# Patient Record
Sex: Male | Born: 1970 | Race: Black or African American | Hispanic: No | Marital: Single | Smoking: Former smoker
Health system: Southern US, Community
[De-identification: ages and names within clinical notes are randomized; demographics above are authoritative.]

## PROBLEM LIST (undated history)

## (undated) DIAGNOSIS — F32A Depression, unspecified: Secondary | ICD-10-CM

## (undated) DIAGNOSIS — F329 Major depressive disorder, single episode, unspecified: Secondary | ICD-10-CM

## (undated) DIAGNOSIS — R569 Unspecified convulsions: Secondary | ICD-10-CM

## (undated) DIAGNOSIS — F419 Anxiety disorder, unspecified: Secondary | ICD-10-CM

## (undated) HISTORY — PX: OTHER SURGICAL HISTORY: SHX169

---

## 2000-04-02 ENCOUNTER — Emergency Department (HOSPITAL_COMMUNITY): Admission: EM | Admit: 2000-04-02 | Discharge: 2000-04-02 | Payer: Self-pay | Admitting: Emergency Medicine

## 2000-10-02 ENCOUNTER — Encounter: Payer: Self-pay | Admitting: Emergency Medicine

## 2000-10-02 ENCOUNTER — Emergency Department (HOSPITAL_COMMUNITY): Admission: EM | Admit: 2000-10-02 | Discharge: 2000-10-02 | Payer: Self-pay | Admitting: Emergency Medicine

## 2001-01-09 ENCOUNTER — Emergency Department (HOSPITAL_COMMUNITY): Admission: EM | Admit: 2001-01-09 | Discharge: 2001-01-09 | Payer: Self-pay | Admitting: *Deleted

## 2001-02-20 ENCOUNTER — Emergency Department (HOSPITAL_COMMUNITY): Admission: EM | Admit: 2001-02-20 | Discharge: 2001-02-20 | Payer: Self-pay | Admitting: Emergency Medicine

## 2001-03-27 ENCOUNTER — Encounter: Payer: Self-pay | Admitting: Emergency Medicine

## 2001-03-27 ENCOUNTER — Emergency Department (HOSPITAL_COMMUNITY): Admission: EM | Admit: 2001-03-27 | Discharge: 2001-03-27 | Payer: Self-pay | Admitting: Emergency Medicine

## 2001-03-29 ENCOUNTER — Emergency Department (HOSPITAL_COMMUNITY): Admission: EM | Admit: 2001-03-29 | Discharge: 2001-03-29 | Payer: Self-pay | Admitting: Emergency Medicine

## 2001-04-01 ENCOUNTER — Emergency Department (HOSPITAL_COMMUNITY): Admission: EM | Admit: 2001-04-01 | Discharge: 2001-04-01 | Payer: Self-pay | Admitting: Emergency Medicine

## 2001-04-07 ENCOUNTER — Emergency Department (HOSPITAL_COMMUNITY): Admission: EM | Admit: 2001-04-07 | Discharge: 2001-04-07 | Payer: Self-pay | Admitting: Emergency Medicine

## 2001-04-18 ENCOUNTER — Emergency Department (HOSPITAL_COMMUNITY): Admission: EM | Admit: 2001-04-18 | Discharge: 2001-04-18 | Payer: Self-pay | Admitting: Emergency Medicine

## 2001-04-27 ENCOUNTER — Emergency Department (HOSPITAL_COMMUNITY): Admission: EM | Admit: 2001-04-27 | Discharge: 2001-04-27 | Payer: Self-pay | Admitting: Emergency Medicine

## 2001-04-28 ENCOUNTER — Emergency Department (HOSPITAL_COMMUNITY): Admission: EM | Admit: 2001-04-28 | Discharge: 2001-04-28 | Payer: Self-pay | Admitting: Emergency Medicine

## 2002-01-31 ENCOUNTER — Emergency Department (HOSPITAL_COMMUNITY): Admission: EM | Admit: 2002-01-31 | Discharge: 2002-01-31 | Payer: Self-pay | Admitting: Emergency Medicine

## 2002-02-07 ENCOUNTER — Emergency Department (HOSPITAL_COMMUNITY): Admission: EM | Admit: 2002-02-07 | Discharge: 2002-02-07 | Payer: Self-pay | Admitting: Emergency Medicine

## 2002-06-17 ENCOUNTER — Emergency Department (HOSPITAL_COMMUNITY): Admission: EM | Admit: 2002-06-17 | Discharge: 2002-06-17 | Payer: Self-pay | Admitting: *Deleted

## 2002-07-14 ENCOUNTER — Emergency Department (HOSPITAL_COMMUNITY): Admission: EM | Admit: 2002-07-14 | Discharge: 2002-07-14 | Payer: Self-pay | Admitting: Emergency Medicine

## 2002-07-31 ENCOUNTER — Emergency Department (HOSPITAL_COMMUNITY): Admission: EM | Admit: 2002-07-31 | Discharge: 2002-07-31 | Payer: Self-pay

## 2003-07-28 ENCOUNTER — Emergency Department (HOSPITAL_COMMUNITY): Admission: EM | Admit: 2003-07-28 | Discharge: 2003-07-28 | Payer: Self-pay | Admitting: Emergency Medicine

## 2003-12-04 ENCOUNTER — Emergency Department (HOSPITAL_COMMUNITY): Admission: EM | Admit: 2003-12-04 | Discharge: 2003-12-04 | Payer: Self-pay | Admitting: Emergency Medicine

## 2003-12-22 ENCOUNTER — Emergency Department (HOSPITAL_COMMUNITY): Admission: EM | Admit: 2003-12-22 | Discharge: 2003-12-22 | Payer: Self-pay | Admitting: Family Medicine

## 2004-09-04 ENCOUNTER — Emergency Department (HOSPITAL_COMMUNITY): Admission: EM | Admit: 2004-09-04 | Discharge: 2004-09-05 | Payer: Self-pay | Admitting: Emergency Medicine

## 2004-11-26 ENCOUNTER — Emergency Department (HOSPITAL_COMMUNITY): Admission: EM | Admit: 2004-11-26 | Discharge: 2004-11-26 | Payer: Self-pay | Admitting: Emergency Medicine

## 2005-02-04 ENCOUNTER — Emergency Department (HOSPITAL_COMMUNITY): Admission: EM | Admit: 2005-02-04 | Discharge: 2005-02-04 | Payer: Self-pay | Admitting: Emergency Medicine

## 2005-10-26 ENCOUNTER — Emergency Department (HOSPITAL_COMMUNITY): Admission: EM | Admit: 2005-10-26 | Discharge: 2005-10-26 | Payer: Self-pay | Admitting: Emergency Medicine

## 2007-03-29 ENCOUNTER — Emergency Department (HOSPITAL_COMMUNITY): Admission: EM | Admit: 2007-03-29 | Discharge: 2007-03-29 | Payer: Self-pay | Admitting: Emergency Medicine

## 2007-08-31 ENCOUNTER — Emergency Department (HOSPITAL_COMMUNITY): Admission: EM | Admit: 2007-08-31 | Discharge: 2007-08-31 | Payer: Self-pay | Admitting: Emergency Medicine

## 2007-12-26 ENCOUNTER — Emergency Department (HOSPITAL_COMMUNITY): Admission: EM | Admit: 2007-12-26 | Discharge: 2007-12-26 | Payer: Self-pay | Admitting: Emergency Medicine

## 2008-06-05 ENCOUNTER — Emergency Department (HOSPITAL_COMMUNITY): Admission: EM | Admit: 2008-06-05 | Discharge: 2008-06-05 | Payer: Self-pay | Admitting: Emergency Medicine

## 2008-06-08 ENCOUNTER — Emergency Department (HOSPITAL_COMMUNITY): Admission: EM | Admit: 2008-06-08 | Discharge: 2008-06-08 | Payer: Self-pay | Admitting: Emergency Medicine

## 2008-06-11 ENCOUNTER — Emergency Department (HOSPITAL_COMMUNITY): Admission: EM | Admit: 2008-06-11 | Discharge: 2008-06-11 | Payer: Self-pay | Admitting: Family Medicine

## 2010-03-12 ENCOUNTER — Emergency Department (HOSPITAL_COMMUNITY): Admission: EM | Admit: 2010-03-12 | Discharge: 2010-03-12 | Payer: Self-pay | Admitting: Emergency Medicine

## 2010-07-29 ENCOUNTER — Emergency Department (HOSPITAL_COMMUNITY): Admission: EM | Admit: 2010-07-29 | Discharge: 2010-07-29 | Payer: Self-pay | Admitting: Emergency Medicine

## 2010-08-08 ENCOUNTER — Emergency Department (HOSPITAL_COMMUNITY): Admission: EM | Admit: 2010-08-08 | Discharge: 2010-08-08 | Payer: Self-pay | Admitting: Emergency Medicine

## 2010-11-18 ENCOUNTER — Emergency Department (HOSPITAL_COMMUNITY)
Admission: EM | Admit: 2010-11-18 | Discharge: 2010-11-18 | Payer: Self-pay | Source: Home / Self Care | Admitting: Emergency Medicine

## 2011-01-05 ENCOUNTER — Emergency Department (HOSPITAL_COMMUNITY)
Admission: EM | Admit: 2011-01-05 | Discharge: 2011-01-05 | Disposition: A | Discharge status: Home / Self Care | Payer: Medicare Other | Attending: Emergency Medicine | Admitting: Emergency Medicine

## 2011-01-05 DIAGNOSIS — G40909 Epilepsy, unspecified, not intractable, without status epilepticus: Secondary | ICD-10-CM | POA: Insufficient documentation

## 2011-01-05 LAB — CBC
MCH: 29.6 pg (ref 26.0–34.0)
MCHC: 34.8 g/dL (ref 30.0–36.0)
Platelets: 223 10*3/uL (ref 150–400)
RDW: 13.9 % (ref 11.5–15.5)

## 2011-01-05 LAB — COMPREHENSIVE METABOLIC PANEL
ALT: 21 U/L (ref 0–53)
AST: 19 U/L (ref 0–37)
Albumin: 3.9 g/dL (ref 3.5–5.2)
CO2: 26 mEq/L (ref 19–32)
Calcium: 9.5 mg/dL (ref 8.4–10.5)
Creatinine, Ser: 0.97 mg/dL (ref 0.4–1.5)
GFR calc Af Amer: >60 mL/min (ref >60)
GFR calc non Af Amer: >60 mL/min (ref >60)
Sodium: 136 mEq/L (ref 135–145)
Total Protein: 7.4 g/dL (ref 6.0–8.3)

## 2011-01-05 LAB — URINALYSIS, ROUTINE W REFLEX MICROSCOPIC
Ketones, ur: NEGATIVE mg/dL
Leukocytes, UA: NEGATIVE
Protein, ur: NEGATIVE mg/dL
Urine Glucose, Fasting: NEGATIVE mg/dL
Urobilinogen, UA: 0.2 mg/dL (ref 0.0–1.0)

## 2011-01-05 LAB — DIFFERENTIAL
Basophils Absolute: 0 10*3/uL (ref 0.0–0.1)
Basophils Relative: 0 % (ref 0–1)
Eosinophils Absolute: 0.2 10*3/uL (ref 0.0–0.7)
Eosinophils Relative: 2 % (ref 0–5)
Monocytes Absolute: 0.6 10*3/uL (ref 0.1–1.0)
Monocytes Relative: 6 % (ref 3–12)
Neutro Abs: 6.8 10*3/uL (ref 1.7–7.7)

## 2011-01-05 LAB — RAPID URINE DRUG SCREEN, HOSP PERFORMED
Amphetamines: NOT DETECTED
Benzodiazepines: NOT DETECTED
Cocaine: NOT DETECTED

## 2011-01-07 ENCOUNTER — Emergency Department (HOSPITAL_COMMUNITY): Payer: Medicare Other

## 2011-01-07 ENCOUNTER — Emergency Department (HOSPITAL_COMMUNITY)
Admission: EM | Admit: 2011-01-07 | Discharge: 2011-01-07 | Disposition: A | Discharge status: Home / Self Care | Payer: Medicare Other | Attending: Emergency Medicine | Admitting: Emergency Medicine

## 2011-01-07 DIAGNOSIS — F329 Major depressive disorder, single episode, unspecified: Secondary | ICD-10-CM | POA: Insufficient documentation

## 2011-01-07 DIAGNOSIS — F3289 Other specified depressive episodes: Secondary | ICD-10-CM | POA: Insufficient documentation

## 2011-01-07 DIAGNOSIS — G40909 Epilepsy, unspecified, not intractable, without status epilepticus: Secondary | ICD-10-CM | POA: Insufficient documentation

## 2011-01-07 DIAGNOSIS — R319 Hematuria, unspecified: Secondary | ICD-10-CM | POA: Insufficient documentation

## 2011-01-07 LAB — POCT I-STAT, CHEM 8
BUN: 15 mg/dL (ref 6–23)
Creatinine, Ser: 1.1 mg/dL (ref 0.4–1.5)
Glucose, Bld: 83 mg/dL (ref 70–99)
Potassium: 7.8 mEq/L (ref 3.5–5.1)
Sodium: 135 mEq/L (ref 135–145)

## 2011-01-07 LAB — URINALYSIS, ROUTINE W REFLEX MICROSCOPIC
Bilirubin Urine: NEGATIVE
Ketones, ur: NEGATIVE mg/dL
Protein, ur: NEGATIVE mg/dL
Urine Glucose, Fasting: NEGATIVE mg/dL

## 2011-01-07 LAB — RAPID URINE DRUG SCREEN, HOSP PERFORMED
Amphetamines: NOT DETECTED
Barbiturates: NOT DETECTED
Benzodiazepines: NOT DETECTED
Opiates: NOT DETECTED

## 2011-01-07 LAB — PHENYTOIN LEVEL, TOTAL: Phenytoin Lvl: 9.6 ug/mL — ABNORMAL LOW (ref 10.0–20.0)

## 2011-01-07 LAB — POTASSIUM: Potassium: 3.8 mEq/L (ref 3.5–5.1)

## 2011-02-01 LAB — POCT I-STAT, CHEM 8
Calcium, Ion: 1.19 mmol/L (ref 1.12–1.32)
Chloride: 111 mEq/L (ref 96–112)
Creatinine, Ser: 1.1 mg/dL (ref 0.4–1.5)
Glucose, Bld: 88 mg/dL (ref 70–99)
HCT: 46 % (ref 39.0–52.0)
Hemoglobin: 15.6 g/dL (ref 13.0–17.0)
Potassium: 4.1 mEq/L (ref 3.5–5.1)
Sodium: 140 mEq/L (ref 135–145)

## 2011-02-01 LAB — CBC
Hemoglobin: 14.7 g/dL (ref 13.0–17.0)
MCHC: 33.6 g/dL (ref 30.0–36.0)
RBC: 5.04 MIL/uL (ref 4.22–5.81)
WBC: 9 10*3/uL (ref 4.0–10.5)

## 2011-02-01 LAB — PHENYTOIN LEVEL, TOTAL: Phenytoin Lvl: 12.8 ug/mL (ref 10.0–20.0)

## 2011-02-04 LAB — POCT I-STAT, CHEM 8
Calcium, Ion: 1.24 mmol/L (ref 1.12–1.32)
Chloride: 109 mEq/L (ref 96–112)
Creatinine, Ser: 0.9 mg/dL (ref 0.4–1.5)
Glucose, Bld: 89 mg/dL (ref 70–99)
HCT: 44 % (ref 39.0–52.0)
Potassium: 4.7 mEq/L (ref 3.5–5.1)

## 2011-08-28 ENCOUNTER — Emergency Department (HOSPITAL_COMMUNITY)
Admission: EM | Admit: 2011-08-28 | Discharge: 2011-08-28 | Disposition: A | Discharge status: Home / Self Care | Payer: Medicare Other | Attending: Emergency Medicine | Admitting: Emergency Medicine

## 2011-08-28 ENCOUNTER — Emergency Department (HOSPITAL_COMMUNITY): Payer: Medicare Other

## 2011-08-28 DIAGNOSIS — W1809XA Striking against other object with subsequent fall, initial encounter: Secondary | ICD-10-CM | POA: Insufficient documentation

## 2011-08-28 DIAGNOSIS — S0100XA Unspecified open wound of scalp, initial encounter: Secondary | ICD-10-CM | POA: Insufficient documentation

## 2011-08-28 DIAGNOSIS — M542 Cervicalgia: Secondary | ICD-10-CM | POA: Insufficient documentation

## 2011-08-28 DIAGNOSIS — R569 Unspecified convulsions: Secondary | ICD-10-CM | POA: Insufficient documentation

## 2011-08-28 DIAGNOSIS — R51 Headache: Secondary | ICD-10-CM | POA: Insufficient documentation

## 2011-08-28 DIAGNOSIS — S0990XA Unspecified injury of head, initial encounter: Secondary | ICD-10-CM | POA: Insufficient documentation

## 2011-08-28 LAB — CBC
MCH: 29.2 pg (ref 26.0–34.0)
MCHC: 34.4 g/dL (ref 30.0–36.0)
MCV: 85 fL (ref 78.0–100.0)
Platelets: 234 10*3/uL (ref 150–400)
RBC: 4.93 MIL/uL (ref 4.22–5.81)
RDW: 14.1 % (ref 11.5–15.5)

## 2011-08-28 LAB — POCT I-STAT, CHEM 8
Creatinine, Ser: 1.1 mg/dL (ref 0.50–1.35)
Glucose, Bld: 87 mg/dL (ref 70–99)
Hemoglobin: 15.3 g/dL (ref 13.0–17.0)
Potassium: 4.1 mEq/L (ref 3.5–5.1)

## 2011-08-28 LAB — DIFFERENTIAL
Eosinophils Absolute: 0.4 10*3/uL (ref 0.0–0.7)
Eosinophils Relative: 4 % (ref 0–5)
Lymphs Abs: 3.4 10*3/uL (ref 0.7–4.0)
Monocytes Absolute: 0.8 10*3/uL (ref 0.1–1.0)
Monocytes Relative: 7 % (ref 3–12)

## 2011-09-06 ENCOUNTER — Emergency Department (HOSPITAL_COMMUNITY)
Admission: EM | Admit: 2011-09-06 | Discharge: 2011-09-06 | Disposition: A | Discharge status: Home / Self Care | Payer: Medicare Other | Attending: Emergency Medicine | Admitting: Emergency Medicine

## 2011-09-06 DIAGNOSIS — G40909 Epilepsy, unspecified, not intractable, without status epilepticus: Secondary | ICD-10-CM | POA: Insufficient documentation

## 2011-09-06 DIAGNOSIS — S0100XA Unspecified open wound of scalp, initial encounter: Secondary | ICD-10-CM | POA: Insufficient documentation

## 2011-09-06 DIAGNOSIS — X58XXXA Exposure to other specified factors, initial encounter: Secondary | ICD-10-CM | POA: Insufficient documentation

## 2011-09-06 DIAGNOSIS — Z79899 Other long term (current) drug therapy: Secondary | ICD-10-CM | POA: Insufficient documentation

## 2011-09-06 DIAGNOSIS — F3289 Other specified depressive episodes: Secondary | ICD-10-CM | POA: Insufficient documentation

## 2011-09-06 DIAGNOSIS — F329 Major depressive disorder, single episode, unspecified: Secondary | ICD-10-CM | POA: Insufficient documentation

## 2011-09-06 DIAGNOSIS — Z09 Encounter for follow-up examination after completed treatment for conditions other than malignant neoplasm: Secondary | ICD-10-CM | POA: Insufficient documentation

## 2012-01-14 DIAGNOSIS — G40119 Localization-related (focal) (partial) symptomatic epilepsy and epileptic syndromes with simple partial seizures, intractable, without status epilepticus: Secondary | ICD-10-CM | POA: Diagnosis not present

## 2012-01-14 DIAGNOSIS — G40309 Generalized idiopathic epilepsy and epileptic syndromes, not intractable, without status epilepticus: Secondary | ICD-10-CM | POA: Diagnosis not present

## 2012-01-14 DIAGNOSIS — Z79899 Other long term (current) drug therapy: Secondary | ICD-10-CM | POA: Diagnosis not present

## 2012-01-14 DIAGNOSIS — G40209 Localization-related (focal) (partial) symptomatic epilepsy and epileptic syndromes with complex partial seizures, not intractable, without status epilepticus: Secondary | ICD-10-CM | POA: Diagnosis not present

## 2012-01-17 DIAGNOSIS — G40219 Localization-related (focal) (partial) symptomatic epilepsy and epileptic syndromes with complex partial seizures, intractable, without status epilepticus: Secondary | ICD-10-CM | POA: Diagnosis not present

## 2012-01-17 DIAGNOSIS — Z79899 Other long term (current) drug therapy: Secondary | ICD-10-CM | POA: Diagnosis not present

## 2012-01-17 DIAGNOSIS — G40119 Localization-related (focal) (partial) symptomatic epilepsy and epileptic syndromes with simple partial seizures, intractable, without status epilepticus: Secondary | ICD-10-CM | POA: Diagnosis not present

## 2012-01-17 DIAGNOSIS — G40309 Generalized idiopathic epilepsy and epileptic syndromes, not intractable, without status epilepticus: Secondary | ICD-10-CM | POA: Diagnosis not present

## 2012-01-17 DIAGNOSIS — G40209 Localization-related (focal) (partial) symptomatic epilepsy and epileptic syndromes with complex partial seizures, not intractable, without status epilepticus: Secondary | ICD-10-CM | POA: Diagnosis not present

## 2012-01-31 DIAGNOSIS — F0630 Mood disorder due to known physiological condition, unspecified: Secondary | ICD-10-CM | POA: Diagnosis not present

## 2012-02-07 DIAGNOSIS — R3129 Other microscopic hematuria: Secondary | ICD-10-CM | POA: Diagnosis not present

## 2012-05-15 ENCOUNTER — Emergency Department (HOSPITAL_COMMUNITY): Payer: Medicare Other

## 2012-05-15 ENCOUNTER — Emergency Department (HOSPITAL_COMMUNITY)
Admission: EM | Admit: 2012-05-15 | Discharge: 2012-05-15 | Disposition: A | Discharge status: Home / Self Care | Payer: Medicare Other | Attending: Emergency Medicine | Admitting: Emergency Medicine

## 2012-05-15 ENCOUNTER — Encounter (HOSPITAL_COMMUNITY): Payer: Self-pay | Admitting: Emergency Medicine

## 2012-05-15 DIAGNOSIS — R569 Unspecified convulsions: Secondary | ICD-10-CM | POA: Diagnosis not present

## 2012-05-15 DIAGNOSIS — Z79899 Other long term (current) drug therapy: Secondary | ICD-10-CM | POA: Diagnosis not present

## 2012-05-15 DIAGNOSIS — S0100XA Unspecified open wound of scalp, initial encounter: Secondary | ICD-10-CM | POA: Diagnosis not present

## 2012-05-15 DIAGNOSIS — Y92009 Unspecified place in unspecified non-institutional (private) residence as the place of occurrence of the external cause: Secondary | ICD-10-CM | POA: Insufficient documentation

## 2012-05-15 DIAGNOSIS — W19XXXA Unspecified fall, initial encounter: Secondary | ICD-10-CM | POA: Insufficient documentation

## 2012-05-15 DIAGNOSIS — R561 Post traumatic seizures: Secondary | ICD-10-CM | POA: Diagnosis not present

## 2012-05-15 DIAGNOSIS — G40909 Epilepsy, unspecified, not intractable, without status epilepticus: Secondary | ICD-10-CM | POA: Diagnosis not present

## 2012-05-15 DIAGNOSIS — T148XXA Other injury of unspecified body region, initial encounter: Secondary | ICD-10-CM | POA: Diagnosis not present

## 2012-05-15 DIAGNOSIS — R220 Localized swelling, mass and lump, head: Secondary | ICD-10-CM | POA: Diagnosis not present

## 2012-05-15 DIAGNOSIS — R51 Headache: Secondary | ICD-10-CM | POA: Diagnosis not present

## 2012-05-15 DIAGNOSIS — S0101XA Laceration without foreign body of scalp, initial encounter: Secondary | ICD-10-CM

## 2012-05-15 DIAGNOSIS — R221 Localized swelling, mass and lump, neck: Secondary | ICD-10-CM | POA: Diagnosis not present

## 2012-05-15 HISTORY — DX: Unspecified convulsions: R56.9

## 2012-05-15 LAB — COMPREHENSIVE METABOLIC PANEL
BUN: 11 mg/dL (ref 6–23)
Calcium: 9.7 mg/dL (ref 8.4–10.5)
Creatinine, Ser: 0.98 mg/dL (ref 0.50–1.35)
GFR calc Af Amer: >90 mL/min (ref >90)
Glucose, Bld: 90 mg/dL (ref 70–99)
Sodium: 137 mEq/L (ref 135–145)
Total Protein: 8.1 g/dL (ref 6.0–8.3)

## 2012-05-15 LAB — CBC
MCH: 28.8 pg (ref 26.0–34.0)
MCV: 84.8 fL (ref 78.0–100.0)
Platelets: 228 10*3/uL (ref 150–400)
RBC: 5.28 MIL/uL (ref 4.22–5.81)

## 2012-05-15 LAB — ETHANOL: Alcohol, Ethyl (B): <11 mg/dL (ref 0–11)

## 2012-05-15 LAB — DIFFERENTIAL
Eosinophils Absolute: 0.2 10*3/uL (ref 0.0–0.7)
Eosinophils Relative: 3 % (ref 0–5)
Lymphs Abs: 2.1 10*3/uL (ref 0.7–4.0)
Monocytes Relative: 9 % (ref 3–12)

## 2012-05-15 LAB — CARBAMAZEPINE LEVEL, TOTAL: Carbamazepine Lvl: <0.5 ug/mL — ABNORMAL LOW (ref 4.0–12.0)

## 2012-05-15 LAB — PHENYTOIN LEVEL, TOTAL: Phenytoin Lvl: 20.8 ug/mL — ABNORMAL HIGH (ref 10.0–20.0)

## 2012-05-15 MED ORDER — LIDOCAINE-EPINEPHRINE 2 %-1:100000 IJ SOLN
20.0000 mL | Freq: Once | INTRAMUSCULAR | Status: AC
  Administered 2012-05-15: 20 mL via INTRADERMAL
  Filled 2012-05-15: qty 20

## 2012-05-15 NOTE — ED Provider Notes (Signed)
History     CSN: [id number]  Arrival date & time 05/15/12  1212   First MD Initiated Contact with Patient 05/15/12 1220      Chief Complaint  Patient presents with  . Seizures     HPI Patient presents to emergency room after having a seizure. He has a history of seizure disorder and states he takes several medications. Patient also wears a seizure home but unfortunately he was not wearing that today. He was at a gas station when a witness the patient fall down and began having generalized shaking. Patient sustained a laceration the back of his head. He initially was confused but has become more alert as time has progressed.  He states he has a couple of seizures daily. Denies missing any medications however cannot tell me the name of them at this point. Denies any headache, neck pain back pain chest pain or abdominal pain. He denies any recent fevers coughing or illness. Past Medical History  Diagnosis Date  . Seizures     No past surgical history on file.  History reviewed. No pertinent family history.  History  Substance Use Topics  . Smoking status: Not on file  . Smokeless tobacco: Not on file  . Alcohol Use: No      Review of Systems  All other systems reviewed and are negative.    Allergies  Review of patient's allergies indicates no known allergies.  Home Medications  No current outpatient prescriptions on file.  BP 134/77  Pulse 66  Temp 98.4 F (36.9 C) (Oral)  Resp 16  SpO2 96%  Physical Exam  Nursing note and vitals reviewed. Constitutional: He appears well-developed and well-nourished. No distress.  HENT:  Head: Normocephalic and atraumatic.    Right Ear: External ear normal.  Left Ear: External ear normal.       Cephalhematoma and 2 lacerations posterior scalp  Eyes: Conjunctivae are normal. Right eye exhibits no discharge. Left eye exhibits no discharge. No scleral icterus.  Neck: Neck supple. No tracheal deviation present.    Cardiovascular: Normal rate, regular rhythm and intact distal pulses.   Pulmonary/Chest: Effort normal and breath sounds normal. No stridor. No respiratory distress. He has no wheezes. He has no rales.  Abdominal: Soft. Bowel sounds are normal. He exhibits no distension. There is no tenderness. There is no rebound and no guarding.  Musculoskeletal: He exhibits no edema and no tenderness.       Cervical back: Normal.       Thoracic back: Normal.       Lumbar back: Normal.  Neurological: He is alert. He has normal strength. No sensory deficit. Cranial nerve deficit:  no gross defecits noted. He exhibits normal muscle tone. He displays no seizure activity. Coordination normal.  Skin: Skin is warm and dry. No rash noted.  Psychiatric: He has a normal mood and affect.    ED Course  LACERATION REPAIR Performed by: Celene Kras Authorized by: Linwood Dibbles R Consent: Verbal consent obtained. Consent given by: patient Body area: head/neck Location details: scalp Laceration length: 8 (2 lacerations, 4 cm each) cm Foreign bodies: no foreign bodies Tendon involvement: none Nerve involvement: none Vascular damage: no Anesthesia: local infiltration Local anesthetic: lidocaine 2% with epinephrine Anesthetic total: 6 ml Patient sedated: no Preparation: Patient was prepped and draped in the usual sterile fashion. Irrigation solution: saline Amount of cleaning: standard Debridement: none Skin closure: staples Number of sutures: 12   (including critical care time)  Labs Reviewed  COMPREHENSIVE METABOLIC PANEL - Abnormal; Notable for the following:    Alkaline Phosphatase 127 (*)     All other components within normal limits  PHENYTOIN LEVEL, TOTAL - Abnormal; Notable for the following:    Phenytoin Lvl 20.8 (*)     All other components within normal limits  CARBAMAZEPINE LEVEL, TOTAL - Abnormal; Notable for the following:    Carbamazepine Lvl <0.5 (*)     All other components within  normal limits  GLUCOSE, CAPILLARY - Abnormal; Notable for the following:    Glucose-Capillary 103 (*)     All other components within normal limits  CBC  DIFFERENTIAL  ETHANOL  URINE RAPID DRUG SCREEN (HOSP PERFORMED)  VALPROIC ACID LEVEL   Ct Head Wo Contrast  05/15/2012  *RADIOLOGY REPORT*  Clinical Data: Seizure, headache.  CT HEAD WITHOUT CONTRAST  Technique:  Contiguous axial images were obtained from the base of the skull through the vertex without contrast.  Comparison: 08/28/2011  Findings: Soft tissue swelling over the right occipital region. Small amount of air within the scalp soft tissues.  No underlying calvarial abnormality. No acute intracranial abnormality. Specifically, no hemorrhage, hydrocephalus, mass lesion, acute infarction, or significant intracranial injury.  No acute calvarial abnormality. Visualized paranasal sinuses and mastoids clear. Orbital soft tissues unremarkable.  IMPRESSION: Soft tissue swelling and soft tissue gas within the right occipital soft tissues.  No intracranial abnormality.  Original Report Authenticated By: Cyndie Chime, M.D.      MDM  I've been able to review old records. Patient appears to be taking Dilantin and keppra according to a previous ED visit.  His Dilantin level appears to be therapeutic at this time. He's been monitored in the emergency department for several hours and has not had further seizures. Patient's laceration has been repaired. At this point he appears medically stable. At this time there does not appear to be any evidence of an acute emergency medical condition and the patient appears stable for discharge with appropriate outpatient follow up. I encouraged the patient to follow up with his physician to discuss his seizure medications.         Celene Kras, MD 05/15/12 [phone]

## 2012-05-15 NOTE — ED Notes (Signed)
Pt was fecal incontinent today.

## 2012-05-15 NOTE — ED Notes (Signed)
Pt had seizure today. Has them frequently and has usually a helmet. Pt has seizure and fell at gas station parking lot. Pt has 2 lacerations on back of his head. Headache. More oriented as the transport progressed per EMS

## 2012-05-15 NOTE — ED Notes (Signed)
Staples placed by EDP for lacerations to back of head.

## 2012-05-15 NOTE — ED Notes (Signed)
Patient transported to CT 

## 2012-05-15 NOTE — ED Notes (Signed)
Pt. Is still not able to use the restroom at this time.

## 2012-05-15 NOTE — Discharge Instructions (Signed)
Laceration Care, Adult A laceration is a cut or lesion that goes through all layers of the skin and into the tissue just beneath the skin. TREATMENT  Some lacerations may not require closure. Some lacerations may not be able to be closed due to an increased risk of infection. It is important to see your caregiver as soon as possible after an injury to minimize the risk of infection and maximize the opportunity for successful closure. If closure is appropriate, pain medicines may be given, if needed. The wound will be cleaned to help prevent infection. Your caregiver will use stitches (sutures), staples, wound glue (adhesive), or skin adhesive strips to repair the laceration. These tools bring the skin edges together to allow for faster healing and a better cosmetic outcome. However, all wounds will heal with a scar. Once the wound has healed, scarring can be minimized by covering the wound with sunscreen during the day for 1 full year. HOME CARE INSTRUCTIONS  For sutures or staples:  Keep the wound clean and dry.   If you were given a bandage (dressing), you should change it at least once a day. Also, change the dressing if it becomes wet or dirty, or as directed by your caregiver.   Wash the wound with soap and water 2 times a day. Rinse the wound off with water to remove all soap. Pat the wound dry with a clean towel.   After cleaning, apply a thin layer of the antibiotic ointment as recommended by your caregiver. This will help prevent infection and keep the dressing from sticking.   You may shower as usual after the first 24 hours. Do not soak the wound in water until the sutures are removed.   Only take over-the-counter or prescription medicines for pain, discomfort, or fever as directed by your caregiver.   Get your sutures or staples removed as directed by your caregiver.  For skin adhesive strips:  Keep the wound clean and dry.   Do not get the skin adhesive strips wet. You may bathe  carefully, using caution to keep the wound dry.   If the wound gets wet, pat it dry with a clean towel.   Skin adhesive strips will fall off on their own. You may trim the strips as the wound heals. Do not remove skin adhesive strips that are still stuck to the wound. They will fall off in time.  For wound adhesive:  You may briefly wet your wound in the shower or bath. Do not soak or scrub the wound. Do not swim. Avoid periods of heavy perspiration until the skin adhesive has fallen off on its own. After showering or bathing, gently pat the wound dry with a clean towel.   Do not apply liquid medicine, cream medicine, or ointment medicine to your wound while the skin adhesive is in place. This may loosen the film before your wound is healed.   If a dressing is placed over the wound, be careful not to apply tape directly over the skin adhesive. This may cause the adhesive to be pulled off before the wound is healed.   Avoid prolonged exposure to sunlight or tanning lamps while the skin adhesive is in place. Exposure to ultraviolet light in the first year will darken the scar.   The skin adhesive will usually remain in place for 5 to 10 days, then naturally fall off the skin. Do not pick at the adhesive film.  You may need a tetanus shot if:  You   cannot remember when you had your last tetanus shot.   You have never had a tetanus shot.  If you get a tetanus shot, your arm may swell, get red, and feel warm to the touch. This is common and not a problem. If you need a tetanus shot and you choose not to have one, there is a rare chance of getting tetanus. Sickness from tetanus can be serious. SEEK MEDICAL CARE IF:   You have redness, swelling, or increasing pain in the wound.   You see a red line that goes away from the wound.   You have yellowish-white fluid (pus) coming from the wound.   You have a fever.   You notice a bad smell coming from the wound or dressing.   Your wound breaks  open before or after sutures have been removed.   You notice something coming out of the wound such as wood or glass.   Your wound is on your hand or foot and you cannot move a finger or toe.  SEEK IMMEDIATE MEDICAL CARE IF:   Your pain is not controlled with prescribed medicine.   You have severe swelling around the wound causing pain and numbness or a change in color in your arm, hand, leg, or foot.   Your wound splits open and starts bleeding.   You have worsening numbness, weakness, or loss of function of any joint around or beyond the wound.   You develop painful lumps near the wound or on the skin anywhere on your body.  MAKE SURE YOU:   Understand these instructions.   Will watch your condition.   Will get help right away if you are not doing well or get worse.  Document Released: 11/08/2005 Document Revised: 10/28/2011 Document Reviewed: 05/04/2011 [facility] Patient Information 2012 [place], [place].Epilepsy A seizure (convulsion) is a sudden change in brain function that causes a change in behavior, muscle activity, or ability to remain awake and alert. If a person has recurring seizures, this is called epilepsy. CAUSES  Epilepsy is a disorder with many possible causes. Anything that disturbs the normal pattern of brain cell activity can lead to seizures. Seizure can be caused from illness to brain damage to abnormal brain development. Epilepsy may develop because of:  An abnormality in brain wiring.   An imbalance of nerve signaling chemicals (neurotransmitters).   Some combination of these factors.  Scientists are learning an increasing amount about genetic causes of seizures. SYMPTOMS  The symptoms of a seizure can vary greatly from one person to another. These may include:  An aura, or warning that tells a person they are about to have a seizure.   Abnormal sensations, such as abnormal smell or seeing flashing lights.   Sudden, general body stiffness.   Rhythmic  jerking of the face, arm, or leg - on one or both sides.   Sudden change in consciousness.   The person may appear to be awake but not responding.   They may appear to be asleep but cannot be awakened.   Grimacing, chewing, lip smacking, or drooling.   Often there is a period of sleepiness after a seizure.  DIAGNOSIS  The description you give to your caregiver about what you experienced will help them understand your problems. Equally important is the description by any witnesses to your seizure. A physical exam, including a detailed neurological exam, is necessary. An EEG (electroencephalogram) is a painless test of your brain waves. In this test a diagram is created of  your brain waves. These diagrams can be interpreted by a specialist. Pictures of your brain are usually taken with:  An MRI.   A CT scan.  Lab tests may be done to look for:  Signs of infection.   Abnormal blood chemistry.  PREVENTION  There is no way to prevent the development of epilepsy. If you have seizures that are typically triggered by an event (such as flashing lights), try to avoid the trigger. This can help you avoid a seizure.  PROGNOSIS  Most people with epilepsy lead outwardly normal lives. While epilepsy cannot currently be cured, for some people it does eventually go away. Most seizures do not cause brain damage. It is not uncommon for people with epilepsy, especially children, to develop behavioral and emotional problems. These problems are sometimes the consequence of medicine for seizures or social stress. For some people with epilepsy, the risk of seizures restricts their independence and recreational activities. For example, some states refuse drivers licenses to people with epilepsy. Most women with epilepsy can become pregnant. They should discuss their epilepsy and the medicine they are taking with their caregivers. Women with epilepsy have a 90 percent or better chance of having a normal, healthy  baby. RISKS AND COMPLICATIONS  People with epilepsy are at increased risk of falls, accidents, and injuries. People with epilepsy are at special risk for two life-threatening conditions. These are status epilepticus and sudden unexplained death (extremely rare). Status epilepticus is a long lasting, continuous seizure that is a medical emergency. TREATMENT  Once epilepsy is diagnosed, it is important to begin treatment as soon as possible. For about 80 percent of those diagnosed with epilepsy, seizures can be controlled with modern medicines and surgical techniques. Some antiepileptic drugs can interfere with the effectiveness of oral contraceptives. In 1997, the FDA approved a pacemaker for the brain the (vagus nerve stimulator). This stimulator can be used for people with seizures that are not well-controlled by medicine. Studies have shown that in some cases, children may experience fewer seizures if they maintain a strict diet. The strict diet is called the ketogenic diet. This diet is rich in fats and low in carbohydrates. HOME CARE INSTRUCTIONS   Your caregiver will make recommendations about driving and safety in normal activities. Follow these carefully.   Take any medicine prescribed exactly as directed.   Do any blood tests requested to monitor the levels of your medicine.   The people you live and work with should know that you are prone to seizures. They should receive instructions on how to help you. In general, a witness to a seizure should:   Cushion your head and body.   Turn you on your side.   Avoid unnecessarily restraining you.   Not place anything inside your mouth.   Call for local emergency medical help if there is any question about what has occurred.   Keep a seizure diary. Record what you recall about any seizure, especially any possible trigger.   If your caregiver has given you a follow-up appointment, it is very important to keep that appointment. Not keeping  the appointment could result in permanent injury and disability. If there is any problem keeping the appointment, you must call back to this facility for assistance.  SEEK MEDICAL CARE IF:   You develop signs of infection or other illness. This might increase the risk of a seizure.   You seem to be having more frequent seizures.   Your seizure pattern is changing.  SEEK  IMMEDIATE MEDICAL CARE IF:   A seizure does not stop after a few moments.   A seizure causes any difficulty in breathing.   A seizure results in a very severe headache.   A seizure leaves you with the inability to speak or use a part of your body.  MAKE SURE YOU:   Understand these instructions.   Will watch your condition.   Will get help right away if you are not doing well or get worse.  Document Released: 11/08/2005 Document Revised: 10/28/2011 Document Reviewed: 06/14/2008 [facility] Patient Information 2012 [place], [place].

## 2012-05-15 NOTE — ED Notes (Signed)
XBJ:[id number]<NF> Expected date:05/15/12<BR> Expected time:11:54 AM<BR> Means of arrival:Ambulance<BR> Comments:<BR> 41yoM, seizure, post-ictal

## 2012-05-16 LAB — VALPROIC ACID LEVEL: Valproic Acid Lvl: <10 ug/mL — ABNORMAL LOW (ref 50.0–100.0)

## 2012-05-29 ENCOUNTER — Encounter (HOSPITAL_COMMUNITY): Payer: Self-pay | Admitting: Emergency Medicine

## 2012-05-29 ENCOUNTER — Emergency Department (HOSPITAL_COMMUNITY)
Admission: EM | Admit: 2012-05-29 | Discharge: 2012-05-29 | Disposition: A | Discharge status: Home / Self Care | Payer: Medicare Other | Attending: Emergency Medicine | Admitting: Emergency Medicine

## 2012-05-29 DIAGNOSIS — Z4802 Encounter for removal of sutures: Secondary | ICD-10-CM | POA: Diagnosis not present

## 2012-05-29 NOTE — ED Provider Notes (Signed)
History    This chart was scribed for Andrew Baker, MD, MD by Smitty Pluck. The patient was seen in room TR10C and the patient's care was started at 3:16PM.   CSN: [id number]  Arrival date & time 05/29/12  1122   None     Chief Complaint  Patient presents with  . Suture / Staple Removal    (Consider location/radiation/quality/duration/timing/severity/associated sxs/prior treatment) Patient is a 41 y.o. male presenting with suture removal. The history is provided by the patient.  Suture / Staple Removal    Andrew [name] is a 41 y.o. male who presents to the Emergency Department complaining of staple removal from head placed 10 days ago at [facility]. Denies any other pain.   Past Medical History  Diagnosis Date  . Seizures     History reviewed. No pertinent past surgical history.  History reviewed. No pertinent family history.  History  Substance Use Topics  . Smoking status: Not on file  . Smokeless tobacco: Not on file  . Alcohol Use: No      Review of Systems  Constitutional: Negative for fever and chills.  Respiratory: Negative for shortness of breath.   Gastrointestinal: Negative for nausea and vomiting.  Neurological: Negative for weakness.    Allergies  Review of patient's allergies indicates no known allergies.  Home Medications   Current Outpatient Rx  Name Route Sig Dispense Refill  . LEVETIRACETAM 500 MG PO TABS Oral Take 1,500 mg by mouth every 12 (twelve) hours.    Marland Kitchen PHENYTOIN SODIUM EXTENDED 100 MG PO CAPS Oral Take 200 mg by mouth 2 (two) times daily.    . TOPIRAMATE 100 MG PO TABS Oral Take 100 mg by mouth 2 (two) times daily. Take with Topamax 50mg  to equal 150mg  dose.    . TOPIRAMATE 50 MG PO TABS Oral Take 50 mg by mouth 2 (two) times daily. Take with Topamax 100mg  to equal 150mg  dose.      BP 127/91  Pulse 70  Temp 99.2 F (37.3 C) (Oral)  Resp 18  SpO2 98%  Physical Exam  Nursing note and vitals reviewed. Constitutional: He is  oriented to person, place, and time. He appears well-developed and well-nourished. No distress.  HENT:  Head: Normocephalic and atraumatic.       scalp wound  staples intact  no drainage  Healing wound     Eyes: EOM are normal. Pupils are equal, round, and reactive to light.  Neck: Normal range of motion. Neck supple. No tracheal deviation present.  Cardiovascular: Normal rate.   Pulmonary/Chest: Effort normal. No respiratory distress.  Abdominal: Soft. He exhibits no distension.  Musculoskeletal: Normal range of motion.  Neurological: He is alert and oriented to person, place, and time.  Skin: Skin is warm and dry.  Psychiatric: He has a normal mood and affect. His behavior is normal.    ED Course  Procedures (including critical care time) DIAGNOSTIC STUDIES: Oxygen Saturation is 98% on room air, normal by my interpretation.    COORDINATION OF CARE:    Labs Reviewed - No data to display No results found.   No diagnosis found.    MDM  Staples removed by nursing  I personally performed the services described in this documentation, which was scribed in my presence. The recorded information has been reviewed and considered.         Andrew Baker, MD 05/29/12 1525

## 2012-05-29 NOTE — ED Notes (Signed)
Pt here for suture removal from head placed at [facility]

## 2012-05-29 NOTE — ED Notes (Signed)
Staples removed area clean and dry.

## 2012-07-03 DIAGNOSIS — G40219 Localization-related (focal) (partial) symptomatic epilepsy and epileptic syndromes with complex partial seizures, intractable, without status epilepticus: Secondary | ICD-10-CM | POA: Diagnosis not present

## 2012-07-03 DIAGNOSIS — G40309 Generalized idiopathic epilepsy and epileptic syndromes, not intractable, without status epilepticus: Secondary | ICD-10-CM | POA: Diagnosis not present

## 2012-07-03 DIAGNOSIS — G40119 Localization-related (focal) (partial) symptomatic epilepsy and epileptic syndromes with simple partial seizures, intractable, without status epilepticus: Secondary | ICD-10-CM | POA: Diagnosis not present

## 2012-07-03 DIAGNOSIS — G40209 Localization-related (focal) (partial) symptomatic epilepsy and epileptic syndromes with complex partial seizures, not intractable, without status epilepticus: Secondary | ICD-10-CM | POA: Diagnosis not present

## 2012-07-31 DIAGNOSIS — F0630 Mood disorder due to known physiological condition, unspecified: Secondary | ICD-10-CM | POA: Diagnosis not present

## 2012-12-26 ENCOUNTER — Emergency Department (HOSPITAL_COMMUNITY)
Admission: EM | Admit: 2012-12-26 | Discharge: 2012-12-26 | Disposition: A | Discharge status: Home / Self Care | Payer: Medicare Other | Attending: Emergency Medicine | Admitting: Emergency Medicine

## 2012-12-26 ENCOUNTER — Encounter (HOSPITAL_COMMUNITY): Payer: Self-pay | Admitting: Family Medicine

## 2012-12-26 DIAGNOSIS — G40909 Epilepsy, unspecified, not intractable, without status epilepticus: Secondary | ICD-10-CM | POA: Diagnosis not present

## 2012-12-26 DIAGNOSIS — R7989 Other specified abnormal findings of blood chemistry: Secondary | ICD-10-CM | POA: Insufficient documentation

## 2012-12-26 DIAGNOSIS — S01511A Laceration without foreign body of lip, initial encounter: Secondary | ICD-10-CM

## 2012-12-26 DIAGNOSIS — Y92009 Unspecified place in unspecified non-institutional (private) residence as the place of occurrence of the external cause: Secondary | ICD-10-CM | POA: Insufficient documentation

## 2012-12-26 DIAGNOSIS — R296 Repeated falls: Secondary | ICD-10-CM | POA: Insufficient documentation

## 2012-12-26 DIAGNOSIS — F411 Generalized anxiety disorder: Secondary | ICD-10-CM | POA: Diagnosis not present

## 2012-12-26 DIAGNOSIS — S01501A Unspecified open wound of lip, initial encounter: Secondary | ICD-10-CM | POA: Insufficient documentation

## 2012-12-26 DIAGNOSIS — Z79899 Other long term (current) drug therapy: Secondary | ICD-10-CM | POA: Insufficient documentation

## 2012-12-26 DIAGNOSIS — F329 Major depressive disorder, single episode, unspecified: Secondary | ICD-10-CM | POA: Diagnosis not present

## 2012-12-26 DIAGNOSIS — F3289 Other specified depressive episodes: Secondary | ICD-10-CM | POA: Insufficient documentation

## 2012-12-26 DIAGNOSIS — R569 Unspecified convulsions: Secondary | ICD-10-CM | POA: Diagnosis not present

## 2012-12-26 DIAGNOSIS — Y9301 Activity, walking, marching and hiking: Secondary | ICD-10-CM | POA: Insufficient documentation

## 2012-12-26 HISTORY — DX: Anxiety disorder, unspecified: F41.9

## 2012-12-26 HISTORY — DX: Major depressive disorder, single episode, unspecified: F32.9

## 2012-12-26 HISTORY — DX: Depression, unspecified: F32.A

## 2012-12-26 LAB — GLUCOSE, CAPILLARY: Glucose-Capillary: 185 mg/dL — ABNORMAL HIGH (ref 70–99)

## 2012-12-26 LAB — COMPREHENSIVE METABOLIC PANEL
AST: 13 U/L (ref 0–37)
BUN: 15 mg/dL (ref 6–23)
CO2: 25 mEq/L (ref 19–32)
Chloride: 100 mEq/L (ref 96–112)
Creatinine, Ser: 0.99 mg/dL (ref 0.50–1.35)
GFR calc non Af Amer: >90 mL/min (ref >90)
Total Bilirubin: 0.2 mg/dL — ABNORMAL LOW (ref 0.3–1.2)

## 2012-12-26 LAB — RAPID URINE DRUG SCREEN, HOSP PERFORMED
Cocaine: NOT DETECTED
Opiates: NOT DETECTED

## 2012-12-26 MED ORDER — POTASSIUM CHLORIDE CRYS ER 20 MEQ PO TBCR
40.0000 meq | EXTENDED_RELEASE_TABLET | Freq: Once | ORAL | Status: AC
  Administered 2012-12-26: 40 meq via ORAL
  Filled 2012-12-26: qty 2

## 2012-12-26 NOTE — ED Notes (Signed)
JYN:[id number]<AO> Expected date:12/26/12<BR> Expected time:12:17 AM<BR> Means of arrival:Ambulance<BR> Comments:<BR> seizure

## 2012-12-26 NOTE — ED Notes (Signed)
Pt's mother is his contact, Hilda Lias [phone], unable to come to the hospital now but wants to be updated

## 2012-12-26 NOTE — ED Notes (Signed)
Mom called to check on patient, will call her at discharge

## 2012-12-26 NOTE — ED Notes (Addendum)
Per EMS, patient was walking back to his room when he had a seizure. Mother told EMS that patient was walking, had a seizure and fell face first against the wall. Patient immobilized on LSB. CBG enroute by EMS was 121.

## 2012-12-26 NOTE — ED Provider Notes (Signed)
History     CSN: [id number]  Arrival date & time 12/26/12  0026   First MD Initiated Contact with Patient 12/26/12 0140      Chief Complaint  Patient presents with  . Seizures  . Lip Laceration   HPI  History provided by the patient. Patient is a 42 year old male with history of anxiety, depression and seizure disorder who presents after a seizure. Patient was at home and reportedly had a seizure while walking back to his bedroom. Seizure was witnessed by his mother. Seizure was described as his typical seizures. Patient fell forward with small laceration to his lip. EMS was called and patient was immobilized in a c-collar and transported to the emergency room. Patient states that he has frequent breakthrough seizures that are often caused from temperature change or stress. He denies having any alcohol or drugs. He states he has been taking his Dilantin and Keppra regularly as prescribed. Patient currently denies any significant pain or other injuries. Denies headache, lightheadedness or dizziness. Denies any weakness or numbness in extremities. Denies any back or neck pains. Denies any chest pain or shortness of breath. Denies any other aggravating or alleviating factors. Denies any other associated symptoms.    Past Medical History  Diagnosis Date  . Seizures   . Anxiety   . Depression     No past surgical history on file.  No family history on file.  History  Substance Use Topics  . Smoking status: Not on file  . Smokeless tobacco: Not on file  . Alcohol Use: No      Review of Systems  HENT: Negative for neck pain and neck stiffness.   Respiratory: Negative for cough.   Cardiovascular: Negative for chest pain.  Musculoskeletal: Negative for back pain.  Neurological: Positive for seizures. Negative for weakness, light-headedness, numbness and headaches.  All other systems reviewed and are negative.    Allergies  Review of patient's allergies indicates no known  allergies.  Home Medications   Current Outpatient Rx  Name  Route  Sig  Dispense  Refill  . BUSPIRONE HCL 15 MG PO TABS   Oral   Take 15 mg by mouth 3 (three) times daily.         Marland Kitchen CITALOPRAM HYDROBROMIDE 40 MG PO TABS   Oral   Take 40 mg by mouth daily.         Marland Kitchen LEVETIRACETAM 500 MG PO TABS   Oral   Take 1,500 mg by mouth every 12 (twelve) hours.         Marland Kitchen PHENYTOIN SODIUM EXTENDED 100 MG PO CAPS   Oral   Take 200 mg by mouth 2 (two) times daily. Take 2 capsules (200mg ) in the morning Take 2 capsules plus one 50mg  tablet at bedtime to make a total of 250mg .         . PHENYTOIN 50 MG PO CHEW   Oral   Chew 50 mg by mouth at bedtime. Take at bedtime along with 2 dilantin  100mg  capsules to make a total of 250mg          . TOPIRAMATE 100 MG PO TABS   Oral   Take 100 mg by mouth 2 (two) times daily. Take with Topamax 50mg  to equal 150mg  dose.         . TOPIRAMATE 50 MG PO TABS   Oral   Take 50 mg by mouth 2 (two) times daily. Take with Topamax 100mg  to equal 150mg  dose.  BP 114/67  Pulse 69  Temp 98.1 F (36.7 C) (Oral)  Resp 18  SpO2 98%  Physical Exam  Nursing note and vitals reviewed. Constitutional: He is oriented to person, place, and time. He appears well-developed and well-nourished. No distress.  HENT:  Head: Normocephalic.  Mouth/Throat: Oropharynx is clear and moist.       Small laceration to left lower lip without significant gap. Moderate swelling of the lip. No loose or fractured teeth. No other facial swelling or injury. No battle sign or raccoon eyes.  Eyes: Conjunctivae normal and EOM are normal. Pupils are equal, round, and reactive to light.  Neck: Normal range of motion. Neck supple.       Immobilized in c-collar. No cervical midline tenderness. Normal range of motion without pain or tenderness. Nexus criteria met.  Cardiovascular: Normal rate and regular rhythm.   Pulmonary/Chest: Effort normal and breath sounds normal. No  respiratory distress. He has no wheezes.  Abdominal: Soft. There is no tenderness. There is no rebound and no guarding.  Musculoskeletal: Normal range of motion. He exhibits no edema and no tenderness.  Neurological: He is alert and oriented to person, place, and time. He has normal strength. No cranial nerve deficit or sensory deficit.  Skin: Skin is warm.  Psychiatric: He has a normal mood and affect. His behavior is normal.    ED Course  Procedures   Results for orders placed during the hospital encounter of 12/26/12  GLUCOSE, CAPILLARY      Component Value Range   Glucose-Capillary 185 (*) 70 - 99 mg/dL  PHENYTOIN LEVEL, TOTAL      Component Value Range   Phenytoin Lvl 23.0 (*) 10.0 - 20.0 ug/mL  URINE RAPID DRUG SCREEN (HOSP PERFORMED)      Component Value Range   Opiates NONE DETECTED  NONE DETECTED   Cocaine NONE DETECTED  NONE DETECTED   Benzodiazepines NONE DETECTED  NONE DETECTED   Amphetamines NONE DETECTED  NONE DETECTED   Tetrahydrocannabinol POSITIVE (*) NONE DETECTED   Barbiturates NONE DETECTED  NONE DETECTED  COMPREHENSIVE METABOLIC PANEL      Component Value Range   Sodium 134 (*) 135 - 145 mEq/L   Potassium 3.4 (*) 3.5 - 5.1 mEq/L   Chloride 100  96 - 112 mEq/L   CO2 25  19 - 32 mEq/L   Glucose, Bld 107 (*) 70 - 99 mg/dL   BUN 15  6 - 23 mg/dL   Creatinine, Ser 1.61  0.50 - 1.35 mg/dL   Calcium 8.8  8.4 - 09.6 mg/dL   Total Protein 7.4  6.0 - 8.3 g/dL   Albumin 3.4 (*) 3.5 - 5.2 g/dL   AST 13  0 - 37 U/L   ALT 13  0 - 53 U/L   Alkaline Phosphatase 115  39 - 117 U/L   Total Bilirubin 0.2 (*) 0.3 - 1.2 mg/dL   GFR calc non Af Amer >90  >90 mL/min   GFR calc Af Amer >90  >90 mL/min      1. Seizure   2. Lip laceration       MDM  Patient seen and evaluated. Patient awake and alert oriented x3. No confusion. Patient has small laceration to left lower lip with swelling. There is no significant gaping wound. Laceration does not extend through the  vermilion border. Teeth normal without injury.  Patient reports having history of frequent breakthrough seizures. He states he is a related to temperature and stress. He  denies any alcohol or drug use. He has been taking his Dilantin and Keppra as prescribed. He denies any pain or other injuries. Denies any bite marks to the tongue. Denies any neck or back pains.  Patient continues to do well. Dilantin level slightly elevated. Will encourage patient to skip one dose. He has been instructed to continue followup with PCP. No other concerning findings today.      Angus Seller, [place] 12/26/12 [phone]

## 2012-12-26 NOTE — ED Notes (Signed)
Pt states that he will have marijuana in his urine and that's why he fell

## 2012-12-26 NOTE — ED Notes (Signed)
Pt has a hx of seizures and wears a helmet, he fell this evening face first, he has a busted lip on the left side, pt aware of the seizure and states that he has them everyday. Pt states that he does take his medications.

## 2012-12-27 NOTE — ED Provider Notes (Signed)
Medical screening examination/treatment/procedure(s) were performed by non-physician practitioner and as supervising physician I was immediately available for consultation/collaboration.  John-Adam Melisia Leming, M.D.     John-Adam Halden Phegley, MD 12/27/12 0012 

## 2013-01-07 ENCOUNTER — Emergency Department (HOSPITAL_COMMUNITY)
Admission: EM | Admit: 2013-01-07 | Discharge: 2013-01-07 | Discharge status: Against Medical Advice | Payer: Medicare Other

## 2013-03-01 DIAGNOSIS — R3129 Other microscopic hematuria: Secondary | ICD-10-CM | POA: Diagnosis not present

## 2013-03-12 DIAGNOSIS — M79609 Pain in unspecified limb: Secondary | ICD-10-CM | POA: Diagnosis not present

## 2013-03-12 DIAGNOSIS — B351 Tinea unguium: Secondary | ICD-10-CM | POA: Diagnosis not present

## 2013-03-27 ENCOUNTER — Emergency Department (HOSPITAL_COMMUNITY)
Admission: EM | Admit: 2013-03-27 | Discharge: 2013-03-27 | Disposition: A | Discharge status: Home / Self Care | Payer: Medicare Other | Attending: Emergency Medicine | Admitting: Emergency Medicine

## 2013-03-27 DIAGNOSIS — Z79899 Other long term (current) drug therapy: Secondary | ICD-10-CM | POA: Insufficient documentation

## 2013-03-27 DIAGNOSIS — F329 Major depressive disorder, single episode, unspecified: Secondary | ICD-10-CM | POA: Diagnosis not present

## 2013-03-27 DIAGNOSIS — H5789 Other specified disorders of eye and adnexa: Secondary | ICD-10-CM | POA: Diagnosis not present

## 2013-03-27 DIAGNOSIS — R55 Syncope and collapse: Secondary | ICD-10-CM | POA: Insufficient documentation

## 2013-03-27 DIAGNOSIS — G40909 Epilepsy, unspecified, not intractable, without status epilepticus: Secondary | ICD-10-CM | POA: Diagnosis not present

## 2013-03-27 DIAGNOSIS — F411 Generalized anxiety disorder: Secondary | ICD-10-CM | POA: Insufficient documentation

## 2013-03-27 DIAGNOSIS — R569 Unspecified convulsions: Secondary | ICD-10-CM | POA: Diagnosis not present

## 2013-03-27 DIAGNOSIS — F3289 Other specified depressive episodes: Secondary | ICD-10-CM | POA: Insufficient documentation

## 2013-03-27 LAB — COMPREHENSIVE METABOLIC PANEL
ALT: 16 U/L (ref 0–53)
AST: 20 U/L (ref 0–37)
Albumin: 3.5 g/dL (ref 3.5–5.2)
Alkaline Phosphatase: 120 U/L — ABNORMAL HIGH (ref 39–117)
Glucose, Bld: 85 mg/dL (ref 70–99)
Potassium: 3.7 mEq/L (ref 3.5–5.1)
Sodium: 139 mEq/L (ref 135–145)
Total Protein: 7.3 g/dL (ref 6.0–8.3)

## 2013-03-27 LAB — RAPID URINE DRUG SCREEN, HOSP PERFORMED
Amphetamines: NOT DETECTED
Cocaine: NOT DETECTED
Opiates: NOT DETECTED
Tetrahydrocannabinol: POSITIVE — AB

## 2013-03-27 LAB — URINALYSIS, ROUTINE W REFLEX MICROSCOPIC
Bilirubin Urine: NEGATIVE
Glucose, UA: NEGATIVE mg/dL
Specific Gravity, Urine: 1.014 (ref 1.005–1.030)
pH: 6.5 (ref 5.0–8.0)

## 2013-03-27 LAB — CBC WITH DIFFERENTIAL/PLATELET
Basophils Absolute: 0 10*3/uL (ref 0.0–0.1)
Basophils Relative: 0 % (ref 0–1)
Eosinophils Absolute: 0.1 10*3/uL (ref 0.0–0.7)
Lymphs Abs: 2.5 10*3/uL (ref 0.7–4.0)
MCH: 29.8 pg (ref 26.0–34.0)
Neutrophils Relative %: 75 % (ref 43–77)
Platelets: 219 10*3/uL (ref 150–400)
RBC: 4.66 MIL/uL (ref 4.22–5.81)
WBC: 13.1 10*3/uL — ABNORMAL HIGH (ref 4.0–10.5)

## 2013-03-27 LAB — ETHANOL: Alcohol, Ethyl (B): <11 mg/dL (ref 0–11)

## 2013-03-27 LAB — URINE MICROSCOPIC-ADD ON

## 2013-03-27 NOTE — ED Notes (Signed)
Per Effie Shy, MD pt stable to be d/c, pt ambulatory, pt wearing helmet, speaks in complete sentences, follows commands

## 2013-03-27 NOTE — ED Notes (Signed)
Vitals as taken in ambulance by [facility].

## 2013-03-27 NOTE — ED Notes (Signed)
Per EMS the patient had a seizure this morning, is currently post-ictal. Patient was able to report his last name, all other attempts to speak were "yes." Right sided gaze. Left eye very red and bloodshot, medial. 18g right AC. Last seen normal was last night. Seen in this condition at 0700. Hasn't eaten or had anything to drink today. BS 112.

## 2013-03-27 NOTE — ED Notes (Signed)
Patient's mother at the bedside. States that the seizure lasted a few seconds and that the patient has seizures every week, sometimes a few times per day. Joycelyn Das, MD, made aware and evaluated the patient. MD decided patient would still be discharged.

## 2013-03-27 NOTE — ED Provider Notes (Signed)
History     CSN: [id number]  Arrival date & time 03/27/13  1304   First MD Initiated Contact with Patient 03/27/13 1409      Chief Complaint  Patient presents with  . Seizures    (Consider location/radiation/quality/duration/timing/severity/associated sxs/prior treatment) HPI Pt with seizure d/o on 3 different antiepileptics followed by [facility]. Mother states pt was unresponsive this morning at 0710. She states she heard him fall but did not witness convulsive activity. Pt with improved responsiveness. Pt then had another episode which was not witnessed and was unresponsive afterwards. Mother called EMS. Pt remained unresponsive. Pt is now awake and near baseline. He c/o no pain in head, face neck. Move all ext. Admits to marijuana use. +compliant with meds. No recent illness.   Past Medical History  Diagnosis Date  . Seizures   . Anxiety   . Depression     No past surgical history on file.  No family history on file.  History  Substance Use Topics  . Smoking status: Not on file  . Smokeless tobacco: Not on file  . Alcohol Use: No      Review of Systems  Constitutional: Negative for fever and chills.  HENT: Negative for neck pain.   Eyes: Positive for redness. Negative for pain and visual disturbance.  Respiratory: Negative for shortness of breath.   Cardiovascular: Negative for chest pain.  Gastrointestinal: Negative for nausea, vomiting and abdominal pain.  Musculoskeletal: Negative for back pain.  Skin: Negative for rash and wound.  Neurological: Positive for seizures and syncope. Negative for dizziness, weakness, numbness and headaches.  All other systems reviewed and are negative.    Allergies  Review of patient's allergies indicates no known allergies.  Home Medications   Current Outpatient Rx  Name  Route  Sig  Dispense  Refill  . busPIRone (BUSPAR) 15 MG tablet   Oral   Take 15 mg by mouth 2 (two) times daily.          . citalopram  (CELEXA) 40 MG tablet   Oral   Take 40 mg by mouth daily.         Marland Kitchen levETIRAcetam (KEPPRA) 500 MG tablet   Oral   Take 1,500 mg by mouth every 12 (twelve) hours.         . phenytoin (DILANTIN) 100 MG ER capsule   Oral   Take 200 mg by mouth 2 (two) times daily.          Marland Kitchen topiramate (TOPAMAX) 100 MG tablet   Oral   Take 100 mg by mouth 2 (two) times daily. Take with Topamax 50mg  to equal 150mg  dose.         . topiramate (TOPAMAX) 50 MG tablet   Oral   Take 50 mg by mouth 2 (two) times daily. Take with Topamax 100mg  to equal 150mg  dose.           BP 138/91  Pulse 69  Temp(Src) 98.6 F (37 C) (Oral)  Resp 20  SpO2 99%  Physical Exam  Nursing note and vitals reviewed. Constitutional: He is oriented to person, place, and time. He appears well-developed and well-nourished. No distress.  HENT:  Head: Normocephalic and atraumatic.  Mouth/Throat: Oropharynx is clear and moist.  No facial bone tenderness or swelling  Eyes: EOM are normal. Pupils are equal, round, and reactive to light.  Left eye subconjunctival hemorrhage  Neck: Normal range of motion. Neck supple.  Cardiovascular: Normal rate and regular rhythm.  Pulmonary/Chest: Effort normal and breath sounds normal. No respiratory distress. He has no wheezes. He has no rales. He exhibits no tenderness.  Abdominal: Soft. Bowel sounds are normal. He exhibits no distension and no mass. There is no tenderness. There is no rebound and no guarding.  Musculoskeletal: Normal range of motion. He exhibits no edema and no tenderness.  Neurological: He is alert and oriented to person, place, and time.  5/5 motor in all ext, sensation intact  Skin: Skin is warm and dry. No rash noted. No erythema.  Psychiatric: He has a normal mood and affect. His behavior is normal.    ED Course  Procedures (including critical care time)  Labs Reviewed  CBC WITH DIFFERENTIAL  COMPREHENSIVE METABOLIC PANEL  PHENYTOIN LEVEL, TOTAL   URINALYSIS, ROUTINE W REFLEX MICROSCOPIC   No results found.   No diagnosis found.    MDM  Pt signed out to oncoming MD.         Loren Racer, MD 03/29/13 [phone]

## 2013-03-27 NOTE — ED Notes (Signed)
Pt. Alert and oriented x4. Denies pain. Neuro intact. No injury or trauma to tongue noted. RN noted red area in left eye- mother sts new finding.

## 2013-03-27 NOTE — ED Provider Notes (Signed)
Andrew [name] is a 42 y.o. male who is here for evaluation of multiple seizures today. He states that he has seizures fairly frequently. He is taking his usual medications. He was reevaluated at 17:25. There been no more seizures in the ED. He is calm, comfortable, and wishes to go home. He will call his mother to come and get him. The Dilantin level is normal. The repeat vital signs, serially are normal.  Patient Vitals for the past 24 hrs:  BP Temp Temp src Pulse Resp SpO2  03/27/13 1700 137/90 mmHg - - 66 20 100 %  03/27/13 1630 131/86 mmHg - - 65 19 99 %  03/27/13 1615 135/91 mmHg - - 68 20 100 %  03/27/13 1600 141/87 mmHg - - 70 20 100 %  03/27/13 1547 131/83 mmHg 98.7 F (37.1 C) Oral 66 14 99 %  03/27/13 1530 149/85 mmHg - - 71 24 100 %  03/27/13 1515 126/86 mmHg - - 71 25 97 %  03/27/13 1445 135/85 mmHg - - 66 20 99 %  03/27/13 1415 143/84 mmHg - - 68 21 100 %  03/27/13 1316 138/91 mmHg 98.6 F (37 C) Oral 69 20 99 %  03/27/13 1315 132/84 mmHg - - 66 22 100 %  03/27/13 1257 140/86 mmHg - - 70 16 -   Results for orders placed during the hospital encounter of 03/27/13  CBC WITH DIFFERENTIAL      Result Value Range   WBC 13.1 (*) 4.0 - 10.5 K/uL   RBC 4.66  4.22 - 5.81 MIL/uL   Hemoglobin 13.9  13.0 - 17.0 g/dL   HCT 16.1 (*) 09.6 - 04.5 %   MCV 81.5  78.0 - 100.0 fL   MCH 29.8  26.0 - 34.0 pg   MCHC 36.6 (*) 30.0 - 36.0 g/dL   RDW 40.9  81.1 - 91.4 %   Platelets 219  150 - 400 K/uL   Neutrophils Relative 75  43 - 77 %   Neutro Abs 9.7 (*) 1.7 - 7.7 K/uL   Lymphocytes Relative 19  12 - 46 %   Lymphs Abs 2.5  0.7 - 4.0 K/uL   Monocytes Relative 6  3 - 12 %   Monocytes Absolute 0.7  0.1 - 1.0 K/uL   Eosinophils Relative 1  0 - 5 %   Eosinophils Absolute 0.1  0.0 - 0.7 K/uL   Basophils Relative 0  0 - 1 %   Basophils Absolute 0.0  0.0 - 0.1 K/uL  COMPREHENSIVE METABOLIC PANEL      Result Value Range   Sodium 139  135 - 145 mEq/L   Potassium 3.7  3.5 - 5.1 mEq/L   Chloride 109  96 - 112 mEq/L   CO2 21  19 - 32 mEq/L   Glucose, Bld 85  70 - 99 mg/dL   BUN 8  6 - 23 mg/dL   Creatinine, Ser 7.82  0.50 - 1.35 mg/dL   Calcium 9.2  8.4 - 95.6 mg/dL   Total Protein 7.3  6.0 - 8.3 g/dL   Albumin 3.5  3.5 - 5.2 g/dL   AST 20  0 - 37 U/L   ALT 16  0 - 53 U/L   Alkaline Phosphatase 120 (*) 39 - 117 U/L   Total Bilirubin 0.2 (*) 0.3 - 1.2 mg/dL   GFR calc non Af Amer >90  >90 mL/min   GFR calc Af Amer >90  >90 mL/min  PHENYTOIN LEVEL,  TOTAL      Result Value Range   Phenytoin Lvl 11.4  10.0 - 20.0 ug/mL  URINALYSIS, ROUTINE W REFLEX MICROSCOPIC      Result Value Range   Color, Urine YELLOW  YELLOW   APPearance CLEAR  CLEAR   Specific Gravity, Urine 1.014  1.005 - 1.030   pH 6.5  5.0 - 8.0   Glucose, UA NEGATIVE  NEGATIVE mg/dL   Hgb urine dipstick MODERATE (*) NEGATIVE   Bilirubin Urine NEGATIVE  NEGATIVE   Ketones, ur NEGATIVE  NEGATIVE mg/dL   Protein, ur NEGATIVE  NEGATIVE mg/dL   Urobilinogen, UA 0.2  0.0 - 1.0 mg/dL   Nitrite NEGATIVE  NEGATIVE   Leukocytes, UA NEGATIVE  NEGATIVE  ETHANOL      Result Value Range   Alcohol, Ethyl (B) <11  0 - 11 mg/dL  URINE RAPID DRUG SCREEN (HOSP PERFORMED)      Result Value Range   Opiates NONE DETECTED  NONE DETECTED   Cocaine NONE DETECTED  NONE DETECTED   Benzodiazepines NONE DETECTED  NONE DETECTED   Amphetamines NONE DETECTED  NONE DETECTED   Tetrahydrocannabinol POSITIVE (*) NONE DETECTED   Barbiturates NONE DETECTED  NONE DETECTED  SALICYLATE LEVEL      Result Value Range   Salicylate Lvl <2.0 (*) 2.8 - 20.0 mg/dL  ACETAMINOPHEN LEVEL      Result Value Range   Acetaminophen (Tylenol), Serum <15.0  10 - 30 ug/mL  URINE MICROSCOPIC-ADD ON      Result Value Range   Squamous Epithelial / LPF RARE  RARE   WBC, UA 0-2  <3 WBC/hpf   RBC / HPF 7-10  <3 RBC/hpf    Assessment: Seizure in patient with epilepsy. He appears to be in appropriate doses as her medications. We are only able to level his  Dilantin. Doubt metabolic instability, serious bacterial infection or impending vascular collapse; the patient is stable for discharge.  Plan: Discharge continue usual medications. He is advised to follow with his neurologist if he has further episodes of seizures  Flint Melter, MD 03/27/13 1730

## 2013-03-28 ENCOUNTER — Telehealth: Payer: Self-pay | Admitting: *Deleted

## 2013-03-28 NOTE — Telephone Encounter (Signed)
Please let mom know I reviewed the ER visit. Everything looked good. It is not unusual to not "feel yourself" for several days after a seizure

## 2013-03-28 NOTE — Telephone Encounter (Signed)
Patient had several seizures yesterday and went to the ER. Mother states that everything was normal but was told to call the office today. The mother wants to know to the patient needs to see another kind of doctor or what should she do because the patient is not hisself? Please advise.

## 2013-03-28 NOTE — Telephone Encounter (Signed)
Spoke to patient mother and she is aware of what's going on now and patient is getting better.

## 2013-05-30 ENCOUNTER — Other Ambulatory Visit: Payer: Self-pay | Admitting: Neurology

## 2013-06-06 ENCOUNTER — Emergency Department (HOSPITAL_COMMUNITY)
Admission: EM | Admit: 2013-06-06 | Discharge: 2013-06-06 | Disposition: A | Discharge status: Home / Self Care | Payer: Medicare Other | Attending: Emergency Medicine | Admitting: Emergency Medicine

## 2013-06-06 ENCOUNTER — Emergency Department (HOSPITAL_COMMUNITY): Payer: Medicare Other

## 2013-06-06 DIAGNOSIS — S0100XA Unspecified open wound of scalp, initial encounter: Secondary | ICD-10-CM | POA: Diagnosis not present

## 2013-06-06 DIAGNOSIS — S0993XA Unspecified injury of face, initial encounter: Secondary | ICD-10-CM | POA: Diagnosis not present

## 2013-06-06 DIAGNOSIS — R51 Headache: Secondary | ICD-10-CM | POA: Diagnosis not present

## 2013-06-06 DIAGNOSIS — F3289 Other specified depressive episodes: Secondary | ICD-10-CM | POA: Insufficient documentation

## 2013-06-06 DIAGNOSIS — Z79899 Other long term (current) drug therapy: Secondary | ICD-10-CM | POA: Insufficient documentation

## 2013-06-06 DIAGNOSIS — S0190XA Unspecified open wound of unspecified part of head, initial encounter: Secondary | ICD-10-CM | POA: Diagnosis not present

## 2013-06-06 DIAGNOSIS — M542 Cervicalgia: Secondary | ICD-10-CM | POA: Diagnosis not present

## 2013-06-06 DIAGNOSIS — F329 Major depressive disorder, single episode, unspecified: Secondary | ICD-10-CM | POA: Insufficient documentation

## 2013-06-06 DIAGNOSIS — Y9289 Other specified places as the place of occurrence of the external cause: Secondary | ICD-10-CM | POA: Insufficient documentation

## 2013-06-06 DIAGNOSIS — F411 Generalized anxiety disorder: Secondary | ICD-10-CM | POA: Insufficient documentation

## 2013-06-06 DIAGNOSIS — Y9301 Activity, walking, marching and hiking: Secondary | ICD-10-CM | POA: Insufficient documentation

## 2013-06-06 DIAGNOSIS — T148XXA Other injury of unspecified body region, initial encounter: Secondary | ICD-10-CM | POA: Diagnosis not present

## 2013-06-06 DIAGNOSIS — G40909 Epilepsy, unspecified, not intractable, without status epilepticus: Secondary | ICD-10-CM | POA: Insufficient documentation

## 2013-06-06 DIAGNOSIS — S199XXA Unspecified injury of neck, initial encounter: Secondary | ICD-10-CM | POA: Diagnosis not present

## 2013-06-06 DIAGNOSIS — R569 Unspecified convulsions: Secondary | ICD-10-CM | POA: Diagnosis not present

## 2013-06-06 DIAGNOSIS — R296 Repeated falls: Secondary | ICD-10-CM | POA: Insufficient documentation

## 2013-06-06 LAB — PHENYTOIN LEVEL, TOTAL: Phenytoin Lvl: 10.9 ug/mL (ref 10.0–20.0)

## 2013-06-06 LAB — BASIC METABOLIC PANEL
BUN: 10 mg/dL (ref 6–23)
Calcium: 9.4 mg/dL (ref 8.4–10.5)
Creatinine, Ser: 0.93 mg/dL (ref 0.50–1.35)
GFR calc Af Amer: >90 mL/min (ref >90)
GFR calc non Af Amer: >90 mL/min (ref >90)

## 2013-06-06 LAB — CBC WITH DIFFERENTIAL/PLATELET
Basophils Absolute: 0 10*3/uL (ref 0.0–0.1)
Basophils Relative: 0 % (ref 0–1)
HCT: 40.9 % (ref 39.0–52.0)
MCHC: 35.2 g/dL (ref 30.0–36.0)
Monocytes Absolute: 0.7 10*3/uL (ref 0.1–1.0)
Neutro Abs: 4.9 10*3/uL (ref 1.7–7.7)
Neutrophils Relative %: 62 % (ref 43–77)
Platelets: 213 10*3/uL (ref 150–400)
RDW: 15.2 % (ref 11.5–15.5)
WBC: 7.8 10*3/uL (ref 4.0–10.5)

## 2013-06-06 MED ORDER — SODIUM CHLORIDE 0.9 % IV SOLN
500.0000 mg | Freq: Two times a day (BID) | INTRAVENOUS | Status: DC
  Administered 2013-06-06: 500 mg via INTRAVENOUS
  Filled 2013-06-06 (×2): qty 5

## 2013-06-06 NOTE — ED Notes (Signed)
Talkative, pleasant, alert x 3,

## 2013-06-06 NOTE — ED Notes (Signed)
To ED via [facility] medic [facility]-- from [facility] and [facility]. Witnessed seizure -- fell, hit head on ground and bleeding controlled approx 3/4" occipital area-- multiple abrasions to arms. Post ictal on scene-- alert on arrival

## 2013-06-06 NOTE — ED Provider Notes (Signed)
History    CSN: [id number] Arrival date & time 06/06/13  1158  First MD Initiated Contact with Patient 06/06/13 1200     Chief Complaint  Patient presents with  . Seizures   (Consider location/radiation/quality/duration/timing/severity/associated sxs/prior Treatment) HPI Comments: Patient is a 42 yo M PMHx significant for epilepsy, anxiety, and depression presents to the ED via EMS after a witnessed seizure PTA. According to EMS, the patient was walking when he had a seizure, fell hit his head on the sidewalk receiving a 3/4" laceration to occipital area w/ multiple abrasions. Patient was post-ictal upon EMS arrival, but is back to baseline in ED. Pt is complaining of sharp posterior headache w/ sharp posterior midline neck pain. He has no other physical complaints at this time. He states he has been taking his Dilantin and Keppra as prescribed by [facility].   Patient is a 42 y.o. male presenting with seizures. The history is provided by the patient and the EMS personnel.  Seizures Seizure activity on arrival: no    Past Medical History  Diagnosis Date  . Seizures   . Anxiety   . Depression    No past surgical history on file. No family history on file. History  Substance Use Topics  . Smoking status: Not on file  . Smokeless tobacco: Not on file  . Alcohol Use: No    Review of Systems  Constitutional: Negative for fever and chills.  HENT: Positive for neck pain and neck stiffness.   Eyes: Negative.   Respiratory: Negative for shortness of breath.   Cardiovascular: Negative for chest pain.  Gastrointestinal: Negative for nausea, vomiting and abdominal pain.  Genitourinary: Negative.   Musculoskeletal: Negative for back pain.  Skin: Positive for wound.  Neurological: Positive for seizures and headaches.    Allergies  Review of patient's allergies indicates no known allergies.  Home Medications   Current Outpatient Rx  Name  Route  Sig  Dispense  Refill   . busPIRone (BUSPAR) 15 MG tablet   Oral   Take 15 mg by mouth 2 (two) times daily.          . citalopram (CELEXA) 40 MG tablet   Oral   Take 40 mg by mouth daily.         Marland Kitchen levETIRAcetam (KEPPRA) 500 MG tablet   Oral   Take 1,500 mg by mouth 2 (two) times daily.         . phenytoin (DILANTIN) 100 MG ER capsule   Oral   Take 200 mg by mouth 2 (two) times daily.          Marland Kitchen topiramate (TOPAMAX) 100 MG tablet   Oral   Take 100 mg by mouth 2 (two) times daily. Take with Topamax 50mg  to equal 150mg  dose.         . topiramate (TOPAMAX) 50 MG tablet   Oral   Take 50 mg by mouth 2 (two) times daily. Take with Topamax 100mg  to equal 150mg  dose.          BP 152/97  Pulse 65  Temp(Src) 98.5 F (36.9 C) (Oral)  Resp 11  SpO2 100% Physical Exam  Constitutional: He is oriented to person, place, and time. He appears well-developed and well-nourished. No distress.  HENT:  Head: Normocephalic and atraumatic.  Mouth/Throat: Oropharynx is clear and moist.  Eyes: Conjunctivae and EOM are normal. Pupils are equal, round, and reactive to light.  Neck: Neck supple. Spinous process tenderness present.  Pulmonary/Chest:  Effort normal and breath sounds normal.  Abdominal: Soft. There is no tenderness.  Neurological: He is alert and oriented to person, place, and time. He has normal strength. No cranial nerve deficit or sensory deficit.  Skin: Skin is warm and dry.    ED Course  Procedures (including critical care time)  Medications  levETIRAcetam (KEPPRA) 500 mg in sodium chloride 0.9 % 100 mL IVPB (0 mg Intravenous Stopped 06/06/13 1400)     Labs Reviewed  PHENYTOIN LEVEL, TOTAL  BASIC METABOLIC PANEL  CBC WITH DIFFERENTIAL    Date: 06/06/2013  Rate: 65  Rhythm: normal sinus rhythm  QRS Axis: normal  Intervals: normal  ST/T Wave abnormalities: normal  Conduction Disutrbances:none  Narrative Interpretation:   Old EKG Reviewed: unchanged  LACERATION  REPAIR Performed by: Jeannetta Ellis Authorized by: Jeannetta Ellis Consent: Verbal consent obtained. Risks and benefits: risks, benefits and alternatives were discussed Consent given by: patient Patient identity confirmed: provided demographic data Prepped and Draped in normal sterile fashion Wound explored  Laceration Location: posterior scalp  Laceration Length: 0.5 cm  No Foreign Bodies seen or palpated  Anesthesia: local infiltration  Local anesthetic: none  Anesthetic total: 0 ml  Irrigation method: syringe Amount of cleaning: standard  Skin closure: staples  Number of sutures: 1  Technique: staples  Patient tolerance: Patient tolerated the procedure well with no immediate complications.   Ct Head Wo Contrast  06/06/2013   *RADIOLOGY REPORT*  Clinical Data:  Seizure with fall.  Headache with posterior head laceration.  CT HEAD WITHOUT CONTRAST CT CERVICAL SPINE WITHOUT CONTRAST  Technique:  Multidetector CT imaging of the head and cervical spine was performed following the standard protocol without intravenous contrast.  Multiplanar CT image reconstructions of the cervical spine were also generated.  Comparison:  Head CT 05/15/2012.  Cervical spine CT 08/28/2011.  CT HEAD  Findings: There is soft tissue swelling in the right occipital scalp.  No underlying calvarial fracture is demonstrated.  The mastoid air cells, middle ears and visualized paranasal sinuses are clear aside from a stable peripherally calcified mucous retention cyst in the right maxillary sinus.  There is no evidence of acute intracranial hemorrhage, mass lesion, brain edema or extra-axial fluid collection.  There is no hydrocephalus.  Mild asymmetry of the dural sinuses is stable.  IMPRESSION: No acute intracranial or calvarial findings.  Right occipital scalp soft tissue swelling.  CT CERVICAL SPINE  Findings: There is stable chronic fragmentation of the tip of the C2 spinous process.  The  alignment is stable with mild reversal of the usual cervical lordosis.  There is no focal angulation or listhesis.  There is no evidence of acute fracture.  Disc space loss and posterior osteophytes from C4-C5 through C6-C7 are similar to the prior study.  No acute soft tissue findings are demonstrated.  IMPRESSION: No evidence of acute cervical spine fracture, traumatic subluxation or static signs instability.  Stable spondylosis.   Original Report Authenticated By: Carey Bullocks, M.D.   Ct Cervical Spine Wo Contrast  06/06/2013   *RADIOLOGY REPORT*  Clinical Data:  Seizure with fall.  Headache with posterior head laceration.  CT HEAD WITHOUT CONTRAST CT CERVICAL SPINE WITHOUT CONTRAST  Technique:  Multidetector CT imaging of the head and cervical spine was performed following the standard protocol without intravenous contrast.  Multiplanar CT image reconstructions of the cervical spine were also generated.  Comparison:  Head CT 05/15/2012.  Cervical spine CT 08/28/2011.  CT HEAD  Findings: There is  soft tissue swelling in the right occipital scalp.  No underlying calvarial fracture is demonstrated.  The mastoid air cells, middle ears and visualized paranasal sinuses are clear aside from a stable peripherally calcified mucous retention cyst in the right maxillary sinus.  There is no evidence of acute intracranial hemorrhage, mass lesion, brain edema or extra-axial fluid collection.  There is no hydrocephalus.  Mild asymmetry of the dural sinuses is stable.  IMPRESSION: No acute intracranial or calvarial findings.  Right occipital scalp soft tissue swelling.  CT CERVICAL SPINE  Findings: There is stable chronic fragmentation of the tip of the C2 spinous process.  The alignment is stable with mild reversal of the usual cervical lordosis.  There is no focal angulation or listhesis.  There is no evidence of acute fracture.  Disc space loss and posterior osteophytes from C4-C5 through C6-C7 are similar to the prior  study.  No acute soft tissue findings are demonstrated.  IMPRESSION: No evidence of acute cervical spine fracture, traumatic subluxation or static signs instability.  Stable spondylosis.   Original Report Authenticated By: Carey Bullocks, M.D.   1. Epilepsy     MDM  Patient with known history of embolus he presented to the emergency department after a witnessed seizure earlier today. Patient endorses taking his Keppra and Dilantin as scheduled. Patient complained of posterior midline C-spine tenderness and headache. CT scan is reviewed. C-spine cleared collar removed. EKG and labs reviewed. Keppra bolus given. Patient ED for just about 3 hours without further seizure activity. She will be discharged with followup with his neurologist at [facility]. Patient agreeable to plan. Patient d/w with Dr. Ranae Palms, agrees with plan. Patient is stable at time of discharge     Jeannetta Ellis, PA-C 06/06/13 1600

## 2013-06-07 NOTE — ED Provider Notes (Signed)
Medical screening examination/treatment/procedure(s) were performed by non-physician practitioner and as supervising physician I was immediately available for consultation/collaboration.   Ranulfo Kall, MD 06/07/13 0711 

## 2013-07-16 ENCOUNTER — Ambulatory Visit: Payer: Self-pay | Admitting: Neurology

## 2013-07-17 ENCOUNTER — Ambulatory Visit: Payer: Self-pay | Admitting: Neurology

## 2013-07-26 ENCOUNTER — Encounter: Payer: Self-pay | Admitting: Neurology

## 2013-07-26 ENCOUNTER — Ambulatory Visit (INDEPENDENT_AMBULATORY_CARE_PROVIDER_SITE_OTHER): Payer: Medicare Other | Admitting: Neurology

## 2013-07-26 VITALS — BP 116/77 | HR 72 | Ht 69.5 in | Wt 200.0 lb

## 2013-07-26 DIAGNOSIS — R569 Unspecified convulsions: Secondary | ICD-10-CM | POA: Insufficient documentation

## 2013-07-26 DIAGNOSIS — F411 Generalized anxiety disorder: Secondary | ICD-10-CM | POA: Diagnosis not present

## 2013-07-26 DIAGNOSIS — F419 Anxiety disorder, unspecified: Secondary | ICD-10-CM

## 2013-07-26 MED ORDER — LACOSAMIDE 50 MG PO TABS: 100.0000 mg | ORAL_TABLET | Freq: Two times a day (BID) | ORAL | Status: DC

## 2013-07-26 NOTE — Progress Notes (Signed)
History of Present Illness:   Andrew [name], 42 year-old black male returns today for followup for seizure. Last seen by Andrew [name] in Feb 2014.      He has a history of intractable seizure disorder since 42 years old. He has been followed in the office since 1990. 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 He has had EMG monitoring at [facility] in the past that included abnormal baseline EEG due to intermittent predominantly right temporal spike, sharp right,and focal slowing.  He continues to refuse surgery to remove irritable focus.  Over the years, he has tried different combination, he has been on current medications  Topamax 100 mg twice a day, +50 mg twice a day, Dilantin 100 mg 2 tablets twice a day, Keppra 500 mg 3 tablets b.i.d, brand name name medications, he is also taking Buspirone 10 mg t.i.d., citalopram 20 mg every day for his depression and angry control,  He continues to have 2-3 seizures every day, complex partial with secondary generalization, he had burst open few helmet, during his seizure, he continued to smoke marijuana and cigarette.  He helps his mother taking care of his elderly bed ridden grandmother at home, has raging spells sometimes.  UPDATE Sep 4th 2014:  He denies significant side effects from medications, most recent Dilantin level was 23.7 in February 2014, Dilantin dosage was decreased from 250 twice a day to current 200 mg twice a day, he had 2 short lasting complex partial seizure at office today.    Review of Systems  Out of a complete 14 system review, the patient complains of only the following symptoms, and all other reviewed systems are negative.  Constitutional: Weight loss   Eyes: Blurred vision   Respiratory: Snoring   Neurological: Confusion   Seizure, snoring  Genitourinary: Urination problems    Depression, change in appetite  Social History Patient is disabled, high school education, resides in the home with his mom. No children. Never  married. Denies caffeine or alcohol, cigarettes a day, occasional marijuana use. Patient is left-handed. See Dr. Betti Cruz for psych issues  Inhaled Tobacco Use: Current every day smoker  Family History  Father died of an overdose at age 75. Mother living in good health  Past Medical History Intractable seizure disorder. behavior issues, seen by Dr. Betti Cruz  Surgical History  No surgery.  Physical Exam  General: well developed, well  nourished, seated, in no evident distress Head: wearing a helmet Neck: supple no carotid bruits Respiratory: clear to auscultation bilaterally Cardiovascular: regular rate rhythm  Neurologic Exam  Mental Status: pleasant, awake, alert, cooperative to history, talking, and casual conversation. profound gingiva hypertrophy, wear helmet Cranial Nerves: CN II-XII pupils were equal round reactive to light.    Extraocular movements were full.  Visual fields were full on confrontational test.  Facial sensation and strength were normal.  Hearing was intact to finger rubbing bilaterally.  Uvula tongue were midline.  Head turning and shoulder shrugging were normal and symmetric.  Tongue protrusion into the cheeks strength were normal.  Motor: Normal tone, bulk, and strength. Sensory: Normal to light touch Coordination: Normal finger-to-nose, heel-to-shin.  There was no dysmetria noticed. 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 Pt has refused surgery.    1. Continue Dilantin 200 mg BID. Check trough level in am 2,  Cont Topamax to 100mg  +50mg  bid. 3. Continue Keppra 1500mg  BID.  4. Add on Vimpat, titrating to 50mg  2 tab bid. 5. RTC with Andrew [name] in 6 months

## 2013-07-27 ENCOUNTER — Other Ambulatory Visit (INDEPENDENT_AMBULATORY_CARE_PROVIDER_SITE_OTHER): Payer: Self-pay

## 2013-07-27 ENCOUNTER — Ambulatory Visit: Payer: Medicare Other

## 2013-07-27 DIAGNOSIS — F411 Generalized anxiety disorder: Secondary | ICD-10-CM | POA: Diagnosis not present

## 2013-07-27 DIAGNOSIS — R569 Unspecified convulsions: Secondary | ICD-10-CM | POA: Diagnosis not present

## 2013-07-27 DIAGNOSIS — Z0289 Encounter for other administrative examinations: Secondary | ICD-10-CM

## 2013-07-27 NOTE — Addendum Note (Signed)
Addended by: Elmon Kirschner on: 07/27/2013 08:16 AM   Modules accepted: Orders

## 2013-07-28 LAB — PHENYTOIN LEVEL, TOTAL: Phenytoin Lvl: 9.2 ug/mL — ABNORMAL LOW (ref 10.0–20.0)

## 2013-07-28 NOTE — Progress Notes (Signed)
Quick Note:  Please call patient, dilantin level is 9.2, mildly low, keep treatment plan as discussed in appointment, no change in dilantin dosage. ______

## 2013-07-30 DIAGNOSIS — F0630 Mood disorder due to known physiological condition, unspecified: Secondary | ICD-10-CM | POA: Diagnosis not present

## 2013-08-02 NOTE — Progress Notes (Signed)
Quick Note:  Spoke to patient's mother and relayed Dilantin level, no change in dosage, per Dr. Terrace Arabia. ______

## 2013-08-27 ENCOUNTER — Other Ambulatory Visit: Payer: Self-pay | Admitting: Neurology

## 2013-08-28 ENCOUNTER — Telehealth: Payer: Self-pay | Admitting: Neurology

## 2013-08-28 NOTE — Telephone Encounter (Signed)
Patient's mother came to office and said that Pharmacy denied patient's Keppra, Dilantin and Topamax.  I checked with Dr. Terrace Arabia and she wants patient to continue those medicines, so I called and gave a verbal order with 6 refills.  I spoke to mother and relayed that medicines have been called in.  She was grateful.

## 2013-09-21 ENCOUNTER — Emergency Department (HOSPITAL_COMMUNITY): Payer: Medicare Other

## 2013-09-21 ENCOUNTER — Emergency Department (HOSPITAL_COMMUNITY)
Admission: EM | Admit: 2013-09-21 | Discharge: 2013-09-21 | Disposition: A | Discharge status: Home / Self Care | Payer: Medicare Other | Attending: Emergency Medicine | Admitting: Emergency Medicine

## 2013-09-21 ENCOUNTER — Encounter (HOSPITAL_COMMUNITY): Payer: Self-pay | Admitting: Emergency Medicine

## 2013-09-21 ENCOUNTER — Other Ambulatory Visit: Payer: Self-pay

## 2013-09-21 DIAGNOSIS — G40909 Epilepsy, unspecified, not intractable, without status epilepticus: Secondary | ICD-10-CM | POA: Insufficient documentation

## 2013-09-21 DIAGNOSIS — R091 Pleurisy: Secondary | ICD-10-CM | POA: Insufficient documentation

## 2013-09-21 DIAGNOSIS — Z79899 Other long term (current) drug therapy: Secondary | ICD-10-CM | POA: Insufficient documentation

## 2013-09-21 DIAGNOSIS — F411 Generalized anxiety disorder: Secondary | ICD-10-CM | POA: Insufficient documentation

## 2013-09-21 DIAGNOSIS — R071 Chest pain on breathing: Secondary | ICD-10-CM | POA: Diagnosis not present

## 2013-09-21 DIAGNOSIS — F17200 Nicotine dependence, unspecified, uncomplicated: Secondary | ICD-10-CM | POA: Insufficient documentation

## 2013-09-21 DIAGNOSIS — F329 Major depressive disorder, single episode, unspecified: Secondary | ICD-10-CM | POA: Diagnosis not present

## 2013-09-21 DIAGNOSIS — R079 Chest pain, unspecified: Secondary | ICD-10-CM | POA: Diagnosis not present

## 2013-09-21 DIAGNOSIS — F3289 Other specified depressive episodes: Secondary | ICD-10-CM | POA: Insufficient documentation

## 2013-09-21 LAB — POCT I-STAT TROPONIN I: Troponin i, poc: 0 ng/mL (ref 0.00–0.08)

## 2013-09-21 LAB — BASIC METABOLIC PANEL
BUN: 10 mg/dL (ref 6–23)
CO2: 21 mEq/L (ref 19–32)
Calcium: 9.2 mg/dL (ref 8.4–10.5)
Chloride: 108 mEq/L (ref 96–112)
Creatinine, Ser: 0.87 mg/dL (ref 0.50–1.35)
GFR calc Af Amer: >90 mL/min (ref >90)
GFR calc non Af Amer: >90 mL/min (ref >90)
Glucose, Bld: 118 mg/dL — ABNORMAL HIGH (ref 70–99)
Potassium: 3.2 mEq/L — ABNORMAL LOW (ref 3.5–5.1)
Sodium: 141 mEq/L (ref 135–145)

## 2013-09-21 LAB — CBC
MCH: 30.2 pg (ref 26.0–34.0)
MCHC: 35.3 g/dL (ref 30.0–36.0)
Platelets: 216 10*3/uL (ref 150–400)
RBC: 4.9 MIL/uL (ref 4.22–5.81)

## 2013-09-21 MED ORDER — NAPROXEN 500 MG PO TABS: 500.0000 mg | ORAL_TABLET | Freq: Two times a day (BID) | ORAL | Status: DC

## 2013-09-21 NOTE — ED Notes (Signed)
Pt c/o right sided CP worse with cough and movement x 3 days; pt wears helmet due to frequent seizures

## 2013-09-21 NOTE — ED Provider Notes (Signed)
CSN: [id number]     Arrival date & time 09/21/13  1426 History   First MD Initiated Contact with Patient 09/21/13 [phone]     Chief Complaint  Patient presents with  . Chest Pain    HPI  Patient presents with pleuritic right-sided chest pain. It is intermittent,  associated with cough or sneeze or "really deep breath". He smokes. Occasionally smokes marijuana. Has history of refractory seizures. He takes multiple antiepileptics. He wears a helmet. He has had no falls or injuries to his chest wall. No hemoptysis. No prolonged immobilization cast splints fractures surgeries malignancies. No history of PEs. No unilateral leg swelling. No fevers. He has not fell ill .  Past Medical History  Diagnosis Date  . Seizures   . Anxiety   . Depression    Past Surgical History  Procedure Laterality Date  . None     Family History  Problem Relation Age of Onset  . Asthma Father    History  Substance Use Topics  . Smoking status: Current Every Day Smoker -- 1.00 packs/day for 20 years    Types: Cigarettes  . Smokeless tobacco: Never Used  . Alcohol Use: No    Review of Systems  Constitutional: Negative for fever, chills, diaphoresis, appetite change and fatigue.  HENT: Negative for mouth sores, sore throat and trouble swallowing.   Eyes: Negative for visual disturbance.  Respiratory: Negative for cough, chest tightness, shortness of breath and wheezing.   Cardiovascular: Positive for chest pain.  Gastrointestinal: Negative for nausea, vomiting, abdominal pain, diarrhea and abdominal distention.  Endocrine: Negative for polydipsia, polyphagia and polyuria.  Genitourinary: Negative for dysuria, frequency and hematuria.  Musculoskeletal: Negative for gait problem.  Skin: Negative for color change, pallor and rash.  Neurological: Negative for dizziness, syncope, light-headedness and headaches.  Hematological: Does not bruise/bleed easily.  Psychiatric/Behavioral: Negative for behavioral  problems and confusion.    Allergies  Review of patient's allergies indicates no known allergies.  Home Medications   Current Outpatient Rx  Name  Route  Sig  Dispense  Refill  . busPIRone (BUSPAR) 15 MG tablet   Oral   Take 15 mg by mouth 3 (three) times daily.          . citalopram (CELEXA) 40 MG tablet   Oral   Take 40 mg by mouth daily.         Marland Kitchen lacosamide (VIMPAT) 50 MG TABS tablet   Oral   Take 100 mg by mouth 2 (two) times daily.         Marland Kitchen levETIRAcetam (KEPPRA) 500 MG tablet   Oral   Take 1,500 mg by mouth 2 (two) times daily.         . phenytoin (DILANTIN) 100 MG ER capsule   Oral   Take 200 mg by mouth 2 (two) times daily.          Marland Kitchen topiramate (TOPAMAX) 100 MG tablet   Oral   Take 100 mg by mouth 2 (two) times daily.         Marland Kitchen topiramate (TOPAMAX) 50 MG tablet   Oral   Take 50 mg by mouth 2 (two) times daily. Take with Topamax 100mg  to equal 150mg  dose.         . naproxen (NAPROSYN) 500 MG tablet   Oral   Take 1 tablet (500 mg total) by mouth 2 (two) times daily.   30 tablet   0    BP 134/79  Pulse 62  Temp(Src) 98.6 F (37 C) (Oral)  Resp 20  Ht 5\' 10"  (1.778 m)  Wt 195 lb 4.8 oz (88.587 kg)  BMI 28.02 kg/m2  SpO2 100% Physical Exam  Constitutional: He is oriented to person, place, and time. He appears well-developed and well-nourished. No distress.  HENT:  Head: Normocephalic.  Gingival hypertrophy   Eyes: Conjunctivae are normal. Pupils are equal, round, and reactive to light. No scleral icterus.  Neck: Normal range of motion. Neck supple. No thyromegaly present.  Cardiovascular: Normal rate and regular rhythm.  Exam reveals no gallop and no friction rub.   No murmur heard. Pulmonary/Chest: Effort normal and breath sounds normal. No respiratory distress. He has no wheezes. He has no rales.  Clear symmetric lungs. No adventitial sounds. No pleural or pericardial friction rubs. No zoster.  Abdominal: Soft. Bowel sounds are  normal. He exhibits no distension. There is no tenderness. There is no rebound.  Musculoskeletal: Normal range of motion.  Neurological: He is alert and oriented to person, place, and time.  Skin: Skin is warm and dry. No rash noted.  Psychiatric: He has a normal mood and affect. His behavior is normal.    ED Course  Procedures (including critical care time) Labs Review Labs Reviewed  BASIC METABOLIC PANEL - Abnormal; Notable for the following:    Potassium 3.2 (*)    Glucose, Bld 118 (*)    All other components within normal limits  CBC  POCT I-STAT TROPONIN I   Imaging Review Dg Chest 2 View  09/21/2013   CLINICAL DATA:  Right-sided chest pain  EXAM: CHEST  2 VIEW  COMPARISON:  None.  FINDINGS: The cardiac shadow is within normal limits. The lungs are clear bilaterally. No acute bony abnormality is seen.  IMPRESSION: No acute abnormality noted.   Electronically Signed   By: Alcide Clever M.D.   On: 09/21/2013 15:59    EKG Interpretation   None       MDM   1. Pleurisy    Normal chest x-ray. Normal troponin. No EKG changes. Symptoms are consistent with pleurisy. He has no risk for PE. Not hypoxemic. Not tachycardic. Plan anti-inflammatories recheck as needed.    Roney Marion, MD 09/21/13 [phone]

## 2013-10-03 ENCOUNTER — Emergency Department (HOSPITAL_COMMUNITY)
Admission: EM | Admit: 2013-10-03 | Discharge: 2013-10-03 | Disposition: A | Discharge status: Home / Self Care | Payer: Medicare Other | Attending: Emergency Medicine | Admitting: Emergency Medicine

## 2013-10-03 ENCOUNTER — Emergency Department (HOSPITAL_COMMUNITY): Payer: Medicare Other

## 2013-10-03 ENCOUNTER — Encounter (HOSPITAL_COMMUNITY): Payer: Self-pay | Admitting: Emergency Medicine

## 2013-10-03 DIAGNOSIS — Y92009 Unspecified place in unspecified non-institutional (private) residence as the place of occurrence of the external cause: Secondary | ICD-10-CM | POA: Insufficient documentation

## 2013-10-03 DIAGNOSIS — F17200 Nicotine dependence, unspecified, uncomplicated: Secondary | ICD-10-CM | POA: Diagnosis not present

## 2013-10-03 DIAGNOSIS — M25519 Pain in unspecified shoulder: Secondary | ICD-10-CM | POA: Diagnosis not present

## 2013-10-03 DIAGNOSIS — S4980XA Other specified injuries of shoulder and upper arm, unspecified arm, initial encounter: Secondary | ICD-10-CM | POA: Insufficient documentation

## 2013-10-03 DIAGNOSIS — R079 Chest pain, unspecified: Secondary | ICD-10-CM | POA: Diagnosis not present

## 2013-10-03 DIAGNOSIS — S46909A Unspecified injury of unspecified muscle, fascia and tendon at shoulder and upper arm level, unspecified arm, initial encounter: Secondary | ICD-10-CM | POA: Insufficient documentation

## 2013-10-03 DIAGNOSIS — F3289 Other specified depressive episodes: Secondary | ICD-10-CM | POA: Insufficient documentation

## 2013-10-03 DIAGNOSIS — R569 Unspecified convulsions: Secondary | ICD-10-CM

## 2013-10-03 DIAGNOSIS — S199XXA Unspecified injury of neck, initial encounter: Secondary | ICD-10-CM | POA: Diagnosis not present

## 2013-10-03 DIAGNOSIS — G40909 Epilepsy, unspecified, not intractable, without status epilepticus: Secondary | ICD-10-CM | POA: Insufficient documentation

## 2013-10-03 DIAGNOSIS — R561 Post traumatic seizures: Secondary | ICD-10-CM | POA: Diagnosis not present

## 2013-10-03 DIAGNOSIS — Z791 Long term (current) use of non-steroidal anti-inflammatories (NSAID): Secondary | ICD-10-CM | POA: Diagnosis not present

## 2013-10-03 DIAGNOSIS — W108XXA Fall (on) (from) other stairs and steps, initial encounter: Secondary | ICD-10-CM | POA: Insufficient documentation

## 2013-10-03 DIAGNOSIS — Z79899 Other long term (current) drug therapy: Secondary | ICD-10-CM | POA: Insufficient documentation

## 2013-10-03 DIAGNOSIS — W19XXXA Unspecified fall, initial encounter: Secondary | ICD-10-CM

## 2013-10-03 DIAGNOSIS — S0993XA Unspecified injury of face, initial encounter: Secondary | ICD-10-CM | POA: Insufficient documentation

## 2013-10-03 DIAGNOSIS — F329 Major depressive disorder, single episode, unspecified: Secondary | ICD-10-CM | POA: Diagnosis not present

## 2013-10-03 DIAGNOSIS — S298XXA Other specified injuries of thorax, initial encounter: Secondary | ICD-10-CM | POA: Diagnosis not present

## 2013-10-03 DIAGNOSIS — Y939 Activity, unspecified: Secondary | ICD-10-CM | POA: Insufficient documentation

## 2013-10-03 DIAGNOSIS — F411 Generalized anxiety disorder: Secondary | ICD-10-CM | POA: Diagnosis not present

## 2013-10-03 DIAGNOSIS — S0990XA Unspecified injury of head, initial encounter: Secondary | ICD-10-CM | POA: Diagnosis not present

## 2013-10-03 DIAGNOSIS — T148XXA Other injury of unspecified body region, initial encounter: Secondary | ICD-10-CM | POA: Diagnosis not present

## 2013-10-03 LAB — CBC
HCT: 41.7 % (ref 39.0–52.0)
MCH: 29.6 pg (ref 26.0–34.0)
Platelets: 224 10*3/uL (ref 150–400)
RBC: 4.93 MIL/uL (ref 4.22–5.81)
RDW: 14.2 % (ref 11.5–15.5)
WBC: 12.1 10*3/uL — ABNORMAL HIGH (ref 4.0–10.5)

## 2013-10-03 LAB — BASIC METABOLIC PANEL
BUN: 14 mg/dL (ref 6–23)
Creatinine, Ser: 0.93 mg/dL (ref 0.50–1.35)
GFR calc Af Amer: >90 mL/min (ref >90)
GFR calc non Af Amer: >90 mL/min (ref >90)

## 2013-10-03 MED ORDER — SODIUM CHLORIDE 0.9 % IV SOLN
300.0000 mg | Freq: Once | INTRAVENOUS | Status: AC
  Administered 2013-10-03: 300 mg via INTRAVENOUS
  Filled 2013-10-03: qty 6

## 2013-10-03 MED ORDER — IBUPROFEN 800 MG PO TABS: 800.0000 mg | ORAL_TABLET | Freq: Once | ORAL | Status: DC

## 2013-10-03 NOTE — ED Notes (Addendum)
[facility] presents with a 42 yo male from home with possible fall from 4 foot landing of stairs and current c/o CP.  Pt was found by mother on dirt landing below stairs without seizure helmet on (hx. Of seizures).  [facility] reports pt was conscious and alert when found; mother brought pt in house and gave naproxen sodium for pain with no relief.  [facility] was called and pain c/o bilateral arm and neck pain.  Pt arrived and expressed new c/o generalized chest and bilateral arm pain.  [facility] reported petit mal seizure in route lasting 20-30 seconds. No obvious signs of deformity or injury from possible fall.

## 2013-10-03 NOTE — ED Provider Notes (Signed)
Patient seen/examined in the Emergency Department in conjunction with Resident Physician Provider Hibma Patient reports bodyaches s/p fall from seizure Exam : awake/alert, maex4, he is in no distress Plan: imaging/labs ordered.  Will follow closely   Joya Gaskins, MD 10/03/13 [phone]

## 2013-10-03 NOTE — ED Provider Notes (Signed)
CSN: [id number]     Arrival date & time 10/03/13  1443 History   First MD Initiated Contact with Patient 10/03/13 1518     Chief Complaint  Patient presents with  . Chest Pain  . Arm Pain   (Consider location/radiation/quality/duration/timing/severity/associated sxs/prior Treatment) HPI [facility] presents with a 42 yo male from home with possible fall from 4 foot landing of stairs and current c/o CP. Pt was found by mother on dirt landing below stairs without seizure helmet on (hx. Of seizures). [facility] reports pt was conscious and alert when found; mother brought pt in house and gave naproxen sodium for pain with no relief.   Onset was sudden, today.  The pain is rated as 8/10, sharp. Pain to upper chest and neck. Modifying factors: worse with movement, palpation.  Associated symptoms: no emesis.  Recent medical care: brought here by EMS who placed c collar.   Past Medical History  Diagnosis Date  . Seizures   . Anxiety   . Depression    Past Surgical History  Procedure Laterality Date  . None     Family History  Problem Relation Age of Onset  . Asthma Father    History  Substance Use Topics  . Smoking status: Current Every Day Smoker -- 1.00 packs/day for 20 years    Types: Cigarettes  . Smokeless tobacco: Never Used  . Alcohol Use: No    Review of Systems Constitutional: Negative for fever.  Eyes: Negative for vision loss.  ENT: Negative for difficulty swallowing.  Cardiovascular: Negative for chest pain. Respiratory: Negative for respiratory distress.  Gastrointestinal:  Negative for vomiting.  Genitourinary: Negative for inability to void.  Musculoskeletal: Negative for gait problem.  Integumentary: Negative for rash.  Neurological: Negative for new focal weakness.     Allergies  Review of patient's allergies indicates no known allergies.  Home Medications   Current Outpatient Rx  Name  Route  Sig  Dispense  Refill  . busPIRone (BUSPAR) 15 MG tablet   Oral    Take 15 mg by mouth 2 (two) times daily.          . citalopram (CELEXA) 40 MG tablet   Oral   Take 40 mg by mouth daily.          Marland Kitchen lacosamide (VIMPAT) 50 MG TABS tablet   Oral   Take 100 mg by mouth 2 (two) times daily.         Marland Kitchen levETIRAcetam (KEPPRA) 500 MG tablet   Oral   Take 1,500 mg by mouth 2 (two) times daily.         . naproxen (NAPROSYN) 500 MG tablet   Oral   Take 500 mg by mouth 2 (two) times daily with a meal.         . phenytoin (DILANTIN) 100 MG ER capsule   Oral   Take 200 mg by mouth 2 (two) times daily.          Marland Kitchen topiramate (TOPAMAX) 100 MG tablet   Oral   Take 100 mg by mouth 2 (two) times daily.         Marland Kitchen topiramate (TOPAMAX) 50 MG tablet   Oral   Take 50 mg by mouth 2 (two) times daily. Take with Topamax 100mg  to equal 150mg  dose.          BP 152/94  Pulse 64  Temp(Src) 98.6 F (37 C) (Oral)  Resp 20  SpO2 100% Physical Exam Nursing note and vitals  reviewed.  Constitutional: Pt is alert and appears stated age. Eyes: No injection, no scleral icterus. HENT: Atraumatic, airway open without erythema or exudate.  Neck: Midline tenderness present. Respiratory: No respiratory distress. Equal breathing bilaterally. Chest: Upper bilateral chest tenderness.  Cardiovascular: Normal rate. Extremities warm and well perfused.  Abdomen: Soft, non-tender. MSK: Extremities are atraumatic without deformity. Skin: No rash, no wounds.   Neuro: No motor nor sensory deficit.     ED Course  Procedures (including critical care time) Labs Review Labs Reviewed  PHENYTOIN LEVEL, TOTAL - Abnormal; Notable for the following:    Phenytoin Lvl 9.8 (*)    All other components within normal limits  CBC - Abnormal; Notable for the following:    WBC 12.1 (*)    All other components within normal limits  BASIC METABOLIC PANEL   Imaging Review Dg Chest 2 View  10/03/2013   CLINICAL DATA:  Chest pain  EXAM: CHEST  2 VIEW  COMPARISON:  09/21/2013   FINDINGS: The heart size and mediastinal contours are within normal limits. Both lungs are clear. The visualized skeletal structures are unremarkable.  IMPRESSION: No active cardiopulmonary disease.   Electronically Signed   By: Alcide Clever M.D.   On: 10/03/2013 16:18   Ct Head Wo Contrast  10/03/2013   CLINICAL DATA:  Status post fall.  History of seizures.  EXAM: CT HEAD WITHOUT CONTRAST  CT CERVICAL SPINE WITHOUT CONTRAST  TECHNIQUE: Multidetector CT imaging of the head and cervical spine was performed following the standard protocol without intravenous contrast. Multiplanar CT image reconstructions of the cervical spine were also generated.  COMPARISON:  Head and cervical spine CT scan 06/06/2013. Head CT scan 05/15/2012.  FINDINGS: CT HEAD FINDINGS  The brain appears normal without infarct, hemorrhage, mass lesion, mass effect, midline shift or abnormal extra-axial fluid collection. There is no hydrocephalus or pneumocephalus. The calvarium is intact. Infiltration of subcutaneous fat over the right occiput is unchanged and may be due to old trauma.  CT CERVICAL SPINE FINDINGS  No fracture or subluxation of the cervical spine is identified. Loss of disc space height with endplate spurring from C4-C7 is again identified, unchanged. Paraspinous soft tissue structures appear normal.  IMPRESSION: No acute finding head or cervical spine.  Cervical spondylosis.   Electronically Signed   By: Drusilla Kanner M.D.   On: 10/03/2013 16:48   Ct Cervical Spine Wo Contrast  10/03/2013   CLINICAL DATA:  Status post fall.  History of seizures.  EXAM: CT HEAD WITHOUT CONTRAST  CT CERVICAL SPINE WITHOUT CONTRAST  TECHNIQUE: Multidetector CT imaging of the head and cervical spine was performed following the standard protocol without intravenous contrast. Multiplanar CT image reconstructions of the cervical spine were also generated.  COMPARISON:  Head and cervical spine CT scan 06/06/2013. Head CT scan 05/15/2012.   FINDINGS: CT HEAD FINDINGS  The brain appears normal without infarct, hemorrhage, mass lesion, mass effect, midline shift or abnormal extra-axial fluid collection. There is no hydrocephalus or pneumocephalus. The calvarium is intact. Infiltration of subcutaneous fat over the right occiput is unchanged and may be due to old trauma.  CT CERVICAL SPINE FINDINGS  No fracture or subluxation of the cervical spine is identified. Loss of disc space height with endplate spurring from C4-C7 is again identified, unchanged. Paraspinous soft tissue structures appear normal.  IMPRESSION: No acute finding head or cervical spine.  Cervical spondylosis.   Electronically Signed   By: Drusilla Kanner M.D.   On: 10/03/2013 16:48  EKG Interpretation     Ventricular Rate:  71 PR Interval:  185 QRS Duration: 94 QT Interval:  414 QTC Calculation: 450 R Axis:   24 Text Interpretation:  Sinus rhythm ST elev, probable normal early repol pattern Baseline wander in lead(s) V3            MDM   1. Seizures   2. Fall, initial encounter    42 y.o. male w/ PMHx of seizure disorder on 4 meds presents after seizure, fall. He complains of MSK pain. Chest pain not c/w medical cause. Pt back to baseline from seizure. I attempted to call patient mom I did not receive answer. CT head without bleed. CT c spine without fracture. CXR without abnormality. Labs unremarkable. Pt given motrin. Pain resolved on re-eval. Pt informed of results. Counseling provided regarding diagnosis, treatment plan, follow up recommendations, and return precautions. Questions answered.       I independently viewed, interpreted, and used in my medical decision making all ordered lab and imaging tests. Medical Decision Making discussed with ED attending Joya Gaskins, MD       Charm Barges, MD 10/04/13 0001

## 2013-10-04 NOTE — ED Provider Notes (Signed)
I have personally seen and examined the patient.  I have discussed the plan of care with the resident.  I have reviewed the documentation on PMH/FH/Soc. History.  I have reviewed the documentation of the resident and agree.  I have reviewed and agree with the ECG interpretation(s) documented by the resident.   Joya Gaskins, MD 10/04/13 0010

## 2013-11-05 ENCOUNTER — Other Ambulatory Visit: Payer: Self-pay | Admitting: Neurology

## 2014-01-21 ENCOUNTER — Ambulatory Visit: Payer: Medicare Other | Admitting: Nurse Practitioner

## 2014-01-26 ENCOUNTER — Other Ambulatory Visit: Payer: Self-pay | Admitting: Neurology

## 2014-01-28 ENCOUNTER — Telehealth: Payer: Self-pay | Admitting: Neurology

## 2014-01-28 NOTE — Telephone Encounter (Signed)
Rx signed and faxed.

## 2014-01-28 NOTE — Telephone Encounter (Signed)
Pt's mom called wanting to get son's refill on VIMPAT 50 MG TABS tablet I advised that they have received the request from Sat.and the req has been sent through, to check with the pharmacy later in the day. Pt's mom said to call her once this has been called in.

## 2014-01-28 NOTE — Telephone Encounter (Signed)
I called back and spoke with mom.  She is aware Rx will be sent as soon as it has been signed.  Request was forwarded to MD this morning.

## 2014-01-28 NOTE — Telephone Encounter (Signed)
Pt's mother called in this morning asking for Pt's Vimpat prescription to be refilled.  This prescription needs to go to the [facility] on [address] in [place].  The Patient has been out of medication since Saturday and his mother asked if this could be rushed.  Please call to let her know once it has been sent.  Thank you

## 2014-03-18 DIAGNOSIS — F0630 Mood disorder due to known physiological condition, unspecified: Secondary | ICD-10-CM | POA: Diagnosis not present

## 2014-03-26 ENCOUNTER — Other Ambulatory Visit: Payer: Self-pay

## 2014-03-26 MED ORDER — PHENYTOIN SODIUM EXTENDED 100 MG PO CAPS: 200.0000 mg | ORAL_CAPSULE | Freq: Two times a day (BID) | ORAL | Status: DC

## 2014-03-27 ENCOUNTER — Encounter: Payer: Self-pay | Admitting: Nurse Practitioner

## 2014-03-27 ENCOUNTER — Ambulatory Visit (INDEPENDENT_AMBULATORY_CARE_PROVIDER_SITE_OTHER): Payer: Medicare Other | Admitting: Nurse Practitioner

## 2014-03-27 ENCOUNTER — Encounter (INDEPENDENT_AMBULATORY_CARE_PROVIDER_SITE_OTHER): Payer: Self-pay

## 2014-03-27 VITALS — BP 111/74 | HR 69 | Ht 69.5 in | Wt 205.0 lb

## 2014-03-27 DIAGNOSIS — F411 Generalized anxiety disorder: Secondary | ICD-10-CM | POA: Diagnosis not present

## 2014-03-27 DIAGNOSIS — R569 Unspecified convulsions: Secondary | ICD-10-CM | POA: Diagnosis not present

## 2014-03-27 DIAGNOSIS — Z5181 Encounter for therapeutic drug level monitoring: Secondary | ICD-10-CM

## 2014-03-27 DIAGNOSIS — F419 Anxiety disorder, unspecified: Secondary | ICD-10-CM

## 2014-03-27 MED ORDER — LACOSAMIDE 150 MG PO TABS: 150.0000 mg | ORAL_TABLET | Freq: Two times a day (BID) | ORAL | Status: DC

## 2014-03-27 NOTE — Patient Instructions (Addendum)
Increase Vimpat to 150 mg twice daily Continue Dilantin 200 mg twice daily, will check level in a.m. Continue Topamax 150 twice daily Continue Keppra 1500 twice daily Followup in 4 months

## 2014-03-27 NOTE — Progress Notes (Signed)
[facility]  PATIENT: Andrew [name] DOB: 08/06/1971   REASON FOR VISIT: Followup for seizure disorder   HISTORY OF PRESENT ILLNESS: Mr. Ortner, 43 year old male returns for followup. He is a history of intractable seizures since 59. He was last seen in this office by Dr. Krista Blue 07/26/2013. He was placed on Vimpat at that time in addition to his other 3 medications for seizure. He had a period of about 2 weeks recently when he was unable to get his Vimpat from the pharmacy and he is having more seizure activity. He has continued to refuse surgery to  remove irritable focus. Over the years he has been on many different medication combinations. He is wearing a helmet today for protection. He had 2 short complex partial seizures during the office visit today. He returns for reevaluation   HISTORY: He has a history of intractable seizure disorder since 43 years old. He has been followed in the office since 1990. 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 He has had EMG monitoring at [facility] in the past that included abnormal baseline EEG due to intermittent predominantly right temporal spike, sharp right,and focal slowing. He continues to refuse surgery to remove irritable focus.  Over the years, he has tried different combination, he has been on current medications  Topamax 100 mg twice a day, +50 mg twice a day, Dilantin 100 mg 2 tablets twice a day, Keppra 500 mg 3 tablets b.i.d, brand name name medications, he is also taking Buspirone 10 mg t.i.d., citalopram 20 mg every day for his depression and angry control,  He continues to have 2-3 seizures every day, complex partial with secondary generalization, he had burst open few helmet, during his seizure, he continued to smoke marijuana and cigarette. He helps his mother taking care of his elderly bed ridden grandmother at home, has raging spells sometimes.  UPDATE Sep 4th 2014:  He denies significant side effects  from medications, most recent Dilantin level was 23.7 in February 2014, Dilantin dosage was decreased from 250 twice a day to current 200 mg twice a day, he had 2 short lasting complex partial seizure at office today   REVIEW OF SYSTEMS: Full 14 system review of systems performed and notable only for those listed, all others are neg:  Constitutional: N/A  Cardiovascular: N/A  Ear/Nose/Throat: N/A  Skin: N/A  Eyes: N/A  Respiratory: N/A  Gastroitestinal: N/A  Hematology/Lymphatic: N/A  Endocrine: N/A Musculoskeletal:N/A  Allergy/Immunology: N/A  Neurological: Seizure, memory loss Psychiatric: Depression  Sleep : NA   ALLERGIES: No Known Allergies  HOME MEDICATIONS: Outpatient Prescriptions Prior to Visit  Medication Sig Dispense Refill  . busPIRone (BUSPAR) 15 MG tablet Take 15 mg by mouth 2 (two) times daily.       . citalopram (CELEXA) 40 MG tablet Take 40 mg by mouth daily.       Marland Kitchen levETIRAcetam (KEPPRA) 500 MG tablet Take 1,500 mg by mouth 2 (two) times daily.      . naproxen (NAPROSYN) 500 MG tablet Take 500 mg by mouth 2 (two) times daily with a meal.      . phenytoin (DILANTIN) 100 MG ER capsule Take 2 capsules (200 mg total) by mouth 2 (two) times daily.  120 capsule  4  . TOPAMAX 100 MG tablet take 1 tablet by mouth twice a day  60 tablet  6  . topiramate (TOPAMAX) 50 MG tablet Take 50 mg by mouth 2 (two) times daily. Take with Topamax 100mg   to equal 150mg  dose.      Marland Kitchen VIMPAT 50 MG TABS tablet TAKE 1 TABLET BY MOUTH TWICE DAILY FOR 1 WEEK , THEN 2 TABLETS TWICE DAILY  120 tablet  2   No facility-administered medications prior to visit.    PAST MEDICAL HISTORY: Past Medical History  Diagnosis Date  . Seizures   . Anxiety   . Depression     PAST SURGICAL HISTORY: Past Surgical History  Procedure Laterality Date  . None      FAMILY HISTORY: Family History  Problem Relation Age of Onset  . Asthma Father     SOCIAL HISTORY: History   Social History  .  Marital Status: Single    Spouse Name: N/A    Number of Children: 0  . Years of Education: 12   Occupational History  .      Disabled   Social History Main Topics  . Smoking status: Current Every Day Smoker -- 1.00 packs/day for 20 years    Types: Cigarettes  . Smokeless tobacco: Never Used  . Alcohol Use: No  . Drug Use: Yes    Special: Marijuana     Comment: once every month.  . Sexual Activity: Not on file   Other Topics Concern  . Not on file   Social History Narrative   Patient lives at home with his mother Cha Gomillion ). Patient is disabled. Patient has high school education.   Left handed.   Caffeine- soda - pepsi four cans daily.   Patients father died of over dose.-40     PHYSICAL EXAM  Filed Vitals:   03/27/14 1006  BP: 111/74  Pulse: 69  Height: 5' 9.5" (1.765 m)  Weight: 205 lb (92.987 kg)   Body mass index is 29.85 kg/(m^2).  Generalized: Well developed, in no acute distress  Head: normocephalic and atraumatic,. gingival hypertrophy, wearing helmet Neck: Supple, no carotid bruits  Cardiac: Regular rate rhythm, no murmur  Musculoskeletal: No deformity   Neurological examination   Mentation: Alert oriented to time, place, history taking. Follows most  commands speech and language fluent  Cranial nerve II-XII: Fundoscopic exam reveals sharp disc margins.Pupils were equal round reactive to light extraocular movements were full, visual field were full on confrontational test. Facial sensation and strength were normal. hearing was intact to finger rubbing bilaterally. Uvula tongue midline. head turning and shoulder shrug were normal and symmetric.Tongue protrusion into cheek strength was normal. Motor: normal bulk and tone, full strength in the BUE, BLE, fine finger movements normal, no pronator drift. No focal weakness Coordination: finger-nose-finger, heel-to-shin bilaterally, no dysmetria Reflexes: Brachioradialis 2/2, biceps 2/2, triceps 2/2,  patellar 2/2, Achilles 2/2, plantar responses were flexor bilaterally. Gait and Station: Rising up from seated position without assistance, narrow based, moderate stride, steady gait, no assistive device   DIAGNOSTIC DATA (LABS, IMAGING, TESTING) - I reviewed patient records, labs, notes, testing and imaging myself where available.  Lab Results  Component Value Date   WBC 12.1* 10/03/2013   HGB 14.6 10/03/2013   HCT 41.7 10/03/2013   MCV 84.6 10/03/2013   PLT 224 10/03/2013      Component Value Date/Time   NA 138 10/03/2013 1547   K 3.8 10/03/2013 1547   CL 107 10/03/2013 1547   CO2 21 10/03/2013 1547   GLUCOSE 87 10/03/2013 1547   BUN 14 10/03/2013 1547   CREATININE 0.93 10/03/2013 1547   CALCIUM 9.3 10/03/2013 1547   PROT 7.3 03/27/2013 1524   ALBUMIN  3.5 03/27/2013 1524   AST 20 03/27/2013 1524   ALT 16 03/27/2013 1524   ALKPHOS 120* 03/27/2013 1524   BILITOT 0.2* 03/27/2013 1524   GFRNONAA >90 10/03/2013 1547   GFRAA >90 10/03/2013 1547    ASSESSMENT AND PLAN  43 y.o. year old male  has a past medical history of Seizures; Anxiety; and Depression. here to follow up.  Increase Vimpat to 150 mg twice daily, RX to mom Continue Dilantin 200 mg twice daily, will check level in a.m. Continue Topamax 150 twice daily Continue Keppra 1500 twice daily Followup in 4 months Dennie Bible, [facility], [facility], APRN  [facility] [address] [place], [place] [address] [phone]

## 2014-03-28 ENCOUNTER — Other Ambulatory Visit (INDEPENDENT_AMBULATORY_CARE_PROVIDER_SITE_OTHER): Payer: Self-pay

## 2014-03-28 DIAGNOSIS — R569 Unspecified convulsions: Secondary | ICD-10-CM

## 2014-03-28 DIAGNOSIS — Z0289 Encounter for other administrative examinations: Secondary | ICD-10-CM

## 2014-03-28 DIAGNOSIS — Z5181 Encounter for therapeutic drug level monitoring: Secondary | ICD-10-CM | POA: Diagnosis not present

## 2014-03-29 LAB — CBC WITH DIFFERENTIAL/PLATELET
Basophils Absolute: 0.1 10*3/uL (ref 0.0–0.2)
Basos: 1 %
EOS ABS: 0.3 10*3/uL (ref 0.0–0.4)
EOS: 3 %
HEMATOCRIT: 39.6 % (ref 37.5–51.0)
Hemoglobin: 13.9 g/dL (ref 12.6–17.7)
LYMPHS ABS: 2.7 10*3/uL (ref 0.7–3.1)
Lymphs: 29 %
MCH: 29.4 pg (ref 26.6–33.0)
MCHC: 35.1 g/dL (ref 31.5–35.7)
MCV: 84 fL (ref 79–97)
MONOS ABS: 0.9 10*3/uL (ref 0.1–0.9)
Monocytes: 10 %
Neutrophils Absolute: 5.2 10*3/uL (ref 1.4–7.0)
Neutrophils Relative %: 57 %
RBC: 4.72 x10E6/uL (ref 4.14–5.80)
RDW: 14 % (ref 12.3–15.4)
WBC: 9.1 10*3/uL (ref 3.4–10.8)

## 2014-03-29 LAB — COMPREHENSIVE METABOLIC PANEL
ALBUMIN: 3.8 g/dL (ref 3.5–5.5)
ALK PHOS: 111 IU/L (ref 39–117)
ALT: 14 IU/L (ref 0–44)
AST: 14 IU/L (ref 0–40)
Albumin/Globulin Ratio: 1.3 (ref 1.1–2.5)
BUN / CREAT RATIO: 11 (ref 9–20)
BUN: 10 mg/dL (ref 6–24)
CHLORIDE: 110 mmol/L — AB (ref 96–108)
CO2: 18 mmol/L (ref 18–29)
Calcium: 9.2 mg/dL (ref 8.7–10.2)
Creatinine, Ser: 0.94 mg/dL (ref 0.76–1.27)
GFR calc non Af Amer: 100 mL/min/{1.73_m2} (ref >59)
GFR, EST AFRICAN AMERICAN: 115 mL/min/{1.73_m2} (ref >59)
GLUCOSE: 81 mg/dL (ref 65–99)
Globulin, Total: 2.9 g/dL (ref 1.5–4.5)
POTASSIUM: 4.3 mmol/L (ref 3.5–5.2)
Sodium: 139 mmol/L (ref 134–144)
TOTAL PROTEIN: 6.7 g/dL (ref 6.0–8.5)
Total Bilirubin: <0.2 mg/dL (ref 0.0–1.2)

## 2014-03-29 LAB — PHENYTOIN LEVEL, TOTAL: PHENYTOIN LVL: 10.4 ug/mL (ref 10.0–20.0)

## 2014-03-29 NOTE — Progress Notes (Signed)
Quick Note:  Spoke to mom and relayed lab results, per [place]. ______

## 2014-04-11 ENCOUNTER — Other Ambulatory Visit: Payer: Self-pay | Admitting: Neurology

## 2014-04-30 ENCOUNTER — Other Ambulatory Visit: Payer: Self-pay | Admitting: Nurse Practitioner

## 2014-05-29 ENCOUNTER — Other Ambulatory Visit: Payer: Self-pay

## 2014-05-29 MED ORDER — TOPIRAMATE 50 MG PO TABS: 50.0000 mg | ORAL_TABLET | Freq: Two times a day (BID) | ORAL | Status: DC

## 2014-05-29 MED ORDER — TOPIRAMATE 100 MG PO TABS: ORAL_TABLET | ORAL | Status: DC

## 2014-07-09 ENCOUNTER — Other Ambulatory Visit: Payer: Self-pay

## 2014-07-09 MED ORDER — KEPPRA 500 MG PO TABS: ORAL_TABLET | ORAL | Status: DC

## 2014-07-30 ENCOUNTER — Ambulatory Visit: Payer: Medicare Other | Admitting: Nurse Practitioner

## 2014-08-20 ENCOUNTER — Other Ambulatory Visit: Payer: Self-pay

## 2014-08-20 MED ORDER — PHENYTOIN SODIUM EXTENDED 100 MG PO CAPS: 200.0000 mg | ORAL_CAPSULE | Freq: Two times a day (BID) | ORAL | Status: DC

## 2014-08-26 ENCOUNTER — Other Ambulatory Visit: Payer: Self-pay | Admitting: Nurse Practitioner

## 2014-08-29 NOTE — Telephone Encounter (Signed)
Patient's mother Lelan Pons calling to request patient's Vimpat refill to be sent to the [facility] on [address], please return call and advise.

## 2014-09-04 ENCOUNTER — Encounter (INDEPENDENT_AMBULATORY_CARE_PROVIDER_SITE_OTHER): Payer: Self-pay

## 2014-09-04 ENCOUNTER — Ambulatory Visit (INDEPENDENT_AMBULATORY_CARE_PROVIDER_SITE_OTHER): Payer: Medicare Other | Admitting: Nurse Practitioner

## 2014-09-04 ENCOUNTER — Encounter: Payer: Self-pay | Admitting: Nurse Practitioner

## 2014-09-04 VITALS — BP 136/90 | HR 65 | Wt 197.2 lb

## 2014-09-04 DIAGNOSIS — F419 Anxiety disorder, unspecified: Secondary | ICD-10-CM | POA: Diagnosis not present

## 2014-09-04 DIAGNOSIS — R569 Unspecified convulsions: Secondary | ICD-10-CM | POA: Diagnosis not present

## 2014-09-04 MED ORDER — PHENYTOIN SODIUM EXTENDED 100 MG PO CAPS: 200.0000 mg | ORAL_CAPSULE | Freq: Two times a day (BID) | ORAL | Status: DC

## 2014-09-04 NOTE — Patient Instructions (Signed)
Continue Topamax 150 mg twice daily Continue Dilantin 2 tablets twice daily Will refill Continue Keppra 500mg  3 tablets twice daily BRAND Continue Vimpat 150 mg twice daily Followup in 6 months, next visit with Dr. Krista Blue

## 2014-09-04 NOTE — Progress Notes (Signed)
[facility]  PATIENT: Andrew [name] DOB: July 05, 1971   REASON FOR VISIT: for seizure disorder   HISTORY OF PRESENT ILLNESS:[name], 43 year old male returns for followup. He was last seen 5/6/ 2015. He has a history of intractable seizures since 12.  Vimpat was added to his regime in September 2014 in addition to his other 3 medications for seizure. His Vimpat was increased to 150mg  BID at his last visit with rare seizure event since that time.  He has continued to refuse surgery to remove irritable focus. Over the years he has been on many different medication combinations. He is wearing a helmet today for protection. Last Dilantin level was 10.4 in May  He returns for reevaluation  with his mom .  HISTORY: He has a history of intractable seizure disorder since 43 years old. He has been followed in the office since 1990. 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 He has had EMG monitoring at [facility] in the past that included abnormal baseline EEG due to intermittent predominantly right temporal spike, sharp right,and focal slowing. He continues to refuse surgery to remove irritable focus.  Over the years, he has tried different combination, he has been on current medications  Topamax 100 mg twice a day, +50 mg twice a day, Dilantin 100 mg 2 tablets twice a day, Keppra 500 mg 3 tablets b.i.d, brand name name medications, he is also taking Buspirone 10 mg t.i.d., citalopram 20 mg every day for his depression and angry control,  He continues to have 2-3 seizures every day, complex partial with secondary generalization, he had burst open few helmet, during his seizure, he continued to smoke marijuana and cigarette. He helps his mother taking care of his elderly bed ridden grandmother at home, has raging spells sometimes.     REVIEW OF SYSTEMS: Full 14 system review of systems performed and notable only for those listed, all others are neg:  Constitutional:  N/A  Cardiovascular: N/A  Ear/Nose/Throat: N/A  Skin: N/A  Eyes: N/A  Respiratory: N/A  Gastroitestinal: N/A  Hematology/Lymphatic: N/A  Endocrine: N/A Musculoskeletal:N/A  Allergy/Immunology: N/A  Neurological: N/A Psychiatric: N/A Sleep : NA   ALLERGIES: No Known Allergies  HOME MEDICATIONS: Outpatient Prescriptions Prior to Visit  Medication Sig Dispense Refill  . busPIRone (BUSPAR) 15 MG tablet Take 15 mg by mouth 2 (two) times daily.       . citalopram (CELEXA) 40 MG tablet Take 40 mg by mouth daily.       Marland Kitchen KEPPRA 500 MG tablet take 3 tablets by mouth twice a day Brand Medically Necessary  180 tablet  5  . phenytoin (DILANTIN) 100 MG ER capsule Take 2 capsules (200 mg total) by mouth 2 (two) times daily.  120 capsule  0  . topiramate (TOPAMAX) 100 MG tablet take 1 tablet by mouth twice a day with 50mg  to total 150mg  bid  60 tablet  5  . topiramate (TOPAMAX) 50 MG tablet Take 1 tablet (50 mg total) by mouth 2 (two) times daily. Take with Topamax 100mg  to equal 150mg  dose.  60 tablet  5  . VIMPAT 150 MG TABS take 1 tablet by mouth twice a day  60 tablet  3  . naproxen (NAPROSYN) 500 MG tablet Take 500 mg by mouth 2 (two) times daily with a meal.       No facility-administered medications prior to visit.    PAST MEDICAL HISTORY: Past Medical History  Diagnosis Date  . Seizures   .  Anxiety   . Depression     PAST SURGICAL HISTORY: Past Surgical History  Procedure Laterality Date  . None      FAMILY HISTORY: Family History  Problem Relation Age of Onset  . Asthma Father     SOCIAL HISTORY: History   Social History  . Marital Status: Single    Spouse Name: N/A    Number of Children: 0  . Years of Education: 12   Occupational History  .      Disabled   Social History Main Topics  . Smoking status: Current Every Day Smoker -- 1.00 packs/day for 20 years    Types: Cigarettes  . Smokeless tobacco: Never Used  . Alcohol Use: No  . Drug Use: Yes     Special: Marijuana     Comment: once every month.  . Sexual Activity: Not on file   Other Topics Concern  . Not on file   Social History Narrative   Patient lives at home with his mother Andrew [name] ). Patient is disabled. Patient has high school education.   Left handed.   Caffeine- soda - pepsi four cans daily.   Patients father died of over dose.-40     PHYSICAL EXAM  Filed Vitals:   09/04/14 1520  BP: 136/90  Pulse: 65  Weight: 197 lb 3.2 oz (89.449 kg)   Body mass index is 28.71 kg/(m^2). Generalized: Well developed, in no acute distress  Head: normocephalic and atraumatic,. gingival hypertrophy, wearing helmet  Neck: Supple, no carotid bruits  Musculoskeletal: No deformity  Neurological examination  Mentation: Alert oriented to time, place, history taking. Follows most commands speech and language fluent  Cranial nerve II-XII: Pupils were equal round reactive to light extraocular movements were full, visual field were full on confrontational test. Facial sensation and strength were normal. hearing was intact to finger rubbing bilaterally. Uvula tongue midline. head turning and shoulder shrug were normal and symmetric.Tongue protrusion into cheek strength was normal.  Motor: normal bulk and tone, full strength in the BUE, BLE, fine finger movements normal, no pronator drift. No focal weakness  Coordination: finger-nose-finger, heel-to-shin bilaterally, no dysmetria  Reflexes: Brachioradialis 2/2, biceps 2/2, triceps 2/2, patellar 2/2, Achilles 2/2, plantar responses were flexor bilaterally.  Gait and Station: Rising up from seated position without assistance, narrow based, moderate stride, steady gait, no assistive device        DIAGNOSTIC DATA (LABS, IMAGING, TESTING) - I reviewed patient records, labs, notes, testing and imaging myself where available.  Lab Results  Component Value Date   WBC 9.1 03/28/2014   HGB 13.9 03/28/2014   HCT 39.6 03/28/2014   MCV 84 03/28/2014    PLT 224 10/03/2013      Component Value Date/Time   NA 139 03/28/2014 0815   NA 138 10/03/2013 1547   K 4.3 03/28/2014 0815   CL 110* 03/28/2014 0815   CO2 18 03/28/2014 0815   GLUCOSE 81 03/28/2014 0815   GLUCOSE 87 10/03/2013 1547   BUN 10 03/28/2014 0815   BUN 14 10/03/2013 1547   CREATININE 0.94 03/28/2014 0815   CALCIUM 9.2 03/28/2014 0815   PROT 6.7 03/28/2014 0815   PROT 7.3 03/27/2013 1524   ALBUMIN 3.5 03/27/2013 1524   AST 14 03/28/2014 0815   ALT 14 03/28/2014 0815   ALKPHOS 111 03/28/2014 0815   BILITOT <0.2 03/28/2014 0815   GFRNONAA 100 03/28/2014 0815   GFRAA 115 03/28/2014 0815    ASSESSMENT AND PLAN  43 y.o.  year old male  has a past medical history of Seizures; Anxiety; and Depression. here  to followup. He is  on 4 seizure medications.  Continue Topamax 150 mg twice daily Continue Dilantin 2 tablets twice daily Will refill Continue Keppra 500mg  3 tablets twice daily BRAND Continue Vimpat 150 mg twice daily Followup in 6 months, next visit with Dr. Luan Pulling, [facility], [facility], [facility] [address] [place], [place] [address] [phone]

## 2014-09-18 DIAGNOSIS — F331 Major depressive disorder, recurrent, moderate: Secondary | ICD-10-CM | POA: Diagnosis not present

## 2014-11-13 ENCOUNTER — Other Ambulatory Visit: Payer: Self-pay | Admitting: Neurology

## 2015-01-28 ENCOUNTER — Other Ambulatory Visit: Payer: Self-pay | Admitting: *Deleted

## 2015-01-28 MED ORDER — LACOSAMIDE 150 MG PO TABS: 1.0000 | ORAL_TABLET | Freq: Two times a day (BID) | ORAL | Status: DC

## 2015-03-06 ENCOUNTER — Encounter: Payer: Self-pay | Admitting: Neurology

## 2015-03-06 ENCOUNTER — Ambulatory Visit (INDEPENDENT_AMBULATORY_CARE_PROVIDER_SITE_OTHER): Payer: Medicare Other | Admitting: Neurology

## 2015-03-06 VITALS — BP 117/78 | HR 69 | Ht 69.5 in | Wt 202.0 lb

## 2015-03-06 DIAGNOSIS — F419 Anxiety disorder, unspecified: Secondary | ICD-10-CM

## 2015-03-06 DIAGNOSIS — R569 Unspecified convulsions: Secondary | ICD-10-CM

## 2015-03-06 MED ORDER — TOPIRAMATE 100 MG PO TABS: ORAL_TABLET | ORAL | Status: DC

## 2015-03-06 MED ORDER — PHENYTOIN SODIUM EXTENDED 100 MG PO CAPS: 200.0000 mg | ORAL_CAPSULE | Freq: Two times a day (BID) | ORAL | Status: DC

## 2015-03-06 MED ORDER — KEPPRA 500 MG PO TABS: ORAL_TABLET | ORAL | Status: DC

## 2015-03-06 MED ORDER — LACOSAMIDE 150 MG PO TABS: 1.0000 | ORAL_TABLET | Freq: Two times a day (BID) | ORAL | Status: DC

## 2015-03-06 MED ORDER — TOPIRAMATE 50 MG PO TABS: 50.0000 mg | ORAL_TABLET | Freq: Two times a day (BID) | ORAL | Status: DC

## 2015-03-06 NOTE — Progress Notes (Signed)
[facility]  PATIENT: Andrew [name] DOB: 20-Mar-1971   REASON FOR VISIT: Follow-up seizure disorder   HISTORY OF PRESENT ILLNESS:[name], 44 year old male returns for followup. He is with his mother   He has a history of intractable seizure disorder since 44 years old. He has been followed in the office since 1990. 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  He has had EMU monitoring at [facility] in the past that included abnormal baseline EEG due to intermittent predominantly right temporal spike, sharp right,and focal slowing. He continues to refuse surgery to remove irritable focus.  Over the years, he has tried different combination, he has been on current medications  Topamax 100 mg twice a day, +50 mg twice a day, Dilantin 100 mg 2 tablets twice a day, Keppra 500 mg 3 tablets b.i.d, brand name name   he is also taking Buspirone 10 mg t.i.d., citalopram 20 mg every day for his depression and angry control,  He continues to have 2-3 seizures every day, complex partial with secondary generalization, he had burst open few helmet due to his seizure, he continued to smoke marijuana and cigarette. He helps his mother taking care of his elderly bed ridden grandmother at home, has raging spells sometimes.   UPDATE April 14th 2016: He had seizure March 04 2015, overall his seizure is under much better control adding Vimpat 150 mg twice a day, he has gingival hypertrophy, but mother is overall very  happy about his seizure control, does not want to change his medications,  Topamax 100 mg twice a day, +50 mg twice a day, Dilantin 100 mg 2 tablets twice a day, Keppra 500 mg 3 tablets b.i.d, brand name name, Vimpat 150 twice a day  REVIEW OF SYSTEMS: Full 14 system review of systems performed and notable only for those listed, all others are neg: Seizure, confusion, depression    ALLERGIES: No Known Allergies  HOME MEDICATIONS: Outpatient Prescriptions  Prior to Visit  Medication Sig Dispense Refill  . busPIRone (BUSPAR) 15 MG tablet Take 15 mg by mouth 2 (two) times daily.     . citalopram (CELEXA) 40 MG tablet Take 40 mg by mouth daily.     Marland Kitchen KEPPRA 500 MG tablet take 3 tablets by mouth twice a day Brand Medically Necessary 180 tablet 5  . Lacosamide (VIMPAT) 150 MG TABS Take 1 tablet (150 mg total) by mouth 2 (two) times daily. 60 tablet 1  . phenytoin (DILANTIN) 100 MG ER capsule Take 2 capsules (200 mg total) by mouth 2 (two) times daily. 120 capsule 6  . topiramate (TOPAMAX) 100 MG tablet take 1 tablet by mouth twice a day with 50mg  to total 150mg  bid 60 tablet 5  . topiramate (TOPAMAX) 50 MG tablet take 1 tablet by mouth twice a day 60 tablet 6   No facility-administered medications prior to visit.    PAST MEDICAL HISTORY: Past Medical History  Diagnosis Date  . Seizures   . Anxiety   . Depression     PAST SURGICAL HISTORY: Past Surgical History  Procedure Laterality Date  . None      FAMILY HISTORY: Family History  Problem Relation Age of Onset  . Asthma Father     SOCIAL HISTORY: History   Social History  . Marital Status: Single    Spouse Name: N/A  . Number of Children: 0  . Years of Education: 12   Occupational History  .      Disabled  Social History Main Topics  . Smoking status: Current Every Day Smoker -- 1.00 packs/day for 20 years    Types: Cigarettes  . Smokeless tobacco: Never Used  . Alcohol Use: No  . Drug Use: Yes    Special: Marijuana     Comment: Generally, two weeks out of every month or until his monthly supply has run out.  Marland Kitchen Sexual Activity: Not on file   Other Topics Concern  . Not on file   Social History Narrative   Patient lives at home with his mother Andrew [name] ). Patient is disabled. Patient has high school education.   Left handed.   Caffeine- soda - pepsi four cans daily.   Patients father died of over dose.-40     PHYSICAL EXAM  Filed Vitals:   03/06/15  1130  BP: 117/78  Pulse: 69  Height: 5' 9.5" (1.765 m)  Weight: 202 lb (91.627 kg)   Body mass index is 29.41 kg/(m^2).  PHYSICAL EXAMNIATION:  Gen: NAD, conversant, well nourised, obese, well groomed                     Cardiovascular: Regular rate rhythm, no peripheral edema, warm, nontender. Eyes: Conjunctivae clear without exudates or hemorrhage Neck: Supple, no carotid bruise. Pulmonary: Clear to auscultation bilaterally   NEUROLOGICAL EXAM:  MENTAL STATUS: Speech:    Speech is normal; fluent and spontaneous with normal comprehension.  Cognition:    The patient is oriented to person, place, and time;     Wearing a helmet  CRANIAL NERVES: CN II: Visual fields are full to confrontation. Fundoscopic exam is normal with sharp discs and no vascular changes. Venous pulsations are present bilaterally. Pupils are 4 mm and briskly reactive to light. Visual acuity is 20/20 bilaterally. CN III, IV, VI: extraocular movement are normal. No ptosis. CN V: Facial sensation is intact to pinprick in all 3 divisions bilaterally. Corneal responses are intact.  CN VII: Face is symmetric with normal eye closure and smile. CN VIII: Hearing is normal to rubbing fingers CN IX, X: Palate elevates symmetrically. Phonation is normal. CN XI: Head turning and shoulder shrug are intact CN XII: Tongue is midline with normal movements and no atrophy. Significant gingival hypertrophy  MOTOR: There is no pronator drift of out-stretched arms. Muscle bulk and tone are normal. Muscle strength is normal.   Shoulder abduction Shoulder external rotation Elbow flexion Elbow extension Wrist flexion Wrist extension Finger abduction Hip flexion Knee flexion Knee extension Ankle dorsi flexion Ankle plantar flexion  R 5 5 5 5 5 5 5 5 5 5 5 5   L 5 5 5 5 5 5 5 5 5 5 5 5     REFLEXES: Reflexes are 2+ and symmetric at the biceps, triceps, knees, and ankles. Plantar responses are flexor.  SENSORY: Light touch,  pinprick, position sense, and vibration sense are intact in fingers and toes.  COORDINATION: Rapid alternating movements and fine finger movements are intact. There is no dysmetria on finger-to-nose and heel-knee-shin. There are no abnormal or extraneous movements.   GAIT/STANCE: Posture is normal. Gait is steady with normal steps, base, arm swing, and turning. Heel and toe walking are normal. Tandem gait is normal.  Romberg is absent.        DIAGNOSTIC DATA (LABS, IMAGING, TESTING) - I reviewed patient records, labs, notes, testing and imaging myself where available.  Lab Results  Component Value Date   WBC 9.1 03/28/2014   HGB 13.9 03/28/2014  HCT 39.6 03/28/2014   MCV 84 03/28/2014   PLT 224 10/03/2013      Component Value Date/Time   NA 139 03/28/2014 0815   NA 138 10/03/2013 1547   K 4.3 03/28/2014 0815   CL 110* 03/28/2014 0815   CO2 18 03/28/2014 0815   GLUCOSE 81 03/28/2014 0815   GLUCOSE 87 10/03/2013 1547   BUN 10 03/28/2014 0815   BUN 14 10/03/2013 1547   CREATININE 0.94 03/28/2014 0815   CALCIUM 9.2 03/28/2014 0815   PROT 6.7 03/28/2014 0815   PROT 7.3 03/27/2013 1524   ALBUMIN 3.5 03/27/2013 1524   AST 14 03/28/2014 0815   ALT 14 03/28/2014 0815   ALKPHOS 111 03/28/2014 0815   BILITOT <0.2 03/28/2014 0815   GFRNONAA 100 03/28/2014 0815   GFRAA 115 03/28/2014 0815    ASSESSMENT AND PLAN  44 y.o. year old male  has a past medical history of Seizures; Anxiety; and Depression. here  to followup. He is  on 4 seizure medications, he continue have recurrent seizures, also developed gingival hypertrophy  Continue Topamax 100+50 mg twice daily Continue Dilantin 100mg  2 tablets twice daily Will refill Continue Keppra 500mg  3 tablets twice daily BRAND Continue Vimpat 150 mg twice daily Followup in 6 months with Rhae Hammock, M.D. Ph.D.  [facility] [place], [place] [address] Phone: [phone] Fax:       [phone]

## 2015-03-07 ENCOUNTER — Other Ambulatory Visit (INDEPENDENT_AMBULATORY_CARE_PROVIDER_SITE_OTHER): Payer: Self-pay

## 2015-03-07 DIAGNOSIS — R569 Unspecified convulsions: Secondary | ICD-10-CM

## 2015-03-07 DIAGNOSIS — F419 Anxiety disorder, unspecified: Secondary | ICD-10-CM

## 2015-03-07 DIAGNOSIS — Z0289 Encounter for other administrative examinations: Secondary | ICD-10-CM

## 2015-03-08 LAB — PHENYTOIN LEVEL, TOTAL: Phenytoin Lvl: 10.4 ug/mL (ref 10.0–20.0)

## 2015-03-08 LAB — CBC WITH DIFFERENTIAL/PLATELET
Basophils Absolute: 0 10*3/uL (ref 0.0–0.2)
Basos: 0 %
EOS: 3 %
Eosinophils Absolute: 0.3 10*3/uL (ref 0.0–0.4)
HEMATOCRIT: 43.9 % (ref 37.5–51.0)
Hemoglobin: 14.5 g/dL (ref 12.6–17.7)
IMMATURE GRANS (ABS): 0 10*3/uL (ref 0.0–0.1)
IMMATURE GRANULOCYTES: 0 %
LYMPHS: 34 %
Lymphocytes Absolute: 3 10*3/uL (ref 0.7–3.1)
MCH: 28.8 pg (ref 26.6–33.0)
MCHC: 33 g/dL (ref 31.5–35.7)
MCV: 87 fL (ref 79–97)
MONOCYTES: 9 %
MONOS ABS: 0.8 10*3/uL (ref 0.1–0.9)
NEUTROS PCT: 54 %
Neutrophils Absolute: 4.7 10*3/uL (ref 1.4–7.0)
Platelets: 278 10*3/uL (ref 150–379)
RBC: 5.03 x10E6/uL (ref 4.14–5.80)
RDW: 14.3 % (ref 12.3–15.4)
WBC: 8.8 10*3/uL (ref 3.4–10.8)

## 2015-03-08 LAB — COMPREHENSIVE METABOLIC PANEL
A/G RATIO: 1.4 (ref 1.1–2.5)
ALT: 20 IU/L (ref 0–44)
AST: 15 IU/L (ref 0–40)
Albumin: 4.1 g/dL (ref 3.5–5.5)
Alkaline Phosphatase: 116 IU/L (ref 39–117)
BUN/Creatinine Ratio: 11 (ref 9–20)
BUN: 10 mg/dL (ref 6–24)
CO2: 20 mmol/L (ref 18–29)
Calcium: 9.2 mg/dL (ref 8.7–10.2)
Chloride: 107 mmol/L (ref 97–108)
Creatinine, Ser: 0.91 mg/dL (ref 0.76–1.27)
GFR calc Af Amer: 119 mL/min/{1.73_m2} (ref >59)
GFR, EST NON AFRICAN AMERICAN: 103 mL/min/{1.73_m2} (ref >59)
Globulin, Total: 3 g/dL (ref 1.5–4.5)
Glucose: 88 mg/dL (ref 65–99)
Potassium: 4.3 mmol/L (ref 3.5–5.2)
Sodium: 142 mmol/L (ref 134–144)
TOTAL PROTEIN: 7.1 g/dL (ref 6.0–8.5)

## 2015-03-19 DIAGNOSIS — F331 Major depressive disorder, recurrent, moderate: Secondary | ICD-10-CM | POA: Diagnosis not present

## 2015-09-05 ENCOUNTER — Ambulatory Visit: Payer: Medicare Other | Admitting: Nurse Practitioner

## 2015-09-08 ENCOUNTER — Encounter: Payer: Self-pay | Admitting: Nurse Practitioner

## 2015-09-08 ENCOUNTER — Ambulatory Visit (INDEPENDENT_AMBULATORY_CARE_PROVIDER_SITE_OTHER): Payer: Medicare Other | Admitting: Nurse Practitioner

## 2015-09-08 ENCOUNTER — Telehealth: Payer: Self-pay | Admitting: *Deleted

## 2015-09-08 VITALS — BP 143/96 | HR 59 | Ht 69.5 in | Wt 201.6 lb

## 2015-09-08 DIAGNOSIS — R569 Unspecified convulsions: Secondary | ICD-10-CM

## 2015-09-08 DIAGNOSIS — F419 Anxiety disorder, unspecified: Secondary | ICD-10-CM | POA: Diagnosis not present

## 2015-09-08 MED ORDER — LACOSAMIDE 150 MG PO TABS: 1.0000 | ORAL_TABLET | Freq: Two times a day (BID) | ORAL | Status: DC

## 2015-09-08 NOTE — Patient Instructions (Addendum)
Continue Topamax 100+50 mg twice daily Continue Dilantin 100mg  2 tablets twice daily Continue Keppra 500mg  3 tablets twice daily BRAND Continue Vimpat 150 mg twice daily will refill Stop smoking for overall health and well-being Followup in 6 months

## 2015-09-08 NOTE — Telephone Encounter (Signed)
-----   Message from Dennie Bible, NP sent at 09/08/2015  2:27 PM EDT ----- See below please call the MOM ----- Message -----    From: Yates Decamp, CPHT    Sent: 09/08/2015   1:58 PM      To: Dennie Bible, NP  I called [facility] and spoke with pharmacist, Ronalee Belts.  He verified the patient has plenty of refills remaining, and say in fact some of them are ready for pick up now.  He could not find any issues with Rx's... Let me know if you need anything else! Have a good week! =) Jess

## 2015-09-08 NOTE — Telephone Encounter (Signed)
I called and spoke to mother and relayed the message below.  She will get the refills ready, and call us back if any other problems or concerns relating to pts medications.

## 2015-09-08 NOTE — Progress Notes (Signed)
[facility]  PATIENT: Andrew [name] DOB: [birth year]   REASON FOR VISIT: Seizure disorder  Intractable, anxiety disorder HISTORY FROM: Patient and mother    HISTORY OF PRESENT ILLNESS:[name], 44 year old male returns for followup. He is with his motherHe has a history of intractable seizure disorder since 43 years old. He has been followed in the office since 1990. 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  He has had EMU monitoring at [facility] in the past that included abnormal baseline EEG due to intermittent predominantly right temporal spike, sharp right,and focal slowing. He continues to refuse surgery to remove irritable focus.  Over the years, he has tried different combination, he has been on current medications  Topamax 100 mg twice a day, +50 mg twice a day, Dilantin 100 mg 2 tablets twice a day, Keppra 500 mg 3 tablets b.i.d, brand name   He is also taking Buspirone 10 mg t.i.d., citalopram 20 mg every day for his depression and angry control prescribed by psychiatry.  He continues to have 2-3 seizures every day, complex partial with secondary generalization, he had burst open few helmet due to his seizure, he continued to smoke marijuana and cigarette. He helps his mother taking care of his elderly bed ridden grandmother at home, has raging spells sometimes.   UPDATE October 17 ,2016: Andrew [name], 44 year old male returns for follow-up. He has intractable seizure disorder, overall his seizures are in  much better control adding Vimpat 150 mg twice a day, he has gingival hypertrophy, but mother is overall very happy about his seizure control, does not want to change his medications.  Topamax 100 mg twice a day, +50 mg twice a day, Dilantin 100 mg 2 tablets twice a day, Keppra 500 mg 3 tablets b.i.d, brand name name, Vimpat 150 twice a day he needs refills of his Vimpat only. He wears a helmet to prevent head injury during seizure events.  He returns for reevaluation. He has a SCAT form for transportation to be filled out.   REVIEW OF SYSTEMS: Full 14 system review of systems performed and notable only for those listed, all others are neg:  Constitutional: neg  Cardiovascular: neg Ear/Nose/Throat: neg  Skin: neg Eyes: neg Respiratory: neg Gastroitestinal: neg  Hematology/Lymphatic: neg  Endocrine: neg Musculoskeletal:neg Allergy/Immunology: neg Neurological: Occasional headache, seizure disorder and intractable Psychiatric: neg Sleep : neg   ALLERGIES: No Known Allergies  HOME MEDICATIONS: Outpatient Prescriptions Prior to Visit  Medication Sig Dispense Refill  . busPIRone (BUSPAR) 15 MG tablet Take 15 mg by mouth 2 (two) times daily.     . citalopram (CELEXA) 40 MG tablet Take 40 mg by mouth daily.     Marland Kitchen KEPPRA 500 MG tablet take 3 tablets by mouth twice a day Brand Medically Necessary 540 tablet 3  . Lacosamide (VIMPAT) 150 MG TABS Take 1 tablet (150 mg total) by mouth 2 (two) times daily. 180 tablet 3  . phenytoin (DILANTIN) 100 MG ER capsule Take 2 capsules (200 mg total) by mouth 2 (two) times daily. 360 capsule 3  . topiramate (TOPAMAX) 100 MG tablet take 1 tablet by mouth twice a day with 50mg  to total 150mg  bid 180 tablet 3  . topiramate (TOPAMAX) 50 MG tablet Take 1 tablet (50 mg total) by mouth 2 (two) times daily. 180 tablet 3   No facility-administered medications prior to visit.    PAST MEDICAL HISTORY: Past Medical History  Diagnosis Date  . Seizures ([place])   . Anxiety   .  Depression     PAST SURGICAL HISTORY: Past Surgical History  Procedure Laterality Date  . None      FAMILY HISTORY: Family History  Problem Relation Age of Onset  . Asthma Father     SOCIAL HISTORY: Social History   Social History  . Marital Status: Single    Spouse Name: N/A  . Number of Children: 0  . Years of Education: 12   Occupational History  .      Disabled   Social History Main Topics  .  Smoking status: Current Every Day Smoker -- 0.50 packs/day for 20 years    Types: Cigarettes  . Smokeless tobacco: Never Used  . Alcohol Use: No  . Drug Use: Yes    Special: Marijuana     Comment: Generally, two weeks out of every month or until his monthly supply has run out.  Marland Kitchen Sexual Activity: Not on file   Other Topics Concern  . Not on file   Social History Narrative   Patient lives at home with his mother Andrew [name] ). Patient is disabled. Patient has high school education.   Left handed.   Caffeine- soda - pepsi four cans daily.   Patients father died of over dose.-40     PHYSICAL EXAM  Filed Vitals:   09/08/15 1245  BP: 143/96  Pulse: 59  Height: 5' 9.5" (1.765 m)  Weight: 201 lb 9.6 oz (91.445 kg)   Body mass index is 29.35 kg/(m^2). Generalized: Well developed, in no acute distress  Head: normocephalic and atraumatic,. gingival hypertrophy, wearing helmet  Neck: Supple, no carotid bruits  Musculoskeletal: No deformity  Neurological examination  Mentation: Alert oriented to time, place, history taking. Follows most commands speech and language fluent  Cranial nerve II-XII: Pupils were equal round reactive to light extraocular movements were full, visual field were full on confrontational test. Facial sensation and strength were normal. hearing was intact to finger rubbing bilaterally. Uvula tongue midline. head turning and shoulder shrug were normal and symmetric.Tongue protrusion into cheek strength was normal.  Motor: normal bulk and tone, full strength in the BUE, BLE, fine finger movements normal, no pronator drift. No focal weakness  Coordination: finger-nose-finger, heel-to-shin bilaterally, no dysmetria  Reflexes: Brachioradialis 2/2, biceps 2/2, triceps 2/2, patellar 2/2, Achilles 2/2, plantar responses were flexor bilaterally.  Gait and Station: Rising up from seated position without assistance, narrow based, moderate stride, steady gait, no  assistive device  DIAGNOSTIC DATA (LABS, IMAGING, TESTING) - I reviewed patient records, labs, notes, testing and imaging myself where available.  Lab Results  Component Value Date   WBC 8.8 03/07/2015   HGB 14.5 03/07/2015   HCT 43.9 03/07/2015   MCV 87 03/07/2015   PLT 278 03/07/2015      Component Value Date/Time   NA 142 03/07/2015 0903   NA 138 10/03/2013 1547   K 4.3 03/07/2015 0903   CL 107 03/07/2015 0903   CO2 20 03/07/2015 0903   GLUCOSE 88 03/07/2015 0903   GLUCOSE 87 10/03/2013 1547   BUN 10 03/07/2015 0903   BUN 14 10/03/2013 1547   CREATININE 0.91 03/07/2015 0903   CALCIUM 9.2 03/07/2015 0903   PROT 7.1 03/07/2015 0903   PROT 7.3 03/27/2013 1524   ALBUMIN 4.1 03/07/2015 0903   ALBUMIN 3.5 03/27/2013 1524   AST 15 03/07/2015 0903   ALT 20 03/07/2015 0903   ALKPHOS 116 03/07/2015 0903   BILITOT <0.2 03/07/2015 0903   BILITOT <0.2 03/28/2014 0815  GFRNONAA 103 03/07/2015 0903   GFRAA 119 03/07/2015 0903    ASSESSMENT AND PLAN  44 y.o. year old male  has a past medical history of Seizures ([place]); Anxiety; and Depression. here to follow-up. Reviewed labs from last visit in April Continue Topamax 100+50 mg twice daily Continue Dilantin 100mg  2 tablets twice daily  Continue Keppra 500mg  3 tablets twice daily BRAND Continue Vimpat 150 mg twice daily Will refill SCAT form filled out.  Continue BuSpar and celexa for anxiety disorder and depression Stop smoking for overall health and well-being Followup in 6 months  Dennie Bible, [facility], [facility], APRN  [facility] [address] [place],  [address] [phone]

## 2015-09-08 NOTE — Progress Notes (Signed)
I have reviewed and agreed above plan. 

## 2015-09-17 DIAGNOSIS — F331 Major depressive disorder, recurrent, moderate: Secondary | ICD-10-CM | POA: Diagnosis not present

## 2016-03-08 ENCOUNTER — Ambulatory Visit (INDEPENDENT_AMBULATORY_CARE_PROVIDER_SITE_OTHER): Payer: Medicare Other | Admitting: Nurse Practitioner

## 2016-03-08 ENCOUNTER — Encounter: Payer: Self-pay | Admitting: Nurse Practitioner

## 2016-03-08 VITALS — BP 133/83 | HR 82 | Ht 69.5 in | Wt 212.6 lb

## 2016-03-08 DIAGNOSIS — R569 Unspecified convulsions: Secondary | ICD-10-CM

## 2016-03-08 DIAGNOSIS — Z5181 Encounter for therapeutic drug level monitoring: Secondary | ICD-10-CM | POA: Diagnosis not present

## 2016-03-08 DIAGNOSIS — F419 Anxiety disorder, unspecified: Secondary | ICD-10-CM

## 2016-03-08 MED ORDER — PHENYTOIN SODIUM EXTENDED 100 MG PO CAPS
200.0000 mg | ORAL_CAPSULE | Freq: Two times a day (BID) | ORAL | Status: DC
Start: 2016-03-08 — End: 2016-11-08

## 2016-03-08 MED ORDER — LACOSAMIDE 150 MG PO TABS: 1.0000 | ORAL_TABLET | Freq: Two times a day (BID) | ORAL | Status: DC

## 2016-03-08 MED ORDER — TOPIRAMATE 50 MG PO TABS: 50.0000 mg | ORAL_TABLET | Freq: Two times a day (BID) | ORAL | Status: DC

## 2016-03-08 MED ORDER — KEPPRA 500 MG PO TABS: ORAL_TABLET | ORAL | Status: DC

## 2016-03-08 MED ORDER — TOPIRAMATE 100 MG PO TABS: ORAL_TABLET | ORAL | Status: DC

## 2016-03-08 NOTE — Patient Instructions (Signed)
Continue current meds Refilled Keppra RX to patient  F/U in 8 months next with Dr. Krista Blue

## 2016-03-08 NOTE — Progress Notes (Signed)
[facility]  PATIENT: Andrew [name] DOB: 21-Apr-1971   REASON FOR VISIT: Follow-up for intractable seizure disorder, anxiety disorder HISTORY FROM: Patient and mother    HISTORY OF PRESENT ILLNESS:Andrew [name], 45 year old male returns for followup. He is with his motherHe has a history of intractable seizure disorder since 45 years old. He has been followed in the office since 1990. 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  He has had EMU monitoring at [facility] in the past that included abnormal baseline EEG due to intermittent predominantly right temporal spike, sharp right,and focal slowing. He continues to refuse surgery to remove irritable focus.  Over the years, he has tried different combination, he has been on current medications  Topamax 100 mg twice a day, +50 mg twice a day, Dilantin 100 mg 2 tablets twice a day, Keppra 500 mg 3 tablets b.i.d, brand name  He is also taking Buspirone 10 mg t.i.d., citalopram 20 mg every day for his depression and angry control prescribed by psychiatry.  He continues to have 2-3 seizures every day, complex partial with secondary generalization, he had burst open few helmet due to his seizure, he continued to smoke marijuana and cigarette. He helps his mother taking care of his elderly bed ridden grandmother at home, has raging spells sometimes.   UPDATE October 17 ,2016: Andrew [name], 45 year old male returns for follow-up. He has intractable seizure disorder, overall his seizures are in much better control adding Vimpat 150 mg twice a day, he has gingival hypertrophy, but mother is overall very happy about his seizure control, does not want to change his medications.  Topamax 100 mg twice a day, +50 mg twice a day, Dilantin 100 mg 2 tablets twice a day, Keppra 500 mg 3 tablets b.i.d, brand name name, Vimpat 150 twice a day he needs refills of his Vimpat only. He wears a helmet to prevent head injury during  seizure events. He returns for reevaluation. He has a SCAT form for transportation to be filled out. UPDATE 04/17/2017CM Andrew [name], 45 year old male returns for follow-up. He has intractable seizure disorder however his seizures are in fairly good control at present especially since Vimpat was added. He remains on Topamax 150 twice daily, Dilantin 200 mg twice daily, Keppra 500  Tablets(3) twice daily brand drug and Vimpat 150 twice daily. He wears a helmet at all times to prevent any head injury during seizure events. He continues to go to counseling, his Celexa has been discontinued. He remains on BuSpar. He returns for reevaluation   REVIEW OF SYSTEMS: Full 14 system review of systems performed and notable only for those listed, all others are neg:  Constitutional: neg  Cardiovascular: neg Ear/Nose/Throat: neg  Skin: neg Eyes: neg Respiratory: neg Gastroitestinal: neg  Hematology/Lymphatic: neg  Endocrine: neg Musculoskeletal:neg Allergy/Immunology: neg NeurologicalIntractable seizure disorder Psychiatric anxiety disorder  Sleep : neg   ALLERGIES: No Known Allergies  HOME MEDICATIONS: Outpatient Prescriptions Prior to Visit  Medication Sig Dispense Refill  . busPIRone (BUSPAR) 15 MG tablet Take 15 mg by mouth 2 (two) times daily.     Marland Kitchen KEPPRA 500 MG tablet take 3 tablets by mouth twice a day Brand Medically Necessary 540 tablet 3  . Lacosamide (VIMPAT) 150 MG TABS Take 1 tablet (150 mg total) by mouth 2 (two) times daily. 60 tablet 5  . phenytoin (DILANTIN) 100 MG ER capsule Take 2 capsules (200 mg total) by mouth 2 (two) times daily. 360 capsule 3  . topiramate (TOPAMAX) 100  MG tablet take 1 tablet by mouth twice a day with 50mg  to total 150mg  bid 180 tablet 3  . topiramate (TOPAMAX) 50 MG tablet Take 1 tablet (50 mg total) by mouth 2 (two) times daily. 180 tablet 3  . citalopram (CELEXA) 40 MG tablet Take 40 mg by mouth daily. Reported on 03/08/2016     No  facility-administered medications prior to visit.    PAST MEDICAL HISTORY: Past Medical History  Diagnosis Date  . Seizures ([place])   . Anxiety   . Depression     PAST SURGICAL HISTORY: Past Surgical History  Procedure Laterality Date  . None      FAMILY HISTORY: Family History  Problem Relation Age of Onset  . Asthma Father     SOCIAL HISTORY: Social History   Social History  . Marital Status: Single    Spouse Name: N/A  . Number of Children: 0  . Years of Education: 12   Occupational History  .      Disabled   Social History Main Topics  . Smoking status: Current Every Day Smoker -- 0.50 packs/day for 20 years    Types: Cigarettes  . Smokeless tobacco: Never Used  . Alcohol Use: No  . Drug Use: Yes    Special: Marijuana     Comment: Generally, two weeks out of every month or until his monthly supply has run out.  Marland Kitchen Sexual Activity: Not on file   Other Topics Concern  . Not on file   Social History Narrative   Patient lives at home with his mother Andrew [name] ). Patient is disabled. Patient has high school education.   Left handed.   Caffeine- soda - pepsi four cans daily.   Patients father died of over dose.-40     PHYSICAL EXAM  Filed Vitals:   03/08/16 1250  BP: 133/83  Pulse: 82  Height: 5' 9.5" (1.765 m)  Weight: 212 lb 9.6 oz (96.435 kg)   Body mass index is 30.96 kg/(m^2). Generalized: Well developed, in no acute distress  Head: normocephalic and atraumatic,. gingival hypertrophy, wearing helmet  Neck: Supple, no carotid bruits  Musculoskeletal: No deformity  Neurological examination  Mentation: Alert oriented to time, place, history taking. Follows most commands speech and language fluent  Cranial nerve II-XII: Pupils were equal round reactive to light extraocular movements were full, visual field were full on confrontational test. Facial sensation and strength were normal. hearing was intact to finger rubbing bilaterally. Uvula  tongue midline. head turning and shoulder shrug were normal and symmetric.Tongue protrusion into cheek strength was normal.  Motor: normal bulk and tone, full strength in the BUE, BLE, fine finger movements normal, no pronator drift. No focal weakness  Coordination: finger-nose-finger, heel-to-shin bilaterally, no dysmetria  Reflexes: Brachioradialis 2/2, biceps 2/2, triceps 2/2, patellar 2/2, Achilles 2/2, plantar responses were flexor bilaterally.  Gait and Station: Rising up from seated position without assistance, narrow based, moderate stride, steady gait, no assistive device   DIAGNOSTIC DATA (LABS, IMAGING, TESTING) -   ASSESSMENT AND PLAN 45 y.o. year old male  has a past medical history of  intractable Seizures ([place]); Anxiety; and Depression. here to follow-up.   Continue Topamax 100+50 mg twice daily refilled Continue Dilantin 100mg  2 tablets twice daily ,refilled Continue Keppra 500mg  3 tablets twice daily BRAND RX to Mom per request Continue Vimpat 150 mg twice daily Will refill Continue BuSpar for anxiety disorder and depression Continue same counselor for anxiety disorder and depression Stop smoking  for overall health and well-being Followup in 8 months next with Dr. Luan Pulling, [facility], [facility], APRN  [facility] [address] [place], [place] [address] [phone]

## 2016-03-08 NOTE — Progress Notes (Signed)
I have reviewed and agreed above plan. 

## 2016-03-09 ENCOUNTER — Telehealth: Payer: Self-pay | Admitting: Nurse Practitioner

## 2016-03-09 LAB — COMPREHENSIVE METABOLIC PANEL
ALT: 14 IU/L (ref 0–44)
AST: 13 IU/L (ref 0–40)
Albumin/Globulin Ratio: 1.2 (ref 1.2–2.2)
Albumin: 4 g/dL (ref 3.5–5.5)
Alkaline Phosphatase: 112 IU/L (ref 39–117)
BUN/Creatinine Ratio: 9 (ref 9–20)
BUN: 9 mg/dL (ref 6–24)
Bilirubin Total: <0.2 mg/dL (ref 0.0–1.2)
CO2: 17 mmol/L — ABNORMAL LOW (ref 18–29)
Calcium: 9.3 mg/dL (ref 8.7–10.2)
Chloride: 108 mmol/L — ABNORMAL HIGH (ref 96–106)
Creatinine, Ser: 1 mg/dL (ref 0.76–1.27)
GFR calc Af Amer: 105 mL/min/{1.73_m2} (ref >59)
GFR calc non Af Amer: 91 mL/min/{1.73_m2} (ref >59)
Globulin, Total: 3.4 g/dL (ref 1.5–4.5)
Glucose: 94 mg/dL (ref 65–99)
Potassium: 3.8 mmol/L (ref 3.5–5.2)
Sodium: 143 mmol/L (ref 134–144)
Total Protein: 7.4 g/dL (ref 6.0–8.5)

## 2016-03-09 LAB — CBC WITH DIFFERENTIAL/PLATELET
Basophils Absolute: 0 10*3/uL (ref 0.0–0.2)
Basos: 0 %
EOS (ABSOLUTE): 0.3 10*3/uL (ref 0.0–0.4)
Eos: 3 %
Hematocrit: 42.6 % (ref 37.5–51.0)
Hemoglobin: 13.9 g/dL (ref 12.6–17.7)
Immature Grans (Abs): 0 10*3/uL (ref 0.0–0.1)
Immature Granulocytes: 0 %
Lymphocytes Absolute: 3.2 10*3/uL — ABNORMAL HIGH (ref 0.7–3.1)
Lymphs: 31 %
MCH: 28.3 pg (ref 26.6–33.0)
MCHC: 32.6 g/dL (ref 31.5–35.7)
MCV: 87 fL (ref 79–97)
Monocytes Absolute: 0.9 10*3/uL (ref 0.1–0.9)
Monocytes: 8 %
Neutrophils Absolute: 6 10*3/uL (ref 1.4–7.0)
Neutrophils: 58 %
Platelets: 303 10*3/uL (ref 150–379)
RBC: 4.91 x10E6/uL (ref 4.14–5.80)
RDW: 14.3 % (ref 12.3–15.4)
WBC: 10.3 10*3/uL (ref 3.4–10.8)

## 2016-03-09 LAB — PHENYTOIN LEVEL, TOTAL: Phenytoin (Dilantin), Serum: 9.6 ug/mL — ABNORMAL LOW (ref 10.0–20.0)

## 2016-03-09 NOTE — Telephone Encounter (Signed)
Patient's mother called back. Please call [phone]

## 2016-03-09 NOTE — Progress Notes (Signed)
Quick Note:  Called and spoke to patient's mother and relayed labs are ok and RX's all renewed. Patient 's mother understood. ______

## 2016-03-09 NOTE — Telephone Encounter (Signed)
Patient's mother called back and I relayed to her labs were ok and RX's renewed. Patient's mother understood.

## 2016-03-09 NOTE — Telephone Encounter (Signed)
-----   Message from Dennie Bible, NP sent at 03/09/2016  8:19 AM EDT ----- Labs ok please call the mom, meds were all renewed.

## 2016-03-09 NOTE — Telephone Encounter (Signed)
Called patient's mother to speak to her she was not home. I will call her back.

## 2016-04-08 DIAGNOSIS — F331 Major depressive disorder, recurrent, moderate: Secondary | ICD-10-CM | POA: Diagnosis not present

## 2016-04-14 DIAGNOSIS — F331 Major depressive disorder, recurrent, moderate: Secondary | ICD-10-CM | POA: Diagnosis not present

## 2016-05-04 ENCOUNTER — Emergency Department (HOSPITAL_COMMUNITY)
Admission: EM | Admit: 2016-05-04 | Discharge: 2016-05-04 | Disposition: A | Discharge status: Home / Self Care | Payer: Medicare Other | Attending: Emergency Medicine | Admitting: Emergency Medicine

## 2016-05-04 ENCOUNTER — Encounter (HOSPITAL_COMMUNITY): Payer: Self-pay | Admitting: Nurse Practitioner

## 2016-05-04 DIAGNOSIS — G40909 Epilepsy, unspecified, not intractable, without status epilepticus: Secondary | ICD-10-CM | POA: Insufficient documentation

## 2016-05-04 DIAGNOSIS — F329 Major depressive disorder, single episode, unspecified: Secondary | ICD-10-CM | POA: Insufficient documentation

## 2016-05-04 DIAGNOSIS — W1839XA Other fall on same level, initial encounter: Secondary | ICD-10-CM | POA: Diagnosis not present

## 2016-05-04 DIAGNOSIS — S0010XA Contusion of unspecified eyelid and periocular area, initial encounter: Secondary | ICD-10-CM | POA: Diagnosis not present

## 2016-05-04 DIAGNOSIS — Y92009 Unspecified place in unspecified non-institutional (private) residence as the place of occurrence of the external cause: Secondary | ICD-10-CM | POA: Insufficient documentation

## 2016-05-04 DIAGNOSIS — Y939 Activity, unspecified: Secondary | ICD-10-CM | POA: Insufficient documentation

## 2016-05-04 DIAGNOSIS — Z79899 Other long term (current) drug therapy: Secondary | ICD-10-CM | POA: Insufficient documentation

## 2016-05-04 DIAGNOSIS — S0990XA Unspecified injury of head, initial encounter: Secondary | ICD-10-CM | POA: Diagnosis not present

## 2016-05-04 DIAGNOSIS — F17210 Nicotine dependence, cigarettes, uncomplicated: Secondary | ICD-10-CM | POA: Insufficient documentation

## 2016-05-04 DIAGNOSIS — S01111A Laceration without foreign body of right eyelid and periocular area, initial encounter: Secondary | ICD-10-CM | POA: Insufficient documentation

## 2016-05-04 DIAGNOSIS — S0181XA Laceration without foreign body of other part of head, initial encounter: Secondary | ICD-10-CM

## 2016-05-04 DIAGNOSIS — Y999 Unspecified external cause status: Secondary | ICD-10-CM | POA: Diagnosis not present

## 2016-05-04 MED ORDER — LIDOCAINE-EPINEPHRINE 1 %-1:100000 IJ SOLN: 10.0000 mL | Freq: Once | INTRAMUSCULAR | Status: DC

## 2016-05-04 MED ORDER — LIDOCAINE-EPINEPHRINE (PF) 1 %-1:200000 IJ SOLN
INTRAMUSCULAR | Status: AC
  Administered 2016-05-04: 30 mL
  Filled 2016-05-04: qty 30

## 2016-05-04 NOTE — Discharge Instructions (Signed)
Facial Laceration A facial laceration is a cut on the face. These injuries can be painful and cause bleeding. Some cuts may need to be closed with stitches (sutures), skin adhesive strips, or wound glue. Cuts usually heal quickly but can leave a scar. It can take 1-2 years for the scar to go away completely. HOME CARE   Only take medicines as told by your doctor.  Follow your doctor's instructions for wound care. For Stitches:  Keep the cut clean and dry.  If you have a bandage (dressing), change it at least once a day. Change the bandage if it gets wet or dirty, or as told by your doctor.  Wash the cut with soap and water 2 times a day. Rinse the cut with water. Pat it dry with a clean towel.  Put a thin layer of medicated cream on the cut as told by your doctor.  You may shower after the first 24 hours. Do not soak the cut in water until the stitches are removed.  Have your stitches removed as told by your doctor.  Do not wear any makeup until a few days after your stitches are removed. For Skin Adhesive Strips:  Keep the cut clean and dry.  Do not get the strips wet. You may take a bath, but be careful to keep the cut dry.  If the cut gets wet, pat it dry with a clean towel.  The strips will fall off on their own. Do not remove the strips that are still stuck to the cut. For Wound Glue:  You may shower or take baths. Do not soak or scrub the cut. Do not swim. Avoid heavy sweating until the glue falls off on its own. After a shower or bath, pat the cut dry with a clean towel.  Do not put medicine or makeup on your cut until the glue falls off.  If you have a bandage, do not put tape over the glue.  Avoid lots of sunlight or tanning lamps until the glue falls off.  The glue will fall off on its own in 5-10 days. Do not pick at the glue. After Healing:  Put sunscreen on the cut for the first year to reduce your scar. GET HELP IF:  You have a fever. GET HELP RIGHT AWAY  IF:   Your cut area gets red, painful, or puffy (swollen).  You see a yellowish-white fluid (pus) coming from the cut.   This information is not intended to replace advice given to you by your health care provider. Make sure you discuss any questions you have with your health care provider.    Epilepsy Epilepsy is a disorder in which a person has repeated seizures over time. A seizure is a release of abnormal electrical activity in the brain. Seizures can cause a change in attention, behavior, or the ability to remain awake and alert (altered mental status). Seizures often involve uncontrollable shaking (convulsions).  Most people with epilepsy lead normal lives. However, people with epilepsy are at an increased risk of falls, accidents, and injuries. Therefore, it is important to begin treatment right away. CAUSES  Epilepsy has many possible causes. Anything that disturbs the normal pattern of brain cell activity can lead to seizures. This may include:   Head injury.  Birth trauma.  High fever as a child.  Stroke.  Bleeding into or around the brain.  Certain drugs.  Prolonged low oxygen, such as what occurs after CPR efforts.  Abnormal brain development.  Certain illnesses, such as meningitis, encephalitis (brain infection), malaria, and other infections.  An imbalance of nerve signaling chemicals (neurotransmitters).  SIGNS AND SYMPTOMS  The symptoms of a seizure can vary greatly from one person to another. Right before a seizure, you may have a warning (aura) that a seizure is about to occur. An aura may include the following symptoms:  Fear or anxiety.  Nausea.  Feeling like the room is spinning (vertigo).  Vision changes, such as seeing flashing lights or spots. Common symptoms during a seizure include:  Abnormal sensations, such as an abnormal smell or a bitter taste in the mouth.   Sudden, general body stiffness.   Convulsions that involve rhythmic jerking  of the face, arm, or leg on one or both sides.   Sudden change in consciousness.   Appearing to be awake but not responding.   Appearing to be asleep but cannot be awakened.   Grimacing, chewing, lip smacking, drooling, tongue biting, or loss of bowel or bladder control. After a seizure, you may feel sleepy for a while. DIAGNOSIS  Your health care provider will ask about your symptoms and take a medical history. Descriptions from any witnesses to your seizures will be very helpful in the diagnosis. A physical exam, including a detailed neurological exam, is necessary. Various tests may be done, such as:   An electroencephalogram (EEG). This is a painless test of your brain waves. In this test, a diagram is created of your brain waves. These diagrams can be interpreted by a specialist.  An MRI of the brain.   A CT scan of the brain.   A spinal tap (lumbar puncture, LP).  Blood tests to check for signs of infection or abnormal blood chemistry. TREATMENT  There is no cure for epilepsy, but it is generally treatable. Once epilepsy is diagnosed, it is important to begin treatment as soon as possible. For most people with epilepsy, seizures can be controlled with medicines. The following may also be used:  A pacemaker for the brain (vagus nerve stimulator) can be used for people with seizures that are not well controlled by medicine.  Surgery on the brain. For some people, epilepsy eventually goes away. HOME CARE INSTRUCTIONS   Follow your health care provider's recommendations on driving and safety in normal activities.  Get enough rest. Lack of sleep can cause seizures.  Only take over-the-counter or prescription medicines as directed by your health care provider. Take any prescribed medicine exactly as directed.  Avoid any known triggers of your seizures.  Keep a seizure diary. Record what you recall about any seizure, especially any possible trigger.   Make sure the  people you live and work with know that you are prone to seizures. They should receive instructions on how to help you. In general, a witness to a seizure should:   Cushion your head and body.   Turn you on your side.   Avoid unnecessarily restraining you.   Not place anything inside your mouth.   Call for emergency medical help if there is any question about what has occurred.   Follow up with your health care provider as directed. You may need regular blood tests to monitor the levels of your medicine.  SEEK MEDICAL CARE IF:   You develop signs of infection or other illness. This might increase the risk of a seizure.   You seem to be having more frequent seizures.   Your seizure pattern is changing.  SEEK IMMEDIATE MEDICAL CARE IF:  You have a seizure that does not stop after a few moments.   You have a seizure that causes any difficulty in breathing.   You have a seizure that results in a very severe headache.   You have a seizure that leaves you with the inability to speak or use a part of your body.    This information is not intended to replace advice given to you by your health care provider. Make sure you discuss any questions you have with your health care provider.   Document Released: 11/08/2005 Document Revised: 08/29/2013 Document Reviewed: 06/20/2013 Elsevier Interactive Patient Education [employer].

## 2016-05-04 NOTE — ED Provider Notes (Signed)
CSN: [id number]     Arrival date & time 05/04/16  1455 History   First MD Initiated Contact with Patient 05/04/16 1532     Chief Complaint  Patient presents with  . Seizures  . Head Injury  . Head Laceration    right eye     (Consider location/radiation/quality/duration/timing/severity/associated sxs/prior Treatment) HPI Patient has history of epilepsy. His mother reports that he has frequent seizures. She states it was not unusual for him to have a seizure today. He however struck his head on the edge of a dresser and got a significant cut on his face. She reports his behavior has been normal. The main concern was that he had a laceration that needed repair. Patient denies any complaints. He is denying pain except for his cut is. His mother reports he's been at normal baseline function for diet and state of health. Past Medical History  Diagnosis Date  . Seizures ([place])   . Anxiety   . Depression    Past Surgical History  Procedure Laterality Date  . None     Family History  Problem Relation Age of Onset  . Asthma Father    Social History  Substance Use Topics  . Smoking status: Current Every Day Smoker -- 0.50 packs/day for 20 years    Types: Cigarettes  . Smokeless tobacco: Never Used  . Alcohol Use: No    Review of Systems 10 Systems reviewed and are negative for acute change except as noted in the HPI.    Allergies  Review of patient's allergies indicates no known allergies.  Home Medications   Prior to Admission medications   Medication Sig Start Date End Date Taking? Authorizing Provider  busPIRone (BUSPAR) 15 MG tablet Take 15 mg by mouth 3 (three) times daily.    Yes Historical Provider, MD  Cariprazine HCl (VRAYLAR) 1.5 MG CAPS Take 1.5 mg by mouth daily.   Yes Historical Provider, MD  KEPPRA 500 MG tablet take 3 tablets by mouth twice a day Brand Medically Necessary 03/08/16  Yes Dennie Bible, NP  Lacosamide (VIMPAT) 150 MG TABS Take 1 tablet (150  mg total) by mouth 2 (two) times daily. 03/08/16  Yes Dennie Bible, NP  phenytoin (DILANTIN) 100 MG ER capsule Take 2 capsules (200 mg total) by mouth 2 (two) times daily. 03/08/16  Yes Dennie Bible, NP  topiramate (TOPAMAX) 100 MG tablet take 1 tablet by mouth twice a day with 50mg  to total 150mg  bid 03/08/16  Yes Dennie Bible, NP  topiramate (TOPAMAX) 50 MG tablet Take 1 tablet (50 mg total) by mouth 2 (two) times daily. Patient taking differently: Take 50 mg by mouth 2 (two) times daily. Takes along with the 100mg  to make a total of 150mg  twice per day 03/08/16  Yes Dennie Bible, NP   BP 159/103 mmHg  Pulse 85  Temp(Src) 99.1 F (37.3 C) (Oral)  Resp 18  SpO2 97% Physical Exam  Constitutional: He is oriented to person, place, and time. He appears well-developed and well-nourished.  HENT:  Head: Normocephalic and atraumatic.  Patient has a 4 cm laceration that is vertically oriented in the one third medial aspect of the brow towards the eyelid. This has significant gaping. No active bleeding at this time. Linear and depth of approximately 1 cm at maximum depth  Eyes: EOM are normal. Pupils are equal, round, and reactive to light.  Neck: Neck supple.  Cardiovascular: Normal rate, regular rhythm, normal heart sounds and  intact distal pulses.   Pulmonary/Chest: Effort normal and breath sounds normal.  Abdominal: Soft. Bowel sounds are normal. He exhibits no distension. There is no tenderness.  Musculoskeletal: Normal range of motion. He exhibits no edema.  Neurological: He is alert and oriented to person, place, and time. He has normal strength. No cranial nerve deficit. He exhibits normal muscle tone. Coordination normal. GCS eye subscore is 4. GCS verbal subscore is 5. GCS motor subscore is 6.  Skin: Skin is warm, dry and intact.  Psychiatric: He has a normal mood and affect.    ED Course  .Marland KitchenLaceration Repair Date/Time: 05/04/2016 6:33 PM Performed by:  Charlesetta Shanks Authorized by: Charlesetta Shanks Body area: head/neck Location details: right eyebrow Laceration length: 4 cm Foreign bodies: no foreign bodies Tendon involvement: none Nerve involvement: none Vascular damage: no Anesthesia: local infiltration Local anesthetic: lidocaine 1% with epinephrine Anesthetic total: 3 ml Patient sedated: no Irrigation solution: saline Amount of cleaning: standard Debridement: none Degree of undermining: none Skin closure: 5-0 nylon and Ethilon Number of sutures: 5 Technique: simple Approximation: close Approximation difficulty: complex Dressing: antibiotic ointment Comments: Wound linear but deep with gaping from above the brow to the upper portion of the eyelid. Closed with excellent approximation.   (including critical care time) Labs Review Labs Reviewed - No data to display  Imaging Review No results found. I have personally reviewed and evaluated these images and lab results as part of my medical decision-making.   EKG Interpretation None      MDM   Final diagnoses:  Seizure disorder (HCC)  Facial laceration, initial encounter   Patient presents with known history of frequent seizures. No change in baseline function or evidence of acute precipitants of seizure other than baseline frequency. Laceration repaired as outlined. Patient is alert and appropriately interactive. Instructions given for laceration care and return instructions.    Charlesetta Shanks, MD 05/04/16 [phone]

## 2016-05-04 NOTE — ED Notes (Signed)
Bed: [id number] Expected date:  Expected time:  Means of arrival:  Comments: EMS- 45yo M, seizure/head lac

## 2016-05-04 NOTE — ED Notes (Signed)
Patient presents to [facility] after suffering from a seizure. He was found by his mother who is primary care giver sitting on the edge of the bed holding his head in his hand and covered in blood. There was evidence that he had fallen from standing, struck a wall hard enough to damage the sheet rock in the home, struck his head on a dresser, and suffered a head laceration to his right eye. He also had an absence seizure in the ambulance that lasted approximately 2-3 minutes. No medications were administered, mother states that his medication regimen is up to date and he has received all medications. Mother accompanies him today. Patient is independent, alert, and oriented at baseline and has 2-3 seizures per day. Wears helmet for protection at baseline.

## 2016-05-22 ENCOUNTER — Emergency Department (HOSPITAL_COMMUNITY)
Admission: EM | Admit: 2016-05-22 | Discharge: 2016-05-22 | Disposition: A | Discharge status: Home / Self Care | Payer: Medicare Other | Attending: Emergency Medicine | Admitting: Emergency Medicine

## 2016-05-22 ENCOUNTER — Encounter (HOSPITAL_COMMUNITY): Payer: Self-pay

## 2016-05-22 DIAGNOSIS — F17210 Nicotine dependence, cigarettes, uncomplicated: Secondary | ICD-10-CM | POA: Insufficient documentation

## 2016-05-22 DIAGNOSIS — Z4802 Encounter for removal of sutures: Secondary | ICD-10-CM | POA: Insufficient documentation

## 2016-05-22 DIAGNOSIS — F329 Major depressive disorder, single episode, unspecified: Secondary | ICD-10-CM | POA: Insufficient documentation

## 2016-05-22 DIAGNOSIS — Z79899 Other long term (current) drug therapy: Secondary | ICD-10-CM | POA: Insufficient documentation

## 2016-05-22 NOTE — ED Provider Notes (Signed)
CSN: [id number]     Arrival date & time 05/22/16  1001 History   First MD Initiated Contact with Patient 05/22/16 1017     Chief Complaint  Patient presents with  . Suture / Staple Removal     (Consider location/radiation/quality/duration/timing/severity/associated sxs/prior Treatment) The history is provided by the patient and a parent.  WEIR CROFF is a 45 y.o. male history of seizures on multiple seizure medicines, recent eyebrow laceration here presenting with suture removal. Patient states that he was seen here about 2 weeks ago for seizure and had a laceration when he fell. He actually had laceration repaired with 5 stitches and here with suture removal. Patient has 2-3 seizures daily and that is baseline for him. Mother states that she gives him his medicines as prescribed by his neurologist. She has been applying some bacitracin and clean the wound very well and denies any purulent drainage from the wound or any fevers. Here for suture removal     Past Medical History  Diagnosis Date  . Seizures ([place])   . Anxiety   . Depression    Past Surgical History  Procedure Laterality Date  . None     Family History  Problem Relation Age of Onset  . Asthma Father    Social History  Substance Use Topics  . Smoking status: Current Every Day Smoker -- 0.50 packs/day for 20 years    Types: Cigarettes  . Smokeless tobacco: Never Used  . Alcohol Use: No    Review of Systems  Skin: Positive for wound.  All other systems reviewed and are negative.     Allergies  Review of patient's allergies indicates no known allergies.  Home Medications   Prior to Admission medications   Medication Sig Start Date End Date Taking? Authorizing Provider  busPIRone (BUSPAR) 15 MG tablet Take 15 mg by mouth 3 (three) times daily.     Historical Provider, MD  Cariprazine HCl (VRAYLAR) 1.5 MG CAPS Take 1.5 mg by mouth daily.    Historical Provider, MD  KEPPRA 500 MG tablet take 3 tablets by  mouth twice a day Brand Medically Necessary 03/08/16   Dennie Bible, NP  Lacosamide (VIMPAT) 150 MG TABS Take 1 tablet (150 mg total) by mouth 2 (two) times daily. 03/08/16   Dennie Bible, NP  phenytoin (DILANTIN) 100 MG ER capsule Take 2 capsules (200 mg total) by mouth 2 (two) times daily. 03/08/16   Dennie Bible, NP  topiramate (TOPAMAX) 100 MG tablet take 1 tablet by mouth twice a day with 50mg  to total 150mg  bid 03/08/16   Dennie Bible, NP  topiramate (TOPAMAX) 50 MG tablet Take 1 tablet (50 mg total) by mouth 2 (two) times daily. Patient taking differently: Take 50 mg by mouth 2 (two) times daily. Takes along with the 100mg  to make a total of 150mg  twice per day 03/08/16   Dennie Bible, NP   BP 127/83 mmHg  Pulse 66  Temp(Src) 98.5 F (36.9 C) (Oral)  Resp 18  SpO2 97% Physical Exam  Constitutional: He is oriented to person, place, and time. He appears well-developed and well-nourished.  HENT:  Head: Normocephalic.  Mouth/Throat: Oropharynx is clear and moist.  Eyes: Conjunctivae and EOM are normal. Pupils are equal, round, and reactive to light.  Linear vertical laceration R eyebrow with 5 sutures, no obvious purulent drainage or redness, extra ocular movements intact   Neck: Normal range of motion. Neck supple.  Cardiovascular: Normal rate,  regular rhythm and normal heart sounds.   Pulmonary/Chest: Effort normal and breath sounds normal.  Abdominal: Soft. Bowel sounds are normal. He exhibits no distension. There is no tenderness. There is no rebound.  Musculoskeletal: Normal range of motion.  Neurological: He is alert and oriented to person, place, and time.  Skin: Skin is warm and dry.  Psychiatric: He has a normal mood and affect. His behavior is normal. Judgment and thought content normal.  Nursing note and vitals reviewed.   ED Course  Procedures (including critical care time)   SUTURE REMOVAL Performed by: Darl Householder, Ashtian Villacis  Consent:  Verbal consent obtained. Patient identity confirmed: provided demographic data Time out: Immediately prior to procedure a "time out" was called to verify the correct patient, procedure, equipment, support staff and site/side marked as required.  Location details: R eyebrow  Wound Appearance: clean  Sutures/Staples Removed: 5   Facility: sutures placed in this facility Patient tolerance: Patient tolerated the procedure well with no immediate complications.    Labs Review Labs Reviewed - No data to display  Imaging Review No results found. I have personally reviewed and evaluated these images and lab results as part of my medical decision-making.   EKG Interpretation None      MDM   Final diagnoses:  None    FORDYCE ELM is a 45 y.o. male here with suture removal. Wound is closed well, no obvious infection. Vitals stable. Seizure is baseline for him and he has nl neuro exam currently. Sutures removed. Will dc home with mother     Wandra Arthurs, MD 05/22/16 1025

## 2016-05-22 NOTE — Discharge Instructions (Signed)
Continue taking your seizure meds.   Keep wound clean and dry. It is healing well.   See your doctor.   Return to ER if you have fever, purulent drainage from the wound, uncontrolled seizures

## 2016-05-22 NOTE — ED Notes (Signed)
Pt presents for suture removal.  Pt had sutures placed above R eye on 6/13.  Denies pain.

## 2016-08-12 ENCOUNTER — Emergency Department (HOSPITAL_COMMUNITY)
Admission: EM | Admit: 2016-08-12 | Discharge: 2016-08-12 | Disposition: A | Discharge status: Home / Self Care | Payer: Medicare Other | Attending: Emergency Medicine | Admitting: Emergency Medicine

## 2016-08-12 ENCOUNTER — Encounter (HOSPITAL_COMMUNITY): Payer: Self-pay | Admitting: Mental Health

## 2016-08-12 DIAGNOSIS — Z79899 Other long term (current) drug therapy: Secondary | ICD-10-CM | POA: Diagnosis not present

## 2016-08-12 DIAGNOSIS — F17210 Nicotine dependence, cigarettes, uncomplicated: Secondary | ICD-10-CM | POA: Insufficient documentation

## 2016-08-12 DIAGNOSIS — F1290 Cannabis use, unspecified, uncomplicated: Secondary | ICD-10-CM | POA: Diagnosis not present

## 2016-08-12 DIAGNOSIS — Z046 Encounter for general psychiatric examination, requested by authority: Secondary | ICD-10-CM | POA: Diagnosis present

## 2016-08-12 DIAGNOSIS — F918 Other conduct disorders: Secondary | ICD-10-CM | POA: Insufficient documentation

## 2016-08-12 DIAGNOSIS — F332 Major depressive disorder, recurrent severe without psychotic features: Secondary | ICD-10-CM | POA: Diagnosis not present

## 2016-08-12 DIAGNOSIS — R4689 Other symptoms and signs involving appearance and behavior: Secondary | ICD-10-CM

## 2016-08-12 LAB — CBC WITH DIFFERENTIAL/PLATELET
BASOS ABS: 0.1 10*3/uL (ref 0.0–0.1)
Basophils Relative: 1 %
EOS PCT: 3 %
Eosinophils Absolute: 0.3 10*3/uL (ref 0.0–0.7)
HCT: 42.7 % (ref 39.0–52.0)
HEMOGLOBIN: 14.5 g/dL (ref 13.0–17.0)
LYMPHS ABS: 3.1 10*3/uL (ref 0.7–4.0)
LYMPHS PCT: 29 %
MCH: 29.1 pg (ref 26.0–34.0)
MCHC: 34 g/dL (ref 30.0–36.0)
MCV: 85.6 fL (ref 78.0–100.0)
Monocytes Absolute: 0.6 10*3/uL (ref 0.1–1.0)
Monocytes Relative: 5 %
NEUTROS ABS: 6.8 10*3/uL (ref 1.7–7.7)
NEUTROS PCT: 62 %
PLATELETS: 261 10*3/uL (ref 150–400)
RBC: 4.99 MIL/uL (ref 4.22–5.81)
RDW: 14.2 % (ref 11.5–15.5)
WBC: 10.9 10*3/uL — AB (ref 4.0–10.5)

## 2016-08-12 LAB — COMPREHENSIVE METABOLIC PANEL
ALBUMIN: 4.1 g/dL (ref 3.5–5.0)
ALK PHOS: 101 U/L (ref 38–126)
ALT: 22 U/L (ref 17–63)
ANION GAP: 7 (ref 5–15)
AST: 18 U/L (ref 15–41)
BILIRUBIN TOTAL: 0.3 mg/dL (ref 0.3–1.2)
BUN: 14 mg/dL (ref 6–20)
CHLORIDE: 112 mmol/L — AB (ref 101–111)
CO2: 21 mmol/L — AB (ref 22–32)
CREATININE: 1.14 mg/dL (ref 0.61–1.24)
Calcium: 9.4 mg/dL (ref 8.9–10.3)
Glucose, Bld: 135 mg/dL — ABNORMAL HIGH (ref 65–99)
POTASSIUM: 3.4 mmol/L — AB (ref 3.5–5.1)
SODIUM: 140 mmol/L (ref 135–145)
Total Protein: 8.1 g/dL (ref 6.5–8.1)

## 2016-08-12 LAB — ETHANOL

## 2016-08-12 MED ORDER — BUSPIRONE HCL 10 MG PO TABS: 15.0000 mg | ORAL_TABLET | Freq: Three times a day (TID) | ORAL | Status: DC

## 2016-08-12 MED ORDER — LEVETIRACETAM 500 MG PO TABS: 1500.0000 mg | ORAL_TABLET | Freq: Two times a day (BID) | ORAL | Status: DC

## 2016-08-12 MED ORDER — TOPIRAMATE 25 MG PO TABS: 50.0000 mg | ORAL_TABLET | Freq: Two times a day (BID) | ORAL | Status: DC

## 2016-08-12 MED ORDER — CARIPRAZINE HCL 1.5 MG PO CAPS
1.5000 mg | ORAL_CAPSULE | Freq: Every day | ORAL | Status: DC
  Filled 2016-08-12: qty 1

## 2016-08-12 MED ORDER — LACOSAMIDE 150 MG PO TABS
1.0000 | ORAL_TABLET | Freq: Two times a day (BID) | ORAL | Status: DC
Start: 2016-08-12 — End: 2016-08-12

## 2016-08-12 MED ORDER — LACOSAMIDE 50 MG PO TABS: 150.0000 mg | ORAL_TABLET | Freq: Two times a day (BID) | ORAL | Status: DC

## 2016-08-12 MED ORDER — TOPIRAMATE 100 MG PO TABS: 100.0000 mg | ORAL_TABLET | Freq: Two times a day (BID) | ORAL | Status: DC

## 2016-08-12 MED ORDER — PHENYTOIN SODIUM EXTENDED 100 MG PO CAPS: 200.0000 mg | ORAL_CAPSULE | Freq: Two times a day (BID) | ORAL | Status: DC

## 2016-08-12 NOTE — ED Notes (Signed)
Bed: WTR8 Expected date:  Expected time:  Means of arrival:  Comments: 
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
## 2016-08-12 NOTE — ED Notes (Signed)
Delay in lab draw, pt with TTS

## 2016-08-30 ENCOUNTER — Other Ambulatory Visit: Payer: Self-pay | Admitting: Nurse Practitioner

## 2016-08-30 NOTE — Telephone Encounter (Signed)
Has appt 11-08-16 with Dr. Krista Blue.

## 2016-08-31 NOTE — Telephone Encounter (Signed)
Attempted 3 times to fax to [phone].(failed).  [employer], fax working.  I gave verbal to Juliann Pulse, pharmacist for vimpat 150mg  po bid #60 with 5 refills.

## 2016-10-01 ENCOUNTER — Encounter (HOSPITAL_COMMUNITY): Payer: Self-pay

## 2016-10-01 ENCOUNTER — Emergency Department (HOSPITAL_COMMUNITY): Payer: Medicare Other

## 2016-10-01 ENCOUNTER — Emergency Department (HOSPITAL_COMMUNITY)
Admission: EM | Admit: 2016-10-01 | Discharge: 2016-10-01 | Disposition: A | Discharge status: Home / Self Care | Payer: Medicare Other | Attending: Emergency Medicine | Admitting: Emergency Medicine

## 2016-10-01 DIAGNOSIS — Y999 Unspecified external cause status: Secondary | ICD-10-CM | POA: Insufficient documentation

## 2016-10-01 DIAGNOSIS — S0990XA Unspecified injury of head, initial encounter: Secondary | ICD-10-CM

## 2016-10-01 DIAGNOSIS — M25511 Pain in right shoulder: Secondary | ICD-10-CM | POA: Diagnosis not present

## 2016-10-01 DIAGNOSIS — W228XXA Striking against or struck by other objects, initial encounter: Secondary | ICD-10-CM | POA: Diagnosis not present

## 2016-10-01 DIAGNOSIS — R569 Unspecified convulsions: Secondary | ICD-10-CM | POA: Diagnosis not present

## 2016-10-01 DIAGNOSIS — F17210 Nicotine dependence, cigarettes, uncomplicated: Secondary | ICD-10-CM | POA: Diagnosis not present

## 2016-10-01 DIAGNOSIS — Y939 Activity, unspecified: Secondary | ICD-10-CM | POA: Insufficient documentation

## 2016-10-01 DIAGNOSIS — S0003XA Contusion of scalp, initial encounter: Secondary | ICD-10-CM | POA: Insufficient documentation

## 2016-10-01 DIAGNOSIS — S0081XA Abrasion of other part of head, initial encounter: Secondary | ICD-10-CM | POA: Diagnosis not present

## 2016-10-01 DIAGNOSIS — S199XXA Unspecified injury of neck, initial encounter: Secondary | ICD-10-CM | POA: Diagnosis not present

## 2016-10-01 DIAGNOSIS — Y929 Unspecified place or not applicable: Secondary | ICD-10-CM | POA: Insufficient documentation

## 2016-10-01 DIAGNOSIS — G40909 Epilepsy, unspecified, not intractable, without status epilepticus: Secondary | ICD-10-CM | POA: Diagnosis not present

## 2016-10-01 LAB — CBG MONITORING, ED: GLUCOSE-CAPILLARY: 85 mg/dL (ref 65–99)

## 2016-10-01 MED ORDER — IBUPROFEN 400 MG PO TABS
600.0000 mg | ORAL_TABLET | Freq: Once | ORAL | Status: AC
  Administered 2016-10-01: 600 mg via ORAL
  Filled 2016-10-01: qty 1

## 2016-10-01 NOTE — ED Provider Notes (Signed)
[facility] DEPT Provider Note   CSN: [id number] Arrival date & time: 10/01/16  1230     History   Chief Complaint Chief Complaint  Patient presents with  . Seizures    HPI Andrew [name] is a 45 y.o. male who presents with a head injury after a fall s/p seizure. PMH significant for seizure disorder. His mother is at bedside. She states he took all of his seizure medicine today however he still has 2-3 breakthrough seizures daily. He wears a helmet for protection and was wearing at the time of the injury. They went to they psychiatrist and as they were leaving the office he had a seizure and fell and his head hit the curb. He was able to get up afterwards however his mother noticed there was blood. She took his helmet off and noticed an abrasion on his forehead as well as a lump on the side of his head. The patient reports blurry vision and dental pain. He denies headache, neck pain, chest pain, SOB, abdominal pain, N/V, weakness, numbness, or tingling in the extremities. He is not on blood thinners.   HPI  Past Medical History:  Diagnosis Date  . Anxiety   . Depression   . Seizures [facility])     Patient Active Problem List   Diagnosis Date Noted  . Seizures ([place])   . Anxiety     Past Surgical History:  Procedure Laterality Date  . none         Home Medications    Prior to Admission medications   Medication Sig Start Date End Date Taking? Authorizing Provider  busPIRone (BUSPAR) 15 MG tablet Take 15 mg by mouth 3 (three) times daily.     Historical Provider, MD  Cariprazine HCl (VRAYLAR) 1.5 MG CAPS Take 1.5 mg by mouth at bedtime.     Historical Provider, MD  KEPPRA 500 MG tablet take 3 tablets by mouth twice a day Brand Medically Necessary Patient taking differently: Take 1,500 mg by mouth 2 (two) times daily. take 3 tablets by mouth twice a day Brand Medically Necessary 03/08/16   Dennie Bible, NP  phenytoin (DILANTIN) 100 MG ER capsule Take 2 capsules (200  mg total) by mouth 2 (two) times daily. 03/08/16   Dennie Bible, NP  topiramate (TOPAMAX) 100 MG tablet take 1 tablet by mouth twice a day with 50mg  to total 150mg  bid Patient taking differently: Take 100 mg by mouth 2 (two) times daily. take 1 tablet by mouth twice a day with 50mg  to total 150mg  bid 03/08/16   Dennie Bible, NP  topiramate (TOPAMAX) 50 MG tablet Take 1 tablet (50 mg total) by mouth 2 (two) times daily. Patient taking differently: Take 50 mg by mouth 2 (two) times daily. Takes along with the 100mg  to make a total of 150mg  twice per day 03/08/16   Dennie Bible, NP  VIMPAT 150 MG TABS take 1 tablet by mouth twice a day 08/30/16   Dennie Bible, NP    Family History Family History  Problem Relation Age of Onset  . Asthma Father     Social History Social History  Substance Use Topics  . Smoking status: Current Every Day Smoker    Packs/day: 0.50    Years: 20.00    Types: Cigarettes  . Smokeless tobacco: Never Used  . Alcohol use No     Allergies   Patient has no known allergies.   Review of Systems Review of Systems  HENT: Positive for dental problem.   Respiratory: Negative for shortness of breath.   Cardiovascular: Negative for chest pain.  Gastrointestinal: Negative for abdominal pain, nausea and vomiting.  Musculoskeletal: Positive for arthralgias. Negative for back pain, joint swelling and neck pain.  Skin: Positive for wound.  Neurological: Positive for seizures. Negative for dizziness, weakness, light-headedness, numbness and headaches.  All other systems reviewed and are negative.    Physical Exam Updated Vital Signs BP 136/86 (BP Location: Left Arm)   Pulse 77   Temp 98.5 F (36.9 C) (Oral)   Resp 22   SpO2 100%   Physical Exam  Constitutional: He is oriented to person, place, and time. He appears well-developed and well-nourished. No distress.  He is sleepy but responds to questions  HENT:  Head: Normocephalic.  Head is without raccoon's eyes and without Battle's sign.  Right Ear: No hemotympanum.  Left Ear: No hemotympanum.  Nose: No epistaxis.  Mouth/Throat: Abnormal dentition.  3cm skin abrasion over right forehead. Hematoma over right lateral scalp.   Poor dentition. Upper right molar is loose. No tongue or lip laceration  Eyes: Conjunctivae are normal. Pupils are equal, round, and reactive to light. Right eye exhibits no discharge. Left eye exhibits no discharge. No scleral icterus.  Neck: Normal range of motion. Neck supple.  No midline tenderness  Cardiovascular: Normal rate and regular rhythm.  Exam reveals no gallop and no friction rub.   No murmur heard. Pulmonary/Chest: Effort normal. No respiratory distress. He has wheezes. He has no rales. He exhibits no tenderness.  Scattered wheezes  Abdominal: Soft. Bowel sounds are normal. He exhibits no distension and no mass. There is no tenderness. There is no rebound and no guarding. No hernia.  Musculoskeletal: He exhibits no edema.  Right shoulder: No obvious swelling or deformity. Tenderness to palpation over biceps tendon and lateral shoulder. FROM. N/V intact.  Neurological: He is alert and oriented to person, place, and time.  Mental Status:  Alert, oriented. Somewhat lethargic but responds appropriately to questions. Able to follow 2 step commands without difficulty.  Cranial Nerves:  II:  Peripheral visual fields grossly normal, pupils equal, round, reactive to light III,IV, VI: ptosis not present, extra-ocular motions intact bilaterally  V,VII: smile symmetric, facial light touch sensation equal VIII: hearing grossly normal to voice  X: uvula elevates symmetrically  XI: bilateral shoulder shrug symmetric and strong XII: midline tongue extension without fassiculations Motor:  Normal tone. 5/5 in upper and lower extremities bilaterally including strong and equal grip strength and dorsiflexion/plantar flexion Sensory: Pinprick and  light touch normal in all extremities.  Cerebellar: normal finger-to-nose with bilateral upper extremities Gait: Normal CV: distal pulses palpable throughout    Skin: Skin is warm and dry.  Psychiatric: He has a normal mood and affect.  Nursing note and vitals reviewed.    ED Treatments / Results  Labs (all labs ordered are listed, but only abnormal results are displayed) Labs Reviewed  CBG MONITORING, ED    EKG  EKG Interpretation None       Radiology Ct Head Wo Contrast  Result Date: 10/01/2016 CLINICAL DATA:  Patient with history of seizure. Right forehead abrasion. No neck pain. EXAM: CT HEAD WITHOUT CONTRAST CT CERVICAL SPINE WITHOUT CONTRAST TECHNIQUE: Multidetector CT imaging of the head and cervical spine was performed following the standard protocol without intravenous contrast. Multiplanar CT image reconstructions of the cervical spine were also generated. COMPARISON:  CT brain and C-spine 10/03/2013. FINDINGS: CT HEAD FINDINGS Brain:  No evidence of acute infarction, hemorrhage, hydrocephalus, extra-axial collection or mass lesion/mass effect. Vascular: No hyperdense vessel or unexpected calcification. Skull: Normal. Negative for fracture or focal lesion. Soft tissue swelling overlying the right calvarium. Sinuses/Orbits: Mild mucosal thickening ethmoid air cells. Mastoid air cells are well aerated. Mucous retention cyst right maxillary sinus. Other: None. CT CERVICAL SPINE FINDINGS Alignment: Reversal of the normal cervical lordosis. Skull base and vertebrae: Intact. Soft tissues and spinal canal: No prevertebral fluid or swelling. No visible canal hematoma. Disc levels: Multilevel degenerative disc disease most pronounced C4-5, C5-6 and C6-7. No evidence for acute cervical spine fracture. Upper chest: Unremarkable. Other: None. IMPRESSION: No acute intracranial process. No acute cervical spine fracture. Electronically Signed   By: Lovey Newcomer M.D.   On: 10/01/2016 15:07    Ct Cervical Spine Wo Contrast  Result Date: 10/01/2016 CLINICAL DATA:  Patient with history of seizure. Right forehead abrasion. No neck pain. EXAM: CT HEAD WITHOUT CONTRAST CT CERVICAL SPINE WITHOUT CONTRAST TECHNIQUE: Multidetector CT imaging of the head and cervical spine was performed following the standard protocol without intravenous contrast. Multiplanar CT image reconstructions of the cervical spine were also generated. COMPARISON:  CT brain and C-spine 10/03/2013. FINDINGS: CT HEAD FINDINGS Brain: No evidence of acute infarction, hemorrhage, hydrocephalus, extra-axial collection or mass lesion/mass effect. Vascular: No hyperdense vessel or unexpected calcification. Skull: Normal. Negative for fracture or focal lesion. Soft tissue swelling overlying the right calvarium. Sinuses/Orbits: Mild mucosal thickening ethmoid air cells. Mastoid air cells are well aerated. Mucous retention cyst right maxillary sinus. Other: None. CT CERVICAL SPINE FINDINGS Alignment: Reversal of the normal cervical lordosis. Skull base and vertebrae: Intact. Soft tissues and spinal canal: No prevertebral fluid or swelling. No visible canal hematoma. Disc levels: Multilevel degenerative disc disease most pronounced C4-5, C5-6 and C6-7. No evidence for acute cervical spine fracture. Upper chest: Unremarkable. Other: None. IMPRESSION: No acute intracranial process. No acute cervical spine fracture. Electronically Signed   By: Lovey Newcomer M.D.   On: 10/01/2016 15:07    Procedures Procedures (including critical care time)  Medications Ordered in ED Medications  ibuprofen (ADVIL,MOTRIN) tablet 600 mg (600 mg Oral Given 10/01/16 1626)     Initial Impression / Assessment and Plan / ED Course  I have reviewed the triage vital signs and the nursing notes.  Pertinent labs & imaging results that were available during my care of the patient were reviewed by me and considered in my medical decision making (see chart for  details).  Clinical Course    45 year old male presents with head injury after a seizure. Patient is afebrile, not tachycardic or tachypneic, normotensive, and not hypoxic. He has been compliant with meds. CT head and neck negative. Upper right molar is loose -advised dental follow up. R shoulder pain is mild and patient has full ROM and good strength - advised reevaluation if symptoms are worsening. Pain treated in ED. Patient is NAD, non-toxic, with stable VS. Patient is informed of clinical course, understands medical decision making process, and agrees with plan. Opportunity for questions provided and all questions answered. Return precautions given.   Final Clinical Impressions(s) / ED Diagnoses   Final diagnoses:  Injury of head, initial encounter  Acute pain of right shoulder  Seizure [facility])    New Prescriptions New Prescriptions   No medications on file     Recardo Evangelist, PA-C 10/02/16 [place], MD 10/02/16 1537

## 2016-10-01 NOTE — Discharge Instructions (Signed)
Take Tylenol or Ibuprofen for pain Follow up with dentist for loose tooth Return for worsening symptoms

## 2016-10-01 NOTE — ED Triage Notes (Signed)
[facility]- pt coming from doctor appt after a witnessed seizure. Pt has hx of seizures and has one daily. He wears a helmet due to seizures. Pt hit his head on sidewalk today. He has abrasion to right forehead and a hematoma above the right ear. Pt a&o X4 on arrival.

## 2016-10-13 ENCOUNTER — Other Ambulatory Visit: Payer: Self-pay

## 2016-10-13 NOTE — Patient Outreach (Signed)
[facility]) Care Management  10/13/2016  Andrew [name] Dec 16, 1970 [id number]  REFERRAL DATE: 10/04/16 REFERRAL SOURCE: ED census referral REFERRAL REASON: 4 or more ED visits within past 6 months.  CONSENT: [facility] designated party release, Sander Nephew  Telephone call to patients designated party release/ mother/ caregiver, Bernhardt Alzamora.  HIPAA verified for patient. Ms. Vivona states patient does not have primary care giver.  Ms. Rolls states patient sees his neurologist and psychiatric doctors regular. States they are patients primary doctors.  Ms. Mattheis denied patient having any needs at this point. Patient has medicaid.  Refused any additional care management services.   PLAN:  RNCM will refer patient to care management assistant to close due to not being eligible for [facility] care management program.   Quinn Plowman RN,BSN,CCM [facility] Telephonic  [phone]

## 2016-10-20 DIAGNOSIS — F331 Major depressive disorder, recurrent, moderate: Secondary | ICD-10-CM | POA: Diagnosis not present

## 2016-10-25 DIAGNOSIS — F331 Major depressive disorder, recurrent, moderate: Secondary | ICD-10-CM | POA: Diagnosis not present

## 2016-11-08 ENCOUNTER — Encounter: Payer: Self-pay | Admitting: Neurology

## 2016-11-08 ENCOUNTER — Ambulatory Visit (INDEPENDENT_AMBULATORY_CARE_PROVIDER_SITE_OTHER): Payer: Medicare Other | Admitting: Neurology

## 2016-11-08 VITALS — BP 122/89 | HR 67 | Ht 69.5 in | Wt 221.0 lb

## 2016-11-08 DIAGNOSIS — R569 Unspecified convulsions: Secondary | ICD-10-CM | POA: Diagnosis not present

## 2016-11-08 DIAGNOSIS — F419 Anxiety disorder, unspecified: Secondary | ICD-10-CM | POA: Diagnosis not present

## 2016-11-08 MED ORDER — LACOSAMIDE 150 MG PO TABS: 300.0000 mg | ORAL_TABLET | Freq: Two times a day (BID) | ORAL | 4 refills | Status: DC

## 2016-11-08 MED ORDER — TOPIRAMATE 100 MG PO TABS: 200.0000 mg | ORAL_TABLET | Freq: Two times a day (BID) | ORAL | 4 refills | Status: DC

## 2016-11-08 MED ORDER — KEPPRA 500 MG PO TABS: 1500.0000 mg | ORAL_TABLET | Freq: Two times a day (BID) | ORAL | 4 refills | Status: DC

## 2016-11-08 NOTE — Patient Instructions (Addendum)
  Dilantin ER 200mg  2 tabs twice a day.  1st week, one tab twice a day 2nd week, one tab every night 3rd week, stop  Increase vimpat to 150mg  2 tab twice a day at 1st week. Increase topamax to 100mg  2 tabs twice a day at 3rd week. Continue Keppra 500mg  3 tab twice a day.

## 2016-11-08 NOTE — Progress Notes (Signed)
[facility]  PATIENT: Andrew [name] DOB: May 09, 1971  HISTORY OF PRESENT ILLNESS:[name], 45 year old male returns for followup. He is with his motherHe has a history of intractable seizure disorder since 46 years old. He has been followed in the office since 1990. 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  He has had EMU monitoring at [facility] in the past that included abnormal baseline EEG due to intermittent predominantly right temporal spike, sharp right,and focal slowing. He continues to refuse surgery to remove irritable focus.  Over the years, he has tried different combination, he has been on current medications  Topamax 100 mg twice a day, +50 mg twice a day, Dilantin 100 mg 2 tablets twice a day, Keppra 500 mg 3 tablets b.i.d, brand name  He is also taking Buspirone 10 mg t.i.d., citalopram 20 mg every day for his depression and angry control prescribed by psychiatry.  He continues to have 2-3 seizures every day, complex partial with secondary generalization, he had burst open few helmet due to his seizure, he continued to smoke marijuana and cigarette. He helps his mother taking care of his elderly bed ridden grandmother at home, has raging spells sometimes.   Vimpat 150 twice a day was added since 2015,   He still has seizure almost daily,  He is now taking Topamax 100 plus 50 mg twice a day , Dilantin 200 mg twice a day, Keppra 500 mg 3 tablets twice a day, Vimpat 150 mg twice a day   REVIEW OF SYSTEMS: Full 14 system review of systems performed and notable only for those listed, all others are neg:  Recurrent seizure   ALLERGIES: No Known Allergies  HOME MEDICATIONS: Outpatient Medications Prior to Visit  Medication Sig Dispense Refill  . busPIRone (BUSPAR) 15 MG tablet Take 15 mg by mouth 3 (three) times daily.     Marland Kitchen KEPPRA 500 MG tablet take 3 tablets by mouth twice a day Brand Medically Necessary (Patient taking  differently: Take 1,500 mg by mouth 2 (two) times daily. take 3 tablets by mouth twice a day Brand Medically Necessary) 540 tablet 3  . phenytoin (DILANTIN) 100 MG ER capsule Take 2 capsules (200 mg total) by mouth 2 (two) times daily. 360 capsule 3  . topiramate (TOPAMAX) 100 MG tablet take 1 tablet by mouth twice a day with 50mg  to total 150mg  bid (Patient taking differently: Take 100 mg by mouth 2 (two) times daily. take 1 tablet by mouth twice a day with 50mg  to total 150mg  bid) 180 tablet 3  . topiramate (TOPAMAX) 50 MG tablet Take 1 tablet (50 mg total) by mouth 2 (two) times daily. (Patient taking differently: Take 50 mg by mouth 2 (two) times daily. Takes along with the 100mg  to make a total of 150mg  twice per day) 180 tablet 3  . VIMPAT 150 MG TABS take 1 tablet by mouth twice a day 60 tablet 5  . Cariprazine HCl (VRAYLAR) 1.5 MG CAPS Take 1.5 mg by mouth at bedtime.      No facility-administered medications prior to visit.     PAST MEDICAL HISTORY: Past Medical History:  Diagnosis Date  . Anxiety   . Depression   . Seizures ([place])     PAST SURGICAL HISTORY: Past Surgical History:  Procedure Laterality Date  . none      FAMILY HISTORY: Family History  Problem Relation Age of Onset  . Asthma Father     SOCIAL HISTORY: Social History  Social History  . Marital status: Single    Spouse name: N/A  . Number of children: 0  . Years of education: 12   Occupational History  .      Disabled   Social History Main Topics  . Smoking status: Current Every Day Smoker    Packs/day: 0.50    Years: 20.00    Types: Cigarettes  . Smokeless tobacco: Never Used  . Alcohol use No  . Drug use: No     Comment: 11/08/16 - reports he has stopped smoking marijuana.  . Sexual activity: Not on file   Other Topics Concern  . Not on file   Social History Narrative   Patient lives at home with his mother Jumarion Barreca ). Patient is disabled. Patient has high school education.    Left handed.   Caffeine- soda - pepsi four cans daily.   Patients father died of over dose.-40     PHYSICAL EXAM  Vitals:   11/08/16 1120  BP: 122/89  Pulse: 67  Weight: 221 lb (100.2 kg)  Height: 5' 9.5" (1.765 m)   Body mass index is 32.17 kg/m. Generalized: Well developed, in no acute distress  Head: normocephalic and atraumatic,. gingival hypertrophy, wearing helmet  Neck: Supple, no carotid bruits  Musculoskeletal: No deformity  Neurological examination  Mentation: Alert oriented to time, place, history taking. Follows most commands speech and language fluent  Cranial nerve II-XII: Pupils were equal round reactive to light extraocular movements were full, visual field were full on confrontational test. Facial sensation and strength were normal. hearing was intact to finger rubbing bilaterally. Uvula tongue midline. head turning and shoulder shrug were normal and symmetric.Tongue protrusion into cheek strength was normal.  Motor: normal bulk and tone, full strength in the BUE, BLE, fine finger movements normal, no pronator drift. No focal weakness  Coordination: finger-nose-finger, heel-to-shin bilaterally, no dysmetria  Reflexes: Brachioradialis 2/2, biceps 2/2, triceps 2/2, patellar 2/2, Achilles 2/2, plantar responses were flexor bilaterally.  Gait and Station: Rising up from seated position without assistance, narrow based, moderate stride, steady gait, no assistive device   DIAGNOSTIC DATA (LABS, IMAGING, TESTING) -   ASSESSMENT AND PLAN 45 y.o. year old male  has a past medical history of  Intractable seizure Increase Topamax to 100 mg 2 tablets twice a day Increase Vimpat to 150 mg 2 tablets twice a day Keep Keppra 500 mg 3 tablets twice a day Tapering off Dilantin I also provide information for vagus nerve stimulation  Marcial Pacas, M.D. Ph.D.  [facility] [place], [place] [address] Phone: [phone] Fax:       [phone]

## 2016-11-09 ENCOUNTER — Other Ambulatory Visit: Payer: Self-pay | Admitting: *Deleted

## 2016-11-09 ENCOUNTER — Telehealth: Payer: Self-pay | Admitting: Neurology

## 2016-11-09 ENCOUNTER — Encounter: Payer: Self-pay | Admitting: *Deleted

## 2016-11-09 NOTE — Telephone Encounter (Signed)
Called [facility] [phone]) - pt [id number] - Vimpat approved through 11/09/17 - case [id number].  Topamax did not require PA.  Keppra PA on file through 11/12/2021.  Returned call to patient's mother to let her know his medication has been approved.  Also, notified [facility].

## 2016-12-13 DIAGNOSIS — F331 Major depressive disorder, recurrent, moderate: Secondary | ICD-10-CM | POA: Diagnosis not present

## 2016-12-21 DIAGNOSIS — R569 Unspecified convulsions: Secondary | ICD-10-CM | POA: Diagnosis not present

## 2016-12-22 ENCOUNTER — Emergency Department (HOSPITAL_COMMUNITY): Payer: Medicare Other

## 2016-12-22 ENCOUNTER — Emergency Department (HOSPITAL_COMMUNITY)
Admission: EM | Admit: 2016-12-22 | Discharge: 2016-12-22 | Disposition: A | Discharge status: Home / Self Care | Payer: Medicare Other | Attending: Emergency Medicine | Admitting: Emergency Medicine

## 2016-12-22 ENCOUNTER — Encounter (HOSPITAL_COMMUNITY): Payer: Self-pay | Admitting: Emergency Medicine

## 2016-12-22 DIAGNOSIS — R569 Unspecified convulsions: Secondary | ICD-10-CM | POA: Diagnosis not present

## 2016-12-22 DIAGNOSIS — F17210 Nicotine dependence, cigarettes, uncomplicated: Secondary | ICD-10-CM | POA: Insufficient documentation

## 2016-12-22 DIAGNOSIS — G40909 Epilepsy, unspecified, not intractable, without status epilepticus: Secondary | ICD-10-CM | POA: Diagnosis not present

## 2016-12-22 DIAGNOSIS — S0990XA Unspecified injury of head, initial encounter: Secondary | ICD-10-CM | POA: Diagnosis not present

## 2016-12-22 DIAGNOSIS — R251 Tremor, unspecified: Secondary | ICD-10-CM | POA: Insufficient documentation

## 2016-12-22 NOTE — ED Provider Notes (Signed)
[place] DEPT Provider Note   CSN: [id number] Arrival date & time: 12/22/16  0006   By signing my name below, I, Evelene Croon, attest that this documentation has been prepared under the direction and in the presence of Delora Fuel, MD . Electronically Signed: Evelene Croon, Scribe. 12/22/2016. 12:44 AM.  History   Chief Complaint Chief Complaint  Patient presents with  . Seizures    The history is provided by the patient, medical records and a relative (cousin). No language interpreter was used.    HPI Comments:  Andrew [name] is a 46 y.o. male with a history of seizures who presents to the Emergency Department with family s/p unwitnessed seizure just PTA. Family states he may have struck his head on the stove and injured his back as well. Family notes the glass of the stove was shattered when he made it to the pt. Pt denies pain at this time. In December 2017 his dilantin was discontinued, vimpat and topmax increased, and keppra was kept the same.  Family also reports recent tremors since the pt was placed on bipolar medication.   Past Medical History:  Diagnosis Date  . Anxiety   . Depression   . Seizures [facility])     Patient Active Problem List   Diagnosis Date Noted  . Seizures ([place])   . Anxiety     Past Surgical History:  Procedure Laterality Date  . none         Home Medications    Prior to Admission medications   Medication Sig Start Date End Date Taking? Authorizing Provider  busPIRone (BUSPAR) 15 MG tablet Take 15 mg by mouth 3 (three) times daily.     Historical Provider, MD  KEPPRA 500 MG tablet Take 3 tablets (1,500 mg total) by mouth 2 (two) times daily. take 3 tablets by mouth twice a day Brand Medically Necessary 11/08/16   Marcial Pacas, MD  Lacosamide 150 MG TABS Take 2 tablets (300 mg total) by mouth 2 (two) times daily. 11/08/16   Marcial Pacas, MD  topiramate (TOPAMAX) 100 MG tablet Take 2 tablets (200 mg total) by mouth 2 (two) times daily.  11/08/16   Marcial Pacas, MD  VRAYLAR 3 MG CAPS at bedtime. 10/20/16   Historical Provider, MD    Family History Family History  Problem Relation Age of Onset  . Asthma Father     Social History Social History  Substance Use Topics  . Smoking status: Current Every Day Smoker    Packs/day: 0.50    Years: 20.00    Types: Cigarettes  . Smokeless tobacco: Never Used  . Alcohol use No     Allergies   Patient has no known allergies.   Review of Systems Review of Systems  Constitutional: Negative for fever.  Neurological: Positive for tremors and seizures.  All other systems reviewed and are negative.  Physical Exam Updated Vital Signs BP 141/86 (BP Location: Left Arm)   Pulse 89   Temp 98.9 F (37.2 C) (Oral)   Resp 18   SpO2 100%   Physical Exam  Constitutional: He is oriented to person, place, and time. He appears well-developed and well-nourished.  HENT:  Head: Normocephalic and atraumatic.  Eyes: EOM are normal. Pupils are equal, round, and reactive to light.  Neck: Normal range of motion. Neck supple. No JVD present.  Cardiovascular: Normal rate, regular rhythm and normal heart sounds.   No murmur heard. Pulmonary/Chest: Effort normal and breath sounds normal. He has no  wheezes. He has no rales. He exhibits no tenderness.  Abdominal: Soft. Bowel sounds are normal. He exhibits no distension and no mass. There is no tenderness.  Musculoskeletal: Normal range of motion. He exhibits no edema.  Lymphadenopathy:    He has no cervical adenopathy.  Neurological: He is alert and oriented to person, place, and time. No cranial nerve deficit. He exhibits normal muscle tone. Coordination normal.  Skin: Skin is warm and dry. No rash noted.  Psychiatric: He has a normal mood and affect. His behavior is normal. Judgment and thought content normal.  Nursing note and vitals reviewed.  ED Treatments / Results  DIAGNOSTIC STUDIES:  Oxygen Saturation is 100% on RA, normal by my  interpretation.    COORDINATION OF CARE:  12:43 AM Discussed treatment plan with pt and family at bedside.   Radiology Ct Head Wo Contrast  Result Date: 12/22/2016 CLINICAL DATA:  [place] tonight. EXAM: CT HEAD WITHOUT CONTRAST TECHNIQUE: Contiguous axial images were obtained from the base of the skull through the vertex without intravenous contrast. COMPARISON:  10/01/2016 FINDINGS: Brain: There is no intracranial hemorrhage, mass or evidence of acute infarction. There is no extra-axial fluid collection. Gray matter and white matter appear normal. Cerebral volume is normal for age. Brainstem and posterior fossa are unremarkable. The CSF spaces appear normal. Vascular: No hyperdense vessel or unexpected calcification. Skull: Normal. Negative for fracture or focal lesion. Sinuses/Orbits: No acute finding. Other: None. IMPRESSION: Normal brain Electronically Signed   By: Andreas Newport M.D.   On: 12/22/2016 01:25    Procedures Procedures (including critical care time)  Medications Ordered in ED Medications - No data to display   Initial Impression / Assessment and Plan / ED Course  I have reviewed the triage vital signs and the nursing notes.  Pertinent imaging results that were available during my care of the patient were reviewed by me and considered in my medical decision making (see chart for details).  Patient with known difficult to control seizure disorder comes in following a seizure at home. Main concern was that he did not have his helmet on and he did hit his head. There is no change in his mental status following the fall. He is sent for CT of the head which shows no evidence of acute injury. Old records are reviewed confirming complex seizure disorder with also underlying psychiatric disorder. One month ago, he was weaned off of Dilantin and had his doses of Vimpat and Topamax increased and his dose of Keppra maintained. Nondepressed current medications are ones amenable to  getting blood values back in a reasonable timeframe. He does show some tremulousness worrisome for possible medication associated Parkinson's related to his psychiatric medications. I discussed with patient and with his family need to coordinate his psychiatrist and neurologist to get optimum medications.  Final Clinical Impressions(s) / ED Diagnoses   Final diagnoses:  Seizure [facility])    New Prescriptions New Prescriptions   No medications on file   I personally performed the services described in this documentation, which was scribed in my presence. The recorded information has been reviewed and is accurate.       Delora Fuel, MD [phone]

## 2016-12-22 NOTE — ED Triage Notes (Signed)
Per EMS, pt from home. Hx of seizures. Pt had recent medication change for an upcoming procedure and since then has had tremors and increased seizure activity. Pt has been wearing a helmet for precautions. Pt had taken helmet off since he had not had a seizure today but ended up having one in the kitchen and hitting his head on the oven and glass shattered. Pt does not remember the fall, denies pain. Pt had 3 seizures with EMS that last about ten seconds each with arm tremors. EMS CBG 100.

## 2016-12-22 NOTE — Discharge Instructions (Signed)
Work with your psychiatrist and neurologist to adjust your medications.

## 2016-12-22 NOTE — ED Notes (Signed)
ED Provider at bedside. 

## 2016-12-23 ENCOUNTER — Telehealth: Payer: Self-pay | Admitting: Neurology

## 2016-12-23 NOTE — Telephone Encounter (Signed)
Returned call to Andrew [name] (pt's mother on HIPAA).  Andrew [name] is taking the following:  1) Vimpat 150mg , 2 tabs BID 2) Topamax 100mg , 2 tabs BID 3) Keppra 500mg , 3 tabs BID  He is taking his medications, as prescribed, with no missed doses reported.  He discontinued Dilantin approximately 3 weeks ago.  She is concerned about intermittent episodes of shaking, difficulty speaking and confusion.  Also, she has noticed an overall slowing of his gait/movements.  She is requesting an appointment to have his medications and levels evaluated.

## 2016-12-23 NOTE — Telephone Encounter (Signed)
Pt's mother called said he has been weaned off dilantin for about 3 weeks. She said the vimpat has been increased to 150mg  bid and topamax increased to 100mg  bid. She said he is shaking, not talking clearly, he is using her glasses to read, said he is still having seizures, he his very passive. Please call

## 2016-12-24 ENCOUNTER — Ambulatory Visit (INDEPENDENT_AMBULATORY_CARE_PROVIDER_SITE_OTHER): Payer: Medicare Other | Admitting: Nurse Practitioner

## 2016-12-24 ENCOUNTER — Encounter: Payer: Self-pay | Admitting: Nurse Practitioner

## 2016-12-24 VITALS — BP 123/84 | HR 84 | Ht 69.5 in | Wt 223.4 lb

## 2016-12-24 DIAGNOSIS — R569 Unspecified convulsions: Secondary | ICD-10-CM

## 2016-12-24 DIAGNOSIS — G219 Secondary parkinsonism, unspecified: Secondary | ICD-10-CM | POA: Diagnosis not present

## 2016-12-24 DIAGNOSIS — G40209 Localization-related (focal) (partial) symptomatic epilepsy and epileptic syndromes with complex partial seizures, not intractable, without status epilepticus: Secondary | ICD-10-CM | POA: Diagnosis not present

## 2016-12-24 DIAGNOSIS — F319 Bipolar disorder, unspecified: Secondary | ICD-10-CM | POA: Insufficient documentation

## 2016-12-24 NOTE — Patient Instructions (Addendum)
Continue Keppra at current dose Continue Topamax at current dose Continue  Vimpat at current dose Will check labs today Return in 3 months

## 2016-12-24 NOTE — Progress Notes (Signed)
I agree with the above plan 

## 2016-12-24 NOTE — Progress Notes (Signed)
[facility]  PATIENT: Andrew [name] DOB: 1970-12-17   REASON FOR VISIT: Follow-up for intractable seizure disorder with recent seizure seen in the ER long history of bipolar disorder  HISTORY FROM: Patient and mom    HISTORY OF PRESENT ILLNESS:UPDATE 2/1/18CM Mr. Pittinger, 46 year old male returns for follow-up with his mom. He was seen in the emergency room January 31 for unwitnessed seizure. Apparently he hit his head on the stove. He was not wearing a helmet therefore CT of the head was done without evidence of acute injury .Mom also reports recent tremors with new bipolar medication. She can't remember how long he has been on the medication Vraylar but less than 1 year. He was last seen in our office December 18 by Dr. Krista Blue. He has been followed at [facility] since 1990. EMU monitoring at [facility] in the past included abnormal baseline EEG due to intermittent predominantly right spike sharp right and focal slowing. He continues to refuse surgery to remove the irritable focus. He has been given information on VNS vagal nerve stimulator which the mom has read and is interested. He was titrated completely off of his Dilantin after his December visit. He is currently on Vimpat and Keppra and Topamax. He sees his psychiatrist every 6 months with a counselor more often. He returns for reevaluation  REVIEW OF SYSTEMS: Full 14 system review of systems performed and notable only for those listed, all others are neg:  Constitutional: neg  Cardiovascular: neg Ear/Nose/Throat: neg  Skin: neg Eyes: Blurred vision Respiratory: neg Gastroitestinal: neg  Hematology/Lymphatic: neg  Endocrine: neg Musculoskeletal: Walking difficulty Allergy/Immunology: neg Neurological: Intractable seizure disorder, tremors Psychiatric: neg Sleep : neg   ALLERGIES: No Known Allergies  HOME MEDICATIONS: Outpatient Medications Prior to Visit  Medication Sig Dispense Refill  . busPIRone (BUSPAR) 15 MG  tablet Take 15 mg by mouth 3 (three) times daily.     Marland Kitchen KEPPRA 500 MG tablet Take 3 tablets (1,500 mg total) by mouth 2 (two) times daily. take 3 tablets by mouth twice a day Brand Medically Necessary (Patient taking differently: Take 1,500 mg by mouth 2 (two) times daily. Brand Medically Necessary) 540 tablet 4  . Lacosamide 150 MG TABS Take 2 tablets (300 mg total) by mouth 2 (two) times daily. 360 tablet 4  . topiramate (TOPAMAX) 100 MG tablet Take 2 tablets (200 mg total) by mouth 2 (two) times daily. 360 tablet 4  . VRAYLAR 3 MG CAPS Take 3 mg by mouth at bedtime.      No facility-administered medications prior to visit.     PAST MEDICAL HISTORY: Past Medical History:  Diagnosis Date  . Anxiety   . Depression   . Seizures ([place])     PAST SURGICAL HISTORY: Past Surgical History:  Procedure Laterality Date  . none      FAMILY HISTORY: Family History  Problem Relation Age of Onset  . Asthma Father     SOCIAL HISTORY: Social History   Social History  . Marital status: Single    Spouse name: N/A  . Number of children: 0  . Years of education: 12   Occupational History  .      Disabled   Social History Main Topics  . Smoking status: Current Every Day Smoker    Packs/day: 0.50    Years: 20.00    Types: Cigarettes  . Smokeless tobacco: Never Used  . Alcohol use No  . Drug use: No     Comment: 11/08/16 -  reports he has stopped smoking marijuana.  . Sexual activity: Not on file   Other Topics Concern  . Not on file   Social History Narrative   Patient lives at home with his mother Nyan Whan ). Patient is disabled. Patient has high school education.   Left handed.   Caffeine- soda - pepsi four cans daily.   Patients father died of over dose.-40     PHYSICAL EXAM  Vitals:   12/24/16 0855  BP: 123/84  Pulse: 84  Weight: 223 lb 6.4 oz (101.3 kg)  Height: 5' 9.5" (1.765 m)   Body mass index is 32.52 kg/m.  Generalized: Well developed, obese male in  no acute distress  Head: normocephalic and atraumatic,. gingival hypertrophy, wearing helmet  Neck: Supple, no carotid bruits  Cardiac: Regular rate rhythm, no murmur  Musculoskeletal: No deformity   Neurological examination   Mentation: Alert oriented to time, place, history taking. Attention span and concentration appropriate. Recent and remote memory intact.  Follows all commands speech and language fluent.   Cranial nerve II-XII: Pupils were equal round reactive to light extraocular movements were full, visual field were full on confrontational test. Facial sensation and strength were normal. hearing was intact to finger rubbing bilaterally. Uvula tongue midline. head turning and shoulder shrug were normal and symmetric.Tongue protrusion into cheek strength was normal. Motor: normal bulk and mildly increased tone , full strength in the BUE, BLE, rigidity at the wrist and elbows  Sensory: normal and symmetric to light touch, pinprick, and  Vibration, in the upper and lower extremities Coordination: finger-nose-finger, heel-to-shin bilaterally, no dysmetria, slowness of movement Reflexes: Symmetric upper and lower, plantar responses were flexor bilaterally. Gait and Station: Rising up from seated position without assistance, wide based  stance, slow moderate stride, decreased arm swing resting tremor  of left hand when walking,  Tandem gait is unsteady. No assistive device  DIAGNOSTIC DATA (LABS, IMAGING, TESTING) - I reviewed patient records, labs, notes, testing and imaging myself where available.  Lab Results  Component Value Date   WBC 10.9 (H) 08/12/2016   HGB 14.5 08/12/2016   HCT 42.7 08/12/2016   MCV 85.6 08/12/2016   PLT 261 08/12/2016      Component Value Date/Time   NA 140 08/12/2016 2032   NA 143 03/08/2016 1331   K 3.4 (L) 08/12/2016 2032   CL 112 (H) 08/12/2016 2032   CO2 21 (L) 08/12/2016 2032   GLUCOSE 135 (H) 08/12/2016 2032   BUN 14 08/12/2016 2032   BUN 9  03/08/2016 1331   CREATININE 1.14 08/12/2016 2032   CALCIUM 9.4 08/12/2016 2032   PROT 8.1 08/12/2016 2032   PROT 7.4 03/08/2016 1331   ALBUMIN 4.1 08/12/2016 2032   ALBUMIN 4.0 03/08/2016 1331   AST 18 08/12/2016 2032   ALT 22 08/12/2016 2032   ALKPHOS 101 08/12/2016 2032   BILITOT 0.3 08/12/2016 2032   BILITOT <0.2 03/08/2016 1331   GFRNONAA >60 08/12/2016 2032   GFRAA >60 08/12/2016 2032   ASSESSMENT AND PLAN  46 y.o. year old male  has a past medical history of Anxiety; Depression; and Seizures ([place]). 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 The patient is a current patient of Dr. Krista Blue  who is out of the office today . This note is sent to the work in doctor.     Continue Keppra 500 mg 3 tabs twice a day Continue  Topamax 100 mg 2 tabs twice a day Continue Vimpat 150 2 tabs twice a day Will check labs today Mom is interested in nerve stimulator and will return at scheduled appointment with Dr. Krista Blue to discuss further I spent 25 min in total face to face time with the patient and mom more than 50% of which was spent counseling and coordination of care, reviewing test results reviewing medications and discussing and reviewing the diagnosis of intractable seizure disorder, but the vagal nerve stimulator would help not only his seizure disorder but his depression and that his tremor and rigidity and slowness on exam is a side effect of his bipolar disorder medications. The mom thinks it is coming from the discontinuation  of Dilantin.  Dennie Bible, [facility], [facility], APRN  [facility] [address] [place],  [address] [phone]

## 2016-12-28 ENCOUNTER — Telehealth: Payer: Self-pay | Admitting: *Deleted

## 2016-12-28 LAB — CBC WITH DIFFERENTIAL/PLATELET
BASOS: 0 %
Basophils Absolute: 0 10*3/uL (ref 0.0–0.2)
EOS (ABSOLUTE): 0.3 10*3/uL (ref 0.0–0.4)
EOS: 3 %
HEMATOCRIT: 41 % (ref 37.5–51.0)
Hemoglobin: 13.9 g/dL (ref 13.0–17.7)
Immature Grans (Abs): 0 10*3/uL (ref 0.0–0.1)
Immature Granulocytes: 0 %
LYMPHS ABS: 2.8 10*3/uL (ref 0.7–3.1)
Lymphs: 30 %
MCH: 28.7 pg (ref 26.6–33.0)
MCHC: 33.9 g/dL (ref 31.5–35.7)
MCV: 85 fL (ref 79–97)
MONOS ABS: 0.8 10*3/uL (ref 0.1–0.9)
Monocytes: 8 %
Neutrophils Absolute: 5.4 10*3/uL (ref 1.4–7.0)
Neutrophils: 59 %
Platelets: 279 10*3/uL (ref 150–379)
RBC: 4.85 x10E6/uL (ref 4.14–5.80)
RDW: 13.9 % (ref 12.3–15.4)
WBC: 9.2 10*3/uL (ref 3.4–10.8)

## 2016-12-28 LAB — COMPREHENSIVE METABOLIC PANEL
A/G RATIO: 1.1 — AB (ref 1.2–2.2)
ALBUMIN: 3.9 g/dL (ref 3.5–5.5)
ALK PHOS: 99 IU/L (ref 39–117)
ALT: 22 IU/L (ref 0–44)
AST: 22 IU/L (ref 0–40)
BUN / CREAT RATIO: 10 (ref 9–20)
BUN: 11 mg/dL (ref 6–24)
Bilirubin Total: 0.2 mg/dL (ref 0.0–1.2)
CO2: 19 mmol/L (ref 18–29)
CREATININE: 1.15 mg/dL (ref 0.76–1.27)
Calcium: 9.4 mg/dL (ref 8.7–10.2)
Chloride: 108 mmol/L — ABNORMAL HIGH (ref 96–106)
GFR calc Af Amer: 89 mL/min/{1.73_m2} (ref >59)
GFR calc non Af Amer: 77 mL/min/{1.73_m2} (ref >59)
GLOBULIN, TOTAL: 3.4 g/dL (ref 1.5–4.5)
Glucose: 95 mg/dL (ref 65–99)
Potassium: 4.2 mmol/L (ref 3.5–5.2)
SODIUM: 143 mmol/L (ref 134–144)
Total Protein: 7.3 g/dL (ref 6.0–8.5)

## 2016-12-28 LAB — LACOSAMIDE: Lacosamide: 11.7 ug/mL — ABNORMAL HIGH (ref 5.0–10.0)

## 2016-12-28 LAB — LEVETIRACETAM LEVEL: Levetiracetam Lvl: 38.2 ug/mL (ref 10.0–40.0)

## 2016-12-28 LAB — TOPIRAMATE LEVEL: Topiramate Lvl: 9.7 ug/mL (ref 2.0–25.0)

## 2016-12-28 NOTE — Telephone Encounter (Signed)
-----   Message from Dennie Bible, NP sent at 12/28/2016  7:50 AM EST ----- Labs good , good blood levels of seizure meds. Please call the mom

## 2016-12-28 NOTE — Telephone Encounter (Signed)
Spoke to pts mother, and relayed that pts lab results looked good. His sz med levels were good.   She relayed understanding.

## 2016-12-29 DIAGNOSIS — F331 Major depressive disorder, recurrent, moderate: Secondary | ICD-10-CM | POA: Diagnosis not present

## 2017-01-21 DIAGNOSIS — F331 Major depressive disorder, recurrent, moderate: Secondary | ICD-10-CM | POA: Diagnosis not present

## 2017-01-31 ENCOUNTER — Telehealth: Payer: Self-pay | Admitting: Neurology

## 2017-01-31 NOTE — Telephone Encounter (Signed)
Received clearance request, via fax, from [facility].  The form has been completed with the information below and faxed back to their office.

## 2017-01-31 NOTE — Telephone Encounter (Signed)
Patient continued to have frequent seizures, is on polypharmacy treatment,  Clearance for dental procedure, make sure that he did not miss any of his seizure medication, it is okay to crush the pills,

## 2017-02-07 ENCOUNTER — Encounter: Payer: Self-pay | Admitting: Neurology

## 2017-02-07 ENCOUNTER — Ambulatory Visit (INDEPENDENT_AMBULATORY_CARE_PROVIDER_SITE_OTHER): Payer: Medicare Other | Admitting: Neurology

## 2017-02-07 VITALS — BP 130/81 | HR 67 | Ht 69.5 in | Wt 219.0 lb

## 2017-02-07 DIAGNOSIS — G40209 Localization-related (focal) (partial) symptomatic epilepsy and epileptic syndromes with complex partial seizures, not intractable, without status epilepticus: Secondary | ICD-10-CM

## 2017-02-07 DIAGNOSIS — F3160 Bipolar disorder, current episode mixed, unspecified: Secondary | ICD-10-CM

## 2017-02-07 NOTE — Progress Notes (Signed)
[facility]  PATIENT: Andrew [name] DOB: [birth year]  HISTORY OF PRESENT ILLNESS:Andrew [name], 46 year old male returns for followup. He is with his motherHe has a history of intractable seizure disorder since 46 years old. He has been followed in the office since 1990. 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  He has had EMU monitoring at [facility] in the past that included abnormal baseline EEG due to intermittent predominantly right temporal spike, sharp right,and focal slowing. He continues to refuse surgery to remove irritable focus.  Over the years, he has tried different combination, he has been on current medications  Topamax 100 mg twice a day, +50 mg twice a day, Dilantin 100 mg 2 tablets twice a day, Keppra 500 mg 3 tablets b.i.d, brand name  He is also taking Buspirone 10 mg t.i.d., citalopram 20 mg every day for his depression and angry control prescribed by psychiatry.  He continues to have 2-3 seizures every day, complex partial with secondary generalization, he had burst open few helmet due to his seizure, he continued to smoke marijuana and cigarette. He helps his mother taking care of his elderly bed ridden grandmother at home, has raging spells sometimes.   Vimpat 150 twice a day was added since 2015,   He still has seizure almost daily,  He is now taking Topamax 100 plus 50 mg twice a day , Dilantin 200 mg twice a day, Keppra 500 mg 3 tablets twice a day, Vimpat 150 mg twice a day  UPDATE February 07 2017: He came here urgently in early February 2018 because bilateral hands shaking, he was given the prescription of Vraylar, his shaking has much improved after stop the medication.  He is going through dental procedure, his mother had POA, they agreed to proceed with VNS  REVIEW OF SYSTEMS: Full 14 system review of systems performed and notable only for those listed, all others are neg:  Recurrent seizure   ALLERGIES: No Known  Allergies  HOME MEDICATIONS: Outpatient Medications Prior to Visit  Medication Sig Dispense Refill  . busPIRone (BUSPAR) 15 MG tablet Take 15 mg by mouth 3 (three) times daily.     Marland Kitchen KEPPRA 500 MG tablet Take 3 tablets (1,500 mg total) by mouth 2 (two) times daily. take 3 tablets by mouth twice a day Brand Medically Necessary (Patient taking differently: Take 1,500 mg by mouth 2 (two) times daily. Brand Medically Necessary) 540 tablet 4  . Lacosamide 150 MG TABS Take 2 tablets (300 mg total) by mouth 2 (two) times daily. 360 tablet 4  . topiramate (TOPAMAX) 100 MG tablet Take 2 tablets (200 mg total) by mouth 2 (two) times daily. 360 tablet 4  . VRAYLAR 3 MG CAPS Take 3 mg by mouth at bedtime.      No facility-administered medications prior to visit.     PAST MEDICAL HISTORY: Past Medical History:  Diagnosis Date  . Anxiety   . Depression   . Seizures ([place])     PAST SURGICAL HISTORY: Past Surgical History:  Procedure Laterality Date  . none      FAMILY HISTORY: Family History  Problem Relation Age of Onset  . Asthma Father     SOCIAL HISTORY: Social History   Social History  . Marital status: Single    Spouse name: N/A  . Number of children: 0  . Years of education: 12   Occupational History  .      Disabled   Social History Main Topics  .  Smoking status: Current Every Day Smoker    Packs/day: 0.50    Years: 20.00    Types: Cigarettes  . Smokeless tobacco: Never Used  . Alcohol use No  . Drug use: No     Comment: 11/08/16 - reports he has stopped smoking marijuana.  . Sexual activity: Not on file   Other Topics Concern  . Not on file   Social History Narrative   Patient lives at home with his mother Andrew [name] ). Patient is disabled. Patient has high school education.   Left handed.   Caffeine- soda - pepsi four cans daily.   Patients father died of over dose.-40     PHYSICAL EXAM  Vitals:   02/07/17 1428  BP: 130/81  Pulse: 67  Weight:  219 lb (99.3 kg)  Height: 5' 9.5" (1.765 m)   Body mass index is 31.88 kg/m. Generalized: Well developed, in no acute distress  Head: normocephalic and atraumatic,. gingival hypertrophy, wearing helmet  Neck: Supple, no carotid bruits  Musculoskeletal: No deformity  Neurological examination  Mentation: Alert oriented to time, place, history taking. Follows most commands speech and language fluent  Cranial nerve II-XII: Pupils were equal round reactive to light extraocular movements were full, visual field were full on confrontational test. Facial sensation and strength were normal. hearing was intact to finger rubbing bilaterally. Uvula tongue midline. head turning and shoulder shrug were normal and symmetric.Tongue protrusion into cheek strength was normal.  Motor: normal bulk and tone, full strength in the BUE, BLE, fine finger movements normal, no pronator drift. No focal weakness  Coordination: finger-nose-finger, heel-to-shin bilaterally, no dysmetria  Reflexes: Brachioradialis 2/2, biceps 2/2, triceps 2/2, patellar 2/2, Achilles 2/2, plantar responses were flexor bilaterally.  Gait and Station: Rising up from seated position without assistance, narrow based, moderate stride, steady gait,  DIAGNOSTIC DATA (LABS, IMAGING, TESTING) -   ASSESSMENT AND PLAN 45 y.o. year old male  has a past medical history of  Intractable seizur Keep Topamax to 100 mg 2 tablets twice a day Vimpat to 150 mg 2 tablets twice a day Keppra 500 mg 3 tablets twice a day He was able to successfully tapering off Dilantin without significant side effect noticed,  Refer him to vagus nerve stimulation  Andrew [name], M.D. Andrew [name].  [facility] [place], [place] [address] Phone: [phone] Fax:      [phone]

## 2017-02-28 ENCOUNTER — Other Ambulatory Visit: Payer: Self-pay | Admitting: Nurse Practitioner

## 2017-04-08 DIAGNOSIS — F331 Major depressive disorder, recurrent, moderate: Secondary | ICD-10-CM | POA: Diagnosis not present

## 2017-05-03 DIAGNOSIS — F331 Major depressive disorder, recurrent, moderate: Secondary | ICD-10-CM | POA: Diagnosis not present

## 2017-05-16 ENCOUNTER — Ambulatory Visit: Payer: Medicare Other | Admitting: Neurology

## 2017-05-26 ENCOUNTER — Other Ambulatory Visit: Payer: Self-pay | Admitting: Neurology

## 2017-05-26 ENCOUNTER — Other Ambulatory Visit: Payer: Self-pay | Admitting: Nurse Practitioner

## 2017-05-31 DIAGNOSIS — F331 Major depressive disorder, recurrent, moderate: Secondary | ICD-10-CM | POA: Diagnosis not present

## 2017-06-09 ENCOUNTER — Telehealth: Payer: Self-pay | Admitting: *Deleted

## 2017-06-09 ENCOUNTER — Encounter (INDEPENDENT_AMBULATORY_CARE_PROVIDER_SITE_OTHER): Payer: Self-pay

## 2017-06-09 ENCOUNTER — Ambulatory Visit (INDEPENDENT_AMBULATORY_CARE_PROVIDER_SITE_OTHER): Payer: Medicare Other | Admitting: Neurology

## 2017-06-09 ENCOUNTER — Encounter: Payer: Self-pay | Admitting: Neurology

## 2017-06-09 VITALS — BP 109/69 | HR 67 | Ht 69.5 in | Wt 207.0 lb

## 2017-06-09 DIAGNOSIS — F3160 Bipolar disorder, current episode mixed, unspecified: Secondary | ICD-10-CM

## 2017-06-09 DIAGNOSIS — R569 Unspecified convulsions: Secondary | ICD-10-CM

## 2017-06-09 MED ORDER — TOPIRAMATE 100 MG PO TABS: 200.0000 mg | ORAL_TABLET | Freq: Two times a day (BID) | ORAL | 4 refills | Status: DC

## 2017-06-09 MED ORDER — KEPPRA 500 MG PO TABS: 1500.0000 mg | ORAL_TABLET | Freq: Two times a day (BID) | ORAL | 4 refills | Status: DC

## 2017-06-09 MED ORDER — LACOSAMIDE 150 MG PO TABS: 2.0000 | ORAL_TABLET | Freq: Two times a day (BID) | ORAL | 4 refills | Status: DC

## 2017-06-09 NOTE — Telephone Encounter (Addendum)
Initiated PA, via phone, with [facility] [phone]).  Pt [id number].  PA approved through 06/08/2018.  IH#[id number].

## 2017-06-09 NOTE — Progress Notes (Signed)
[facility]  PATIENT: Andrew [name] DOB: November 28, 1970  HISTORY OF PRESENT ILLNESS:[name], 46 year old male returns for followup. He is with his mother.He has a history of intractable seizure disorder since 46 years old. He has been followed in the office since 1990. 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  He has had EMU monitoring at [facility] in the past that included abnormal baseline EEG due to intermittent predominantly right temporal spike, sharp right,and focal slowing. He continues to refuse surgery to remove irritable focus.  Over the years, he has tried different combination, he has been on current medications  Topamax 100 mg twice a day, +50 mg twice a day, Dilantin 100 mg 2 tablets twice a day, Keppra 500 mg 3 tablets b.i.d, brand name  He is also taking Buspirone 10 mg t.i.d., citalopram 20 mg every day for his depression and angry control prescribed by psychiatry.  He continues to have 2-3 seizures every day, complex partial with secondary generalization, he had burst open few helmet due to his seizure, he continued to smoke marijuana and cigarette. He helps his mother taking care of his elderly bed ridden grandmother at home, has raging spells sometimes.   Vimpat 150 twice a day was added since 2015,   He still has seizure almost daily,  He is now taking Topamax 100 plus 50 mg twice a day , Dilantin 200 mg twice a day, Keppra 500 mg 3 tablets twice a day, Vimpat 150 mg twice a day  UPDATE February 07 2017: He came here urgently in early February 2018 because bilateral hands shaking, he was given the prescription of Vraylar, his shaking has much improved after stop the medication.  He is going through dental procedure, his mother had POA, they agreed to proceed with VNS  UPDATE June 09 2017: His seizure overall has much improved, is no longer having a daily basis, is tolerating current medications, but yesterday on June 08 2017, he  had few recurrent seizures, he is still going through dental procedures,  He is taking Keppra 500 mg 3 tablets, Vimpat 150 mg 2 tablets, topiramate 100 mg 2 tablets twice a day  REVIEW OF SYSTEMS: Full 14 system review of systems performed and notable only for those listed, all others are neg:  Recurrent seizure   ALLERGIES: No Known Allergies  HOME MEDICATIONS: Outpatient Medications Prior to Visit  Medication Sig Dispense Refill  . busPIRone (BUSPAR) 15 MG tablet Take 15 mg by mouth 3 (three) times daily.     Marland Kitchen KEPPRA 500 MG tablet Take 3 tablets (1,500 mg total) by mouth 2 (two) times daily. take 3 tablets by mouth twice a day Brand Medically Necessary (Patient taking differently: Take 1,500 mg by mouth 2 (two) times daily. Brand Medically Necessary) 540 tablet 4  . topiramate (TOPAMAX) 100 MG tablet Take 2 tablets (200 mg total) by mouth 2 (two) times daily. 360 tablet 4  . VIMPAT 150 MG TABS take 2 tablets by mouth twice a day 360 tablet 1   No facility-administered medications prior to visit.     PAST MEDICAL HISTORY: Past Medical History:  Diagnosis Date  . Anxiety   . Depression   . Seizures ([place])     PAST SURGICAL HISTORY: Past Surgical History:  Procedure Laterality Date  . none      FAMILY HISTORY: Family History  Problem Relation Age of Onset  . Asthma Father     SOCIAL HISTORY: Social History   Social History  .  Marital status: Single    Spouse name: N/A  . Number of children: 0  . Years of education: 12   Occupational History  .      Disabled   Social History Main Topics  . Smoking status: Current Every Day Smoker    Packs/day: 0.50    Years: 20.00    Types: Cigarettes  . Smokeless tobacco: Never Used  . Alcohol use No  . Drug use: No     Comment: 11/08/16 - reports he has stopped smoking marijuana.  . Sexual activity: Not on file   Other Topics Concern  . Not on file   Social History Narrative   Patient lives at home with his mother  Andrew [name] ). Patient is disabled. Patient has high school education.   Left handed.   Caffeine- soda - pepsi four cans daily.   Patients father died of over dose.-40     PHYSICAL EXAM  Vitals:   06/09/17 1313  BP: 109/69  Pulse: 67  Weight: 207 lb (93.9 kg)  Height: 5' 9.5" (1.765 m)   Body mass index is 30.13 kg/m. Generalized: Well developed, in no acute distress  Head: normocephalic and atraumatic,. gingival hypertrophy, wearing helmet  Neck: Supple, no carotid bruits  Musculoskeletal: No deformity  Neurological examination  Mentation: Alert oriented to time, place, history taking. Follows most commands speech and language fluent  Cranial nerve II-XII: Pupils were equal round reactive to light extraocular movements were full, visual field were full on confrontational test. Facial sensation and strength were normal. hearing was intact to finger rubbing bilaterally. Uvula tongue midline. head turning and shoulder shrug were normal and symmetric.Tongue protrusion into cheek strength was normal.  Motor: normal bulk and tone, full strength in the BUE, BLE, fine finger movements normal, no pronator drift. No focal weakness  Coordination: finger-nose-finger, heel-to-shin bilaterally, no dysmetria  Reflexes: Brachioradialis 2/2, biceps 2/2, triceps 2/2, patellar 2/2, Achilles 2/2, plantar responses were flexor bilaterally.  Gait and Station: Rising up from seated position without assistance, narrow based, moderate stride, steady gait,  DIAGNOSTIC DATA (LABS, IMAGING, TESTING) -   ASSESSMENT AND PLAN 46 y.o. year old male  has a past medical history of  Intractable epilepsy Depression Keep Topamax to 100 mg 2 tablets twice a day Vimpat  150 mg 2 tablets twice a day Keppra 500 mg 3 tablets twice a day He was able to successfully tapering off Dilantin without significant side effect noticed,  He wants to hold off physical nerve stimulation, the other options are  Onif.  Andrew [name], M.D. Ph.D.  [facility] [place], [place] [address] Phone: [phone] Fax:      [phone]

## 2017-06-14 DIAGNOSIS — F0631 Mood disorder due to known physiological condition with depressive features: Secondary | ICD-10-CM | POA: Diagnosis not present

## 2017-06-16 DIAGNOSIS — F0631 Mood disorder due to known physiological condition with depressive features: Secondary | ICD-10-CM | POA: Diagnosis not present

## 2017-07-15 DIAGNOSIS — F419 Anxiety disorder, unspecified: Secondary | ICD-10-CM | POA: Diagnosis not present

## 2017-07-15 DIAGNOSIS — F0631 Mood disorder due to known physiological condition with depressive features: Secondary | ICD-10-CM | POA: Diagnosis not present

## 2017-08-12 DIAGNOSIS — F0631 Mood disorder due to known physiological condition with depressive features: Secondary | ICD-10-CM | POA: Diagnosis not present

## 2017-08-23 ENCOUNTER — Other Ambulatory Visit: Payer: Self-pay | Admitting: Nurse Practitioner

## 2017-09-09 ENCOUNTER — Telehealth: Payer: Self-pay | Admitting: Neurology

## 2017-09-09 NOTE — Telephone Encounter (Signed)
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 

## 2017-09-19 DIAGNOSIS — F0631 Mood disorder due to known physiological condition with depressive features: Secondary | ICD-10-CM | POA: Diagnosis not present

## 2017-10-05 DIAGNOSIS — F0631 Mood disorder due to known physiological condition with depressive features: Secondary | ICD-10-CM | POA: Diagnosis not present

## 2017-10-05 DIAGNOSIS — F419 Anxiety disorder, unspecified: Secondary | ICD-10-CM | POA: Diagnosis not present

## 2017-10-12 DIAGNOSIS — F0631 Mood disorder due to known physiological condition with depressive features: Secondary | ICD-10-CM | POA: Diagnosis not present

## 2017-11-09 DIAGNOSIS — F0631 Mood disorder due to known physiological condition with depressive features: Secondary | ICD-10-CM | POA: Diagnosis not present

## 2017-11-23 ENCOUNTER — Telehealth: Payer: Self-pay | Admitting: Neurology

## 2017-11-23 ENCOUNTER — Other Ambulatory Visit: Payer: Self-pay | Admitting: Nurse Practitioner

## 2017-11-23 MED ORDER — LACOSAMIDE 150 MG PO TABS: 2.0000 | ORAL_TABLET | Freq: Two times a day (BID) | ORAL | 1 refills | Status: DC

## 2017-11-23 NOTE — Addendum Note (Signed)
Addended by: Noberto Retort C on: 11/23/2017 05:03 PM   Modules accepted: Orders

## 2017-11-23 NOTE — Telephone Encounter (Signed)
Pt is needing a refill on Lacosamide (VIMPAT) 150 MG TABS Pt said that [facility] is waiting on [place] before they can give it to him but he is out today

## 2017-11-23 NOTE — Telephone Encounter (Signed)
Spoke to patient's mother - she is aware that we are working on a quantity override with his insurance company for this medication.

## 2017-11-23 NOTE — Addendum Note (Signed)
Addended by: Noberto Retort C on: 11/23/2017 05:00 PM   Modules accepted: Orders

## 2017-11-23 NOTE — Telephone Encounter (Signed)
Brand name Vimpat and quantity override approved by [facility] [id number]) from 11/22/17 through 11/23/2018. PT [id number].  Patient's mother and pharmacy notified of approvals.

## 2017-11-23 NOTE — Telephone Encounter (Signed)
Pt mother(on DPR thru [employer]) has called with concern to the refill request for the Vimpat still not being sent to pt's pharmacy yet  [place], [place] [phone] (Phone) [phone] (Fax)   Pt mother states this is needed for this afternoon, pt mother asking for a call if this can not be done for some reason.

## 2017-12-12 ENCOUNTER — Ambulatory Visit (INDEPENDENT_AMBULATORY_CARE_PROVIDER_SITE_OTHER): Payer: Medicare Other | Admitting: Neurology

## 2017-12-12 ENCOUNTER — Encounter: Payer: Self-pay | Admitting: Neurology

## 2017-12-12 VITALS — BP 132/102 | HR 79 | Ht 69.5 in | Wt 228.0 lb

## 2017-12-12 DIAGNOSIS — F3160 Bipolar disorder, current episode mixed, unspecified: Secondary | ICD-10-CM | POA: Diagnosis not present

## 2017-12-12 DIAGNOSIS — R569 Unspecified convulsions: Secondary | ICD-10-CM

## 2017-12-12 MED ORDER — LAMOTRIGINE 100 MG PO TABS: 200.0000 mg | ORAL_TABLET | Freq: Two times a day (BID) | ORAL | 11 refills | Status: DC

## 2017-12-12 MED ORDER — LAMOTRIGINE 25 MG PO TABS: ORAL_TABLET | ORAL | 0 refills | Status: DC

## 2017-12-12 NOTE — Patient Instructions (Signed)
Week Keppra 500mg  Lamotrigine 25mg  tab  1st week 3/3 1/1  2nd week 3/3 2/2  3rd week 2/3 3/3  4th week 2/2 4/4  5th week 1/1 Lamotrigine 100mg  2 tabs twice a day  6th week  0/0 Observe if there is any change in seizure frequency Lamotrigine  100mg  2 tabs/2tabs

## 2017-12-12 NOTE — Progress Notes (Signed)
[facility]  PATIENT: Andrew [name] DOB: 11/16/1971  HISTORY OF PRESENT ILLNESS:[name], 47 year old male returns for followup. He is with his mother.He has a history of intractable seizure disorder since 47 years old. He has been followed in the office since 1990. 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  He has had EMU monitoring at [facility] in the past that included abnormal baseline EEG due to intermittent predominantly right temporal spike, sharp right,and focal slowing. He continues to refuse surgery to remove irritable focus.  Over the years, he has tried different combination, he has been on current medications  Topamax 100 mg twice a day, +50 mg twice a day, Dilantin 100 mg 2 tablets twice a day, Keppra 500 mg 3 tablets b.i.d, brand name  He is also taking Buspirone 10 mg t.i.d., citalopram 20 mg every day for his depression and angry control prescribed by psychiatry.  He continues to have 2-3 seizures every day, complex partial with secondary generalization, he had burst open few helmet due to his seizure, he continued to smoke marijuana and cigarette. He helps his mother taking care of his elderly bed ridden grandmother at home, has raging spells sometimes.   Vimpat 150 twice a day was added since 2015,   He still has seizure almost daily,  He is now taking Topamax 100 plus 50 mg twice a day , Dilantin 200 mg twice a day, Keppra 500 mg 3 tablets twice a day, Vimpat 150 mg twice a day  UPDATE February 07 2017: He came here urgently in early February 2018 because bilateral hands shaking, he was given the prescription of Vraylar, his shaking has much improved after stop the medication.  He is going through dental procedure, his mother had POA, they agreed to proceed with VNS  UPDATE June 09 2017: His seizure overall has much improved, is no longer having a daily basis, is tolerating current medications, but yesterday on June 08 2017, he  had few recurrent seizures, he is still going through dental procedures,  He is taking Keppra 500 mg 3 tablets, Vimpat 150 mg 2 tablets, topiramate 100 mg 2 tablets twice a day  UPDATE Dec 12 2017: His seizure overall has improved some, he also goes to [facility], taking buspiprone, which has helped his mood some,  Continue have frequent migraine headaches, 3-4 times each week, sometimes multiple episode in the day,  He smokes cigarettes some, mother has convinced him to stop use marijuana.  He continue have significant depression, after discussed with mother and patient, we decided to switch Keppra to lamotrigine,  We also talked about VNS placement, he wants to hold off   REVIEW OF SYSTEMS: Full 14 system review of systems performed and notable only for those listed, all others are neg:  Recurrent seizure   ALLERGIES: No Known Allergies  HOME MEDICATIONS: Outpatient Medications Prior to Visit  Medication Sig Dispense Refill  . busPIRone (BUSPAR) 15 MG tablet Take 15 mg by mouth 3 (three) times daily.     Marland Kitchen KEPPRA 500 MG tablet Take 3 tablets (1,500 mg total) by mouth 2 (two) times daily. take 3 tablets by mouth twice a day Brand Medically Necessary 540 tablet 4  . Lacosamide (VIMPAT) 150 MG TABS Take 2 tablets (300 mg total) by mouth 2 (two) times daily. 360 tablet 1  . topiramate (TOPAMAX) 100 MG tablet Take 2 tablets (200 mg total) by mouth 2 (two) times daily. 360 tablet 4   No facility-administered medications  prior to visit.     PAST MEDICAL HISTORY: Past Medical History:  Diagnosis Date  . Anxiety   . Depression   . Seizures ([place])     PAST SURGICAL HISTORY: Past Surgical History:  Procedure Laterality Date  . none      FAMILY HISTORY: Family History  Problem Relation Age of Onset  . Asthma Father     SOCIAL HISTORY: Social History   Socioeconomic History  . Marital status: Single    Spouse name: Not on file  . Number of children: 0  .  Years of education: 38  . Highest education level: Not on file  Social Needs  . Financial resource strain: Not on file  . Food insecurity - worry: Not on file  . Food insecurity - inability: Not on file  . Transportation needs - medical: Not on file  . Transportation needs - non-medical: Not on file  Occupational History    Comment: Disabled  Tobacco Use  . Smoking status: Current Every Day Smoker    Packs/day: 0.50    Years: 20.00    Pack years: 10.00    Types: Cigarettes  . Smokeless tobacco: Never Used  Substance and Sexual Activity  . Alcohol use: No    Alcohol/week: 0.0 oz  . Drug use: No    Comment: 11/08/16 - reports he has stopped smoking marijuana.  . Sexual activity: Not on file  Other Topics Concern  . Not on file  Social History Narrative   Patient lives at home with his mother Andrew [name] ). Patient is disabled. Patient has high school education.   Left handed.   Caffeine- soda - pepsi four cans daily.   Patients father died of over dose.-40     PHYSICAL EXAM  Vitals:   12/12/17 1037  BP: (!) 132/102  Pulse: 79  Weight: 228 lb (103.4 kg)  Height: 5' 9.5" (1.765 m)   Body mass index is 33.19 kg/m. Generalized: Well developed, in no acute distress  Head: normocephalic and atraumatic,. gingival hypertrophy, wearing helmet  Neck: Supple, no carotid bruits  Musculoskeletal: No deformity  Neurological examination  Mentation: Alert oriented to time, place, history taking. Follows most commands speech and language fluent  Cranial nerve II-XII: Pupils were equal round reactive to light extraocular movements were full, visual field were full on confrontational test. Facial sensation and strength were normal. hearing was intact to finger rubbing bilaterally. Uvula tongue midline. head turning and shoulder shrug were normal and symmetric.Tongue protrusion into cheek strength was normal.  Motor: normal bulk and tone, full strength in the BUE, BLE, fine  finger movements normal, no pronator drift. No focal weakness  Coordination: finger-nose-finger, heel-to-shin bilaterally, no dysmetria  Reflexes: Brachioradialis 2/2, biceps 2/2, triceps 2/2, patellar 2/2, Achilles 2/2, plantar responses were flexor bilaterally.  Gait and Station: Rising up from seated position without assistance, narrow based, moderate stride, steady gait,  DIAGNOSTIC DATA (LABS, IMAGING, TESTING) -   ASSESSMENT AND PLAN 47 y.o. year old male  has a past medical history of  Intractable epilepsy Depression  Keep Topamax to 100 mg 2 tablets twice a day  Vimpat  150 mg 2 tablets twice a day  Tapering off Keppra 500 mg 3 tablets twice a day, switch to lamotrigine titrating to 100 mg 2 tablets twice a day  He does not want VNS at this point  Marcial Pacas, M.D. Ph.D.  [facility] [place], [place] [address] Phone: [phone] Fax:  [phone] 

## 2017-12-19 DIAGNOSIS — F419 Anxiety disorder, unspecified: Secondary | ICD-10-CM | POA: Diagnosis not present

## 2017-12-19 DIAGNOSIS — F0631 Mood disorder due to known physiological condition with depressive features: Secondary | ICD-10-CM | POA: Diagnosis not present

## 2018-03-20 ENCOUNTER — Encounter: Payer: Self-pay | Admitting: Neurology

## 2018-03-20 ENCOUNTER — Ambulatory Visit (INDEPENDENT_AMBULATORY_CARE_PROVIDER_SITE_OTHER): Payer: Medicare Other | Admitting: Neurology

## 2018-03-20 VITALS — BP 140/92 | HR 68 | Ht 69.5 in | Wt 237.5 lb

## 2018-03-20 DIAGNOSIS — G40209 Localization-related (focal) (partial) symptomatic epilepsy and epileptic syndromes with complex partial seizures, not intractable, without status epilepticus: Secondary | ICD-10-CM

## 2018-03-20 MED ORDER — LAMOTRIGINE 100 MG PO TABS: 300.0000 mg | ORAL_TABLET | Freq: Two times a day (BID) | ORAL | 11 refills | Status: DC

## 2018-03-20 MED ORDER — TOPIRAMATE 100 MG PO TABS: 100.0000 mg | ORAL_TABLET | Freq: Two times a day (BID) | ORAL | 4 refills | Status: DC

## 2018-03-20 NOTE — Progress Notes (Signed)
[facility]  PATIENT: Andrew [name] DOB: 1971-05-20  HISTORY OF PRESENT ILLNESS:Andrew [name], 47 year old male returns for followup. He is with his mother.He has a history of intractable seizure disorder since 47 years old. He has been followed in the office since 1990. 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  He has had EMU monitoring at [facility] in the past that included abnormal baseline EEG due to intermittent predominantly right temporal spike, sharp right,and focal slowing. He continues to refuse surgery to remove irritable focus.  Over the years, he has tried different combination, he has been on current medications  Topamax 100 mg twice a day, +50 mg twice a day, Dilantin 100 mg 2 tablets twice a day, Keppra 500 mg 3 tablets b.i.d, brand name  He is also taking Buspirone 10 mg t.i.d., citalopram 20 mg every day for his depression and angry control prescribed by psychiatry.  He continues to have 2-3 seizures every day, complex partial with secondary generalization, he had burst open few helmet due to his seizure, he continued to smoke marijuana and cigarette. He helps his mother taking care of his elderly bed ridden grandmother at home, has raging spells sometimes.   Vimpat 150 twice a day was added since 2015,   He still has seizure almost daily,  He is now taking Topamax 100 plus 50 mg twice a day , Dilantin 200 mg twice a day, Keppra 500 mg 3 tablets twice a day, Vimpat 150 mg twice a day  UPDATE February 07 2017: He came here urgently in early February 2018 because bilateral hands shaking, he was given the prescription of Vraylar, his shaking has much improved after stop the medication.  He is going through dental procedure, his mother had POA, they agreed to proceed with VNS  UPDATE June 09 2017: His seizure overall has much improved, is no longer having a daily basis, is tolerating current medications, but yesterday on June 08 2017, he  had few recurrent seizures, he is still going through dental procedures,  He is taking Keppra 500 mg 3 tablets, Vimpat 150 mg 2 tablets, topiramate 100 mg 2 tablets twice a day  UPDATE Dec 12 2017: His seizure overall has improved some, he also goes to [facility], taking buspiprone, which has helped his mood some,  Continue have frequent migraine headaches, 3-4 times each week, sometimes multiple episode in the day,  He smokes cigarettes some, mother has convinced him to stop use marijuana.  He continue have significant depression, after discussed with mother and patient, we decided to switch Keppra to lamotrigine,  We also talked about VNS placement, he wants to hold off   UPDATE March 20 2018: He is doing much better now with current combinations, lamotrigine 100 mg 2 tablets twice a day, Vimpat 150 mg 2 tablets twice a day, Topamax 100 mg 2 tablets twice a day  Only few recurrent seizure, much better than his previous baseline his mood has improved as well   REVIEW OF SYSTEMS: Full 14 system review of systems performed and notable only for those listed, all others are neg:  Recurrent seizure   ALLERGIES: No Known Allergies  HOME MEDICATIONS: Outpatient Medications Prior to Visit  Medication Sig Dispense Refill  . busPIRone (BUSPAR) 15 MG tablet Take 15 mg by mouth 3 (three) times daily.     . Lacosamide (VIMPAT) 150 MG TABS Take 2 tablets (300 mg total) by mouth 2 (two) times daily. 360 tablet 1  . lamoTRIgine (  LAMICTAL) 100 MG tablet Take 2 tablets (200 mg total) by mouth 2 (two) times daily. 120 tablet 11  . topiramate (TOPAMAX) 100 MG tablet Take 2 tablets (200 mg total) by mouth 2 (two) times daily. 360 tablet 4  . KEPPRA 500 MG tablet Take 3 tablets (1,500 mg total) by mouth 2 (two) times daily. take 3 tablets by mouth twice a day Brand Medically Necessary 540 tablet 4  . lamoTRIgine (LAMICTAL) 25 MG tablet 1 tablet twice a day for the first week 2 tablets  twice a day for the second week 3 tablets twice a day for the third week 4 tablets twice a day for the fourth week  For total of 140 tablets  After finish titration with small dose of lamotrigine 25 mg, change to lamotrigine 100 mg twice a day 140 tablet 0   No facility-administered medications prior to visit.     PAST MEDICAL HISTORY: Past Medical History:  Diagnosis Date  . Anxiety   . Depression   . Seizures ([place])     PAST SURGICAL HISTORY: Past Surgical History:  Procedure Laterality Date  . none      FAMILY HISTORY: Family History  Problem Relation Age of Onset  . Asthma Father     SOCIAL HISTORY: Social History   Socioeconomic History  . Marital status: Single    Spouse name: Not on file  . Number of children: 0  . Years of education: 55  . Highest education level: Not on file  Occupational History    Comment: Disabled  Social Needs  . Financial resource strain: Not on file  . Food insecurity:    Worry: Not on file    Inability: Not on file  . Transportation needs:    Medical: Not on file    Non-medical: Not on file  Tobacco Use  . Smoking status: Current Every Day Smoker    Packs/day: 0.50    Years: 20.00    Pack years: 10.00    Types: Cigarettes  . Smokeless tobacco: Never Used  Substance and Sexual Activity  . Alcohol use: No    Alcohol/week: 0.0 oz  . Drug use: No    Types: Marijuana    Comment: 11/08/16 - reports he has stopped smoking marijuana.  . Sexual activity: Not on file  Lifestyle  . Physical activity:    Days per week: Not on file    Minutes per session: Not on file  . Stress: Not on file  Relationships  . Social connections:    Talks on phone: Not on file    Gets together: Not on file    Attends religious service: Not on file    Active member of club or organization: Not on file    Attends meetings of clubs or organizations: Not on file    Relationship status: Not on file  . Intimate partner violence:    Fear of  current or ex partner: Not on file    Emotionally abused: Not on file    Physically abused: Not on file    Forced sexual activity: Not on file  Other Topics Concern  . Not on file  Social History Narrative   Patient lives at home with his mother Admiral Marcucci ). Patient is disabled. Patient has high school education.   Left handed.   Caffeine- soda - pepsi four cans daily.   Patients father died of over dose.-40     PHYSICAL EXAM  Vitals:   03/20/18 0919  BP: (!) 140/92  Pulse: 68  Weight: 237 lb 8 oz (107.7 kg)  Height: 5' 9.5" (1.765 m)   Body mass index is 34.57 kg/m. Generalized: Well developed, in no acute distress  Head: normocephalic and atraumatic,. gingival hypertrophy, wearing helmet  Neck: Supple, no carotid bruits  Musculoskeletal: No deformity  Neurological examination  Mentation: Alert oriented to time, place, history taking. Follows most commands speech and language fluent  Cranial nerve II-XII: Pupils were equal round reactive to light extraocular movements were full, visual field were full on confrontational test. Facial sensation and strength were normal. hearing was intact to finger rubbing bilaterally. Uvula tongue midline. head turning and shoulder shrug were normal and symmetric.Tongue protrusion into cheek strength was normal.  Motor: normal bulk and tone, full strength in the BUE, BLE, fine finger movements normal, no pronator drift. No focal weakness  Coordination: finger-nose-finger, heel-to-shin bilaterally, no dysmetria  Reflexes: Brachioradialis 2/2, biceps 2/2, triceps 2/2, patellar 2/2, Achilles 2/2, plantar responses were flexor bilaterally.  Gait and Station: Rising up from seated position without assistance, narrow based, moderate stride, steady gait,  DIAGNOSTIC DATA (LABS, IMAGING, TESTING) -   ASSESSMENT AND PLAN 47 y.o. year old male  has a past medical history of  Intractable epilepsy Depression  Decreased Topamax to 100 mg  1 tablet twice a day  Keep Vimpat  150 mg 2 tablets twice a day  Continue to increase lamotrigine titrating to 100 mg 3 tablets twice a day  He does not want VNS at this point  Recurrent spells of seizure, and noticeable transient confusion during conversation, will proceed with EEG   Marcial Pacas, M.D. Ph.D.  [facility] [place], [place] [address] Phone: [phone] Fax:      [phone]

## 2018-03-21 ENCOUNTER — Ambulatory Visit (INDEPENDENT_AMBULATORY_CARE_PROVIDER_SITE_OTHER): Payer: Medicare Other | Admitting: Neurology

## 2018-03-21 DIAGNOSIS — G40209 Localization-related (focal) (partial) symptomatic epilepsy and epileptic syndromes with complex partial seizures, not intractable, without status epilepticus: Secondary | ICD-10-CM | POA: Diagnosis not present

## 2018-03-22 DIAGNOSIS — F0631 Mood disorder due to known physiological condition with depressive features: Secondary | ICD-10-CM | POA: Diagnosis not present

## 2018-03-27 NOTE — Procedures (Addendum)
   HISTORY: 47 year old male, had a history of intractable seizure, was developmentally delayed.  TECHNIQUE:  16 channel EEG was performed based on standard 10-16 international system. One channel was dedicated to EKG, which has demonstrates normal sinus rhythm of 60 beats per minutes.  Upon awakening, the posterior background activity low amplitude, distal rhythmic, with mixed alpha and theta range activity reactive to eye opening and closure.  There was occasionally short protrusion of generalized spike slow waves,  Photic stimulation was performed, which induced a symmetric photic driving.  Hyperventilation was performed, there was increased frequency of abnormal discharge, sharp protrusion of generalized spike slow following photic stimulation or hyperventilation.  No sleep was achieved.  CONCLUSION: This is an abnormal EEG,.  There is electrodiagnostic evidence of generalized epileptiform discharge in the background of mildly dysrhythmic slow background activity.  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  Marcial Pacas, M.D. Ph.D.  [facility] [place], [place] [address] Phone: [phone] Fax:      [phone]

## 2018-03-30 ENCOUNTER — Telehealth: Payer: Self-pay | Admitting: Neurology

## 2018-03-30 NOTE — Telephone Encounter (Signed)
Left message requesting a return call.

## 2018-03-30 NOTE — Telephone Encounter (Signed)
Please call patient, EEG showed evidence of generalized epileptiform discharge,  He is to continue medication regime from last visit

## 2018-03-30 NOTE — Telephone Encounter (Addendum)
Left patient's mother a detailed message, with EEG results and to continue meds, on voicemail (ok per DPR).  Provided our number to call back with any questions.

## 2018-03-30 NOTE — Telephone Encounter (Signed)
Pt's mother called said they have been weaning him off topamax and increasing lamotrigine as directed. She states this is the 2nd week of taking topamax 1am and 1 pm and lamotrogone 3am and 3 pm. Tuesday night and last night he got dizzy and uncorrdinated after the evening dose for about 1 hour the he gets better. He has been ok after the am doses for the past 3 mornings.  Please call to advise

## 2018-03-30 NOTE — Progress Notes (Signed)
Correction:  This is an abnormal EEG.  There is   electrodiagnostic evidence of generalized epileptiform discharge in the background of mildly dysrhythmic slow background activity.  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

## 2018-03-30 NOTE — Telephone Encounter (Signed)
Spoke to his mother, he just increase his Lamictal to 300mg  BID two days ago.  Upon this increase, he has a short spell of dizziness that resolved within the hour.  He is doing better now.  His mother wanted Dr. Krista Blue to have this information.  She is also requesting EEG results.

## 2018-04-13 ENCOUNTER — Other Ambulatory Visit: Payer: Medicare Other

## 2018-07-20 ENCOUNTER — Ambulatory Visit (INDEPENDENT_AMBULATORY_CARE_PROVIDER_SITE_OTHER): Payer: Medicare Other | Admitting: Neurology

## 2018-07-20 ENCOUNTER — Encounter: Payer: Self-pay | Admitting: Neurology

## 2018-07-20 ENCOUNTER — Telehealth: Payer: Self-pay | Admitting: *Deleted

## 2018-07-20 ENCOUNTER — Other Ambulatory Visit: Payer: Self-pay | Admitting: *Deleted

## 2018-07-20 VITALS — BP 124/90 | HR 65 | Ht 69.5 in | Wt 231.0 lb

## 2018-07-20 DIAGNOSIS — G40209 Localization-related (focal) (partial) symptomatic epilepsy and epileptic syndromes with complex partial seizures, not intractable, without status epilepticus: Secondary | ICD-10-CM

## 2018-07-20 MED ORDER — LAMOTRIGINE ER 300 MG PO TB24: 600.0000 mg | ORAL_TABLET | Freq: Every day | ORAL | 4 refills | Status: DC

## 2018-07-20 MED ORDER — LACOSAMIDE 200 MG PO TABS: 400.0000 mg | ORAL_TABLET | Freq: Two times a day (BID) | ORAL | 4 refills | Status: DC

## 2018-07-20 NOTE — Telephone Encounter (Addendum)
PA approved, for Vimpat, by [employer] [id number]) through 07/20/2019.  Pt [id number].  Completed through covermymeds - [id number].

## 2018-07-20 NOTE — Progress Notes (Signed)
[facility]  PATIENT: Andrew [name] DOB: 10-Feb-1971  HISTORY OF PRESENT ILLNESS:[name], 47 year old male returns for followup. He is with his mother.He has a history of intractable seizure disorder since 47 years old. He has been followed in the office since 1990. 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  He has had EMU monitoring at [facility] in the past that included abnormal baseline EEG due to intermittent predominantly right temporal spike, sharp right,and focal slowing. He continues to refuse surgery to remove irritable focus.  Over the years, he has tried different combination, he has been on current medications  Topamax 100 mg twice a day, +50 mg twice a day, Dilantin 100 mg 2 tablets twice a day, Keppra 500 mg 3 tablets b.i.d, brand name  He is also taking Buspirone 10 mg t.i.d., citalopram 20 mg every day for his depression and angry control prescribed by psychiatry.  He continues to have 2-3 seizures every day, complex partial with secondary generalization, he had burst open few helmet due to his seizure, he continued to smoke marijuana and cigarette. He helps his mother taking care of his elderly bed ridden grandmother at home, has raging spells sometimes.   Vimpat 150 twice a day was added since 2015,   He still has seizure almost daily,  He is now taking Topamax 100 plus 50 mg twice a day , Dilantin 200 mg twice a day, Keppra 500 mg 3 tablets twice a day, Vimpat 150 mg twice a day  UPDATE February 07 2017: He came here urgently in early February 2018 because bilateral hands shaking, he was given the prescription of Vraylar, his shaking has much improved after stop the medication.  He is going through dental procedure, his mother had POA, they agreed to proceed with VNS  UPDATE June 09 2017: His seizure overall has much improved, is no longer having a daily basis, is tolerating current medications, but yesterday on June 08 2017, he  had few recurrent seizures, he is still going through dental procedures,  He is taking Keppra 500 mg 3 tablets, Vimpat 150 mg 2 tablets, topiramate 100 mg 2 tablets twice a day  UPDATE Dec 12 2017: His seizure overall has improved some, he also goes to [facility], taking buspiprone, which has helped his mood some,  Continue have frequent migraine headaches, 3-4 times each week, sometimes multiple episode in the day,  He smokes cigarettes some, mother has convinced him to stop use marijuana.  He continue have significant depression, after discussed with mother and patient, we decided to switch Keppra to lamotrigine,  We also talked about VNS placement, he wants to hold off   UPDATE March 20 2018: He is doing much better now with current combinations, lamotrigine 100 mg 2 tablets twice a day, Vimpat 150 mg 2 tablets twice a day, Topamax 100 mg 2 tablets twice a day  Only few recurrent seizure, much better than his previous baseline his mood has improved as well  UPDATE July 20 2018: He is doing much better now with current combination of Vimpat 150 mg 2 tablets twice a day, lamotrigine 100 mg 3 tablets twice a day, Topamax 100 mg twice a day, he only has seizure once or twice each week, they are short lasting, sometimes without recurrent seizure in 1 week, this is in contrast to previous daily multiple seizure episode,  But he complains of dizziness, lightheadedness after each dose of medications,  His depression has much improved too, is more  compliant with his mother  EEG in April 2019 showed generalized epileptiform discharge in the background of mild dysrhythmic slowing.  REVIEW OF SYSTEMS: Full 14 system review of systems performed and notable only for those listed, all others are neg:  Recurrent seizure   ALLERGIES: No Known Allergies  HOME MEDICATIONS: Outpatient Medications Prior to Visit  Medication Sig Dispense Refill  . busPIRone (BUSPAR) 15 MG tablet  Take 15 mg by mouth 3 (three) times daily.     . Lacosamide (VIMPAT) 150 MG TABS Take 2 tablets (300 mg total) by mouth 2 (two) times daily. 360 tablet 1  . lamoTRIgine (LAMICTAL) 100 MG tablet Take 3 tablets (300 mg total) by mouth 2 (two) times daily. 120 tablet 11  . topiramate (TOPAMAX) 100 MG tablet Take 1 tablet (100 mg total) by mouth 2 (two) times daily. 360 tablet 4   No facility-administered medications prior to visit.     PAST MEDICAL HISTORY: Past Medical History:  Diagnosis Date  . Anxiety   . Depression   . Seizures ([place])     PAST SURGICAL HISTORY: Past Surgical History:  Procedure Laterality Date  . none      FAMILY HISTORY: Family History  Problem Relation Age of Onset  . Asthma Father     SOCIAL HISTORY: Social History   Socioeconomic History  . Marital status: Single    Spouse name: Not on file  . Number of children: 0  . Years of education: 34  . Highest education level: Not on file  Occupational History    Comment: Disabled  Social Needs  . Financial resource strain: Not on file  . Food insecurity:    Worry: Not on file    Inability: Not on file  . Transportation needs:    Medical: Not on file    Non-medical: Not on file  Tobacco Use  . Smoking status: Current Every Day Smoker    Packs/day: 0.50    Years: 20.00    Pack years: 10.00    Types: Cigarettes  . Smokeless tobacco: Never Used  Substance and Sexual Activity  . Alcohol use: No    Alcohol/week: 0.0 standard drinks  . Drug use: No    Types: Marijuana    Comment: 11/08/16 - reports he has stopped smoking marijuana.  . Sexual activity: Not on file  Lifestyle  . Physical activity:    Days per week: Not on file    Minutes per session: Not on file  . Stress: Not on file  Relationships  . Social connections:    Talks on phone: Not on file    Gets together: Not on file    Attends religious service: Not on file    Active member of club or organization: Not on file    Attends  meetings of clubs or organizations: Not on file    Relationship status: Not on file  . Intimate partner violence:    Fear of current or ex partner: Not on file    Emotionally abused: Not on file    Physically abused: Not on file    Forced sexual activity: Not on file  Other Topics Concern  . Not on file  Social History Narrative   Patient lives at home with his mother Arjun Hard ). Patient is disabled. Patient has high school education.   Left handed.   Caffeine- soda - pepsi four cans daily.   Patients father died of over dose.-40     PHYSICAL EXAM  Vitals:   07/20/18 1446  BP: 124/90  Pulse: 65  Weight: 231 lb (104.8 kg)  Height: 5' 9.5" (1.765 m)   Body mass index is 33.62 kg/m. Generalized: Well developed, in no acute distress, wear helmet Head: normocephalic and atraumatic,. gingival hypertrophy, wearing helmet  Neck: Supple, no carotid bruits  Musculoskeletal: No deformity  Neurological examination  Mentation: Alert oriented to time, place, history taking. Follows most commands speech and language fluent  Cranial nerve II-XII: Pupils were equal round reactive to light extraocular movements were full, visual field were full on confrontational test. Facial sensation and strength were normal. hearing was intact to finger rubbing bilaterally. Uvula tongue midline. head turning and shoulder shrug were normal and symmetric.Tongue protrusion into cheek strength was normal.  Motor: normal bulk and tone, full strength in the BUE, BLE,  Coordination: finger-nose-finger, heel-to-shin bilaterally, no dysmetria  Reflexes: Brachioradialis 2/2, biceps 2/2, triceps 2/2, patellar 2/2, Achilles 2/2, plantar responses were flexor bilaterally.  Gait and Station: Rising up from seated position without assistance,  steady gait,  DIAGNOSTIC DATA (LABS, IMAGING, TESTING) -   ASSESSMENT AND PLAN 47 y.o. year old male  has a past medical history of  Intractable epilepsy  Change  lamotrigine to extended release 300mg  2 tabs every night.  Taper off Topamax to decrease to polypharmacy side effect  Increase Vimpat to 200 mg 2 tablets twice a day,  Is overall doing very well does not want to consider vagus nerve stimulation at this point    Marcial Pacas, M.D. Ph.D.  [facility] [place], [place] [address] Phone: [phone] Fax:      [phone]

## 2018-07-25 ENCOUNTER — Telehealth: Payer: Self-pay | Admitting: *Deleted

## 2018-07-25 NOTE — Telephone Encounter (Signed)
PA approved, for lamotrigine ER, by [employer] [id number]) through 07/25/2019.  Pt [id number].  Completed through covermymeds - key: [id number].

## 2018-08-14 ENCOUNTER — Other Ambulatory Visit: Payer: Self-pay | Admitting: Neurology

## 2018-09-04 ENCOUNTER — Telehealth: Payer: Self-pay | Admitting: Neurology

## 2018-09-04 NOTE — Telephone Encounter (Signed)
Patient has been on Vimpat for years. Do you think the increase in medication would be causing these side effects or do you think this is unrelated?

## 2018-09-04 NOTE — Telephone Encounter (Signed)
I do not think his symptoms are related to his antiepileptic medications,  What is the location of numbness, limited to his right arm only, he has neck pain, which finger does numbness involve, as he has trouble using his right arm   May contact his pcp if his blood pressure remains elevated

## 2018-09-04 NOTE — Telephone Encounter (Signed)
Pts mother Lelan Pons called stating that today the pts right arm has been numb throughout the day,  has had a high blood pressure throughout the day, and a headache. Requesting a call to discuss feels this is in regards to lacosamide (VIMPAT) 200 MG TABS tablet

## 2018-09-05 NOTE — Telephone Encounter (Signed)
I called and spoke with patient's mother and she says he's doing a little better today. I advised her of Dr. Rhea Belton reply. She says that he's left handed but ever since increasing the vimpat, he occasionally has some pain and tingling in his right arm, but not consistently. She says he's had a slight headache and his BP is usually really good. I asked if he had been eating anything high in sodium lately and she admits that he's been eating a lot of potato chips and soups. I advised her that too much sodium could raise his BP and it could possibly be causing his mild headache. She agreed to help modify his diet and will contact PCP if it continues to stay elevated. She was very appreciative and will call us again if his arm pain and headaches persist or worsen.

## 2018-09-24 ENCOUNTER — Encounter (HOSPITAL_COMMUNITY): Payer: Self-pay

## 2018-09-24 ENCOUNTER — Emergency Department (HOSPITAL_COMMUNITY): Payer: Medicare Other

## 2018-09-24 ENCOUNTER — Emergency Department (HOSPITAL_COMMUNITY)
Admission: EM | Admit: 2018-09-24 | Discharge: 2018-09-24 | Disposition: A | Discharge status: Home / Self Care | Payer: Medicare Other | Attending: Emergency Medicine | Admitting: Emergency Medicine

## 2018-09-24 DIAGNOSIS — Z79899 Other long term (current) drug therapy: Secondary | ICD-10-CM | POA: Insufficient documentation

## 2018-09-24 DIAGNOSIS — S4992XA Unspecified injury of left shoulder and upper arm, initial encounter: Secondary | ICD-10-CM | POA: Diagnosis not present

## 2018-09-24 DIAGNOSIS — X58XXXA Exposure to other specified factors, initial encounter: Secondary | ICD-10-CM | POA: Insufficient documentation

## 2018-09-24 DIAGNOSIS — R52 Pain, unspecified: Secondary | ICD-10-CM | POA: Diagnosis not present

## 2018-09-24 DIAGNOSIS — S0990XA Unspecified injury of head, initial encounter: Secondary | ICD-10-CM | POA: Diagnosis not present

## 2018-09-24 DIAGNOSIS — M25519 Pain in unspecified shoulder: Secondary | ICD-10-CM | POA: Diagnosis not present

## 2018-09-24 DIAGNOSIS — Y999 Unspecified external cause status: Secondary | ICD-10-CM | POA: Diagnosis not present

## 2018-09-24 DIAGNOSIS — Y939 Activity, unspecified: Secondary | ICD-10-CM | POA: Diagnosis not present

## 2018-09-24 DIAGNOSIS — S0993XA Unspecified injury of face, initial encounter: Secondary | ICD-10-CM | POA: Diagnosis not present

## 2018-09-24 DIAGNOSIS — Y929 Unspecified place or not applicable: Secondary | ICD-10-CM | POA: Insufficient documentation

## 2018-09-24 DIAGNOSIS — M6281 Muscle weakness (generalized): Secondary | ICD-10-CM | POA: Diagnosis not present

## 2018-09-24 DIAGNOSIS — G959 Disease of spinal cord, unspecified: Secondary | ICD-10-CM | POA: Insufficient documentation

## 2018-09-24 DIAGNOSIS — G40909 Epilepsy, unspecified, not intractable, without status epilepticus: Secondary | ICD-10-CM | POA: Diagnosis not present

## 2018-09-24 DIAGNOSIS — S0083XA Contusion of other part of head, initial encounter: Secondary | ICD-10-CM | POA: Insufficient documentation

## 2018-09-24 DIAGNOSIS — M25512 Pain in left shoulder: Secondary | ICD-10-CM | POA: Diagnosis not present

## 2018-09-24 DIAGNOSIS — M5030 Other cervical disc degeneration, unspecified cervical region: Secondary | ICD-10-CM | POA: Diagnosis not present

## 2018-09-24 DIAGNOSIS — S199XXA Unspecified injury of neck, initial encounter: Secondary | ICD-10-CM | POA: Diagnosis not present

## 2018-09-24 DIAGNOSIS — F17210 Nicotine dependence, cigarettes, uncomplicated: Secondary | ICD-10-CM | POA: Insufficient documentation

## 2018-09-24 DIAGNOSIS — S299XXA Unspecified injury of thorax, initial encounter: Secondary | ICD-10-CM | POA: Diagnosis not present

## 2018-09-24 DIAGNOSIS — W19XXXA Unspecified fall, initial encounter: Secondary | ICD-10-CM | POA: Diagnosis not present

## 2018-09-24 DIAGNOSIS — R202 Paresthesia of skin: Secondary | ICD-10-CM | POA: Diagnosis not present

## 2018-09-24 DIAGNOSIS — S0003XA Contusion of scalp, initial encounter: Secondary | ICD-10-CM | POA: Insufficient documentation

## 2018-09-24 DIAGNOSIS — R569 Unspecified convulsions: Secondary | ICD-10-CM

## 2018-09-24 LAB — CBC
HCT: 48.7 % (ref 39.0–52.0)
Hemoglobin: 15 g/dL (ref 13.0–17.0)
MCH: 27 pg (ref 26.0–34.0)
MCHC: 30.8 g/dL (ref 30.0–36.0)
MCV: 87.7 fL (ref 80.0–100.0)
PLATELETS: 262 10*3/uL (ref 150–400)
RBC: 5.55 MIL/uL (ref 4.22–5.81)
RDW: 13.2 % (ref 11.5–15.5)
WBC: 13.5 10*3/uL — AB (ref 4.0–10.5)
nRBC: 0 % (ref 0.0–0.2)

## 2018-09-24 LAB — COMPREHENSIVE METABOLIC PANEL
ALBUMIN: 3.7 g/dL (ref 3.5–5.0)
ALT: 33 U/L (ref 0–44)
AST: 28 U/L (ref 15–41)
Alkaline Phosphatase: 69 U/L (ref 38–126)
Anion gap: 7 (ref 5–15)
BUN: 12 mg/dL (ref 6–20)
CHLORIDE: 104 mmol/L (ref 98–111)
CO2: 25 mmol/L (ref 22–32)
Calcium: 9.6 mg/dL (ref 8.9–10.3)
Creatinine, Ser: 1.2 mg/dL (ref 0.61–1.24)
GFR calc Af Amer: >60 mL/min (ref >60)
GFR calc non Af Amer: >60 mL/min (ref >60)
GLUCOSE: 107 mg/dL — AB (ref 70–99)
POTASSIUM: 3.9 mmol/L (ref 3.5–5.1)
Sodium: 136 mmol/L (ref 135–145)
Total Bilirubin: 0.4 mg/dL (ref 0.3–1.2)
Total Protein: 7 g/dL (ref 6.5–8.1)

## 2018-09-24 LAB — I-STAT CHEM 8, ED
BUN: 13 mg/dL (ref 6–20)
CALCIUM ION: 1.18 mmol/L (ref 1.15–1.40)
CREATININE: 1.2 mg/dL (ref 0.61–1.24)
Chloride: 106 mmol/L (ref 98–111)
Glucose, Bld: 107 mg/dL — ABNORMAL HIGH (ref 70–99)
HEMATOCRIT: 46 % (ref 39.0–52.0)
Hemoglobin: 15.6 g/dL (ref 13.0–17.0)
Potassium: 3.9 mmol/L (ref 3.5–5.1)
SODIUM: 140 mmol/L (ref 135–145)
TCO2: 24 mmol/L (ref 22–32)

## 2018-09-24 MED ORDER — GADOBUTROL 1 MMOL/ML IV SOLN
10.0000 mL | Freq: Once | INTRAVENOUS | Status: AC | PRN
  Administered 2018-09-24: 10 mL via INTRAVENOUS

## 2018-09-24 NOTE — ED Notes (Signed)
Family at bedside. 

## 2018-09-24 NOTE — ED Triage Notes (Signed)
Pt comes from home via [facility] post unwitnessed fall, hx of seizures. Abrasion to L cheek and L shoulder. C/o neck and back pain and R arm numbness that has been there for 2 weeks and worse tonight.

## 2018-09-24 NOTE — ED Provider Notes (Signed)
Assumed care at 0730 from Dr. Sabra Heck.  Pt's MRI shows no signs of acute disease or demyelinating disease or stroke.  Pt does have extensive cervical spine disease and sx may be coming from radiculopathy.  He will need to f/u as an outpt with neurology for an EMG.  Findings discussed with the pt and his mother and he was d/ced home.   Blanchie Dessert, MD 09/24/18 [phone]

## 2018-09-24 NOTE — ED Provider Notes (Signed)
[facility] EMERGENCY DEPARTMENT Provider Note   CSN: [id number] Arrival date & time: 09/24/18  0118     History   Chief Complaint Chief Complaint  Patient presents with  . Fall    HPI NIKO PENSON is a 47 y.o. male.  HPI  The patient is a 47 year old male, followed very closely by neurology because of recurrent seizures and a history of epilepsy, baseline for this patient is to have approximately 3-4 seizures per day however recently he had medication changes, currently taking Vimpat and  Past Medical History:  Diagnosis Date  . Anxiety   . Depression   . Seizures [facility])     Patient Active Problem List   Diagnosis Date Noted  . Partial symptomatic epilepsy with complex partial seizures, not intractable, without status epilepticus ([place]) 12/24/2016  . Bipolar disorder ([place]) 12/24/2016  . Seizures ([place])   . Anxiety     Past Surgical History:  Procedure Laterality Date  . none          Home Medications    Prior to Admission medications   Medication Sig Start Date End Date Taking? Authorizing Provider  busPIRone (BUSPAR) 15 MG tablet Take 15 mg by mouth 3 (three) times daily.    Yes [provider]  lacosamide (VIMPAT) 200 MG TABS tablet Take 2 tablets (400 mg total) by mouth 2 (two) times daily. 07/20/18  Yes Marcial Pacas, MD  LamoTRIgine 300 MG TB24 24 hour tablet Take 2 tablets (600 mg total) by mouth at bedtime. 07/20/18  Yes Marcial Pacas, MD    Family History Family History  Problem Relation Age of Onset  . Asthma Father     Social History Social History   Tobacco Use  . Smoking status: Current Every Day Smoker    Packs/day: 0.50    Years: 20.00    Pack years: 10.00    Types: Cigarettes  . Smokeless tobacco: Never Used  Substance Use Topics  . Alcohol use: No    Alcohol/week: 0.0 standard drinks  . Drug use: No    Types: Marijuana    Comment: 11/08/16 - reports he has stopped smoking marijuana.     Allergies     Patient has no known allergies.   Review of Systems Review of Systems  All other systems reviewed and are negative.    Physical Exam Updated Vital Signs BP (!) 177/108   Pulse 80   Temp 97.8 F (36.6 C)   Resp 20   SpO2 98%   Physical Exam  Constitutional: He appears well-developed and well-nourished. No distress.  HENT:  Head: Normocephalic.  Mouth/Throat: Oropharynx is clear and moist. No oropharyngeal exudate.  Contusion to the left periorbital face and scalp and chin, no malocclusion or hemotympanum  Eyes: Pupils are equal, round, and reactive to light. Conjunctivae and EOM are normal. Right eye exhibits no discharge. Left eye exhibits no discharge. No scleral icterus.  Neck: Normal range of motion. Neck supple. No JVD present. No thyromegaly present.  Cardiovascular: Regular rhythm, normal heart sounds and intact distal pulses. Exam reveals no gallop and no friction rub.  No murmur heard. Mild tachycardia  Pulmonary/Chest: Effort normal and breath sounds normal. No respiratory distress. He has no wheezes. He has no rales. He exhibits tenderness ( Some tenderness over the right chest).  Abdominal: Soft. Bowel sounds are normal. He exhibits no distension and no mass. There is no tenderness.  Musculoskeletal: Normal range of motion. He exhibits tenderness ( Tenderness  over the left shoulder with some redness). He exhibits no edema.  Lymphadenopathy:    He has no cervical adenopathy.  Neurological: He is alert. Coordination normal.  Patient is able to follow commands normally with his left arm and leg.  His speech is clear, he answers occasional questions, his right arm is weak but according to the mother this is chronically weak, his right leg is also weak however he has been ambulating on it today.  He is able to straight leg raise but with some difficulty.  Skin: Skin is warm and dry. No rash noted. No erythema.  Abrasion to the skin as noted above  Nursing note and vitals  reviewed.    ED Treatments / Results  Labs (all labs ordered are listed, but only abnormal results are displayed) Labs Reviewed  CBC - Abnormal; Notable for the following components:      Result Value   WBC 13.5 (*)    All other components within normal limits  COMPREHENSIVE METABOLIC PANEL - Abnormal; Notable for the following components:   Glucose, Bld 107 (*)    All other components within normal limits  I-STAT CHEM 8, ED - Abnormal; Notable for the following components:   Glucose, Bld 107 (*)    All other components within normal limits    EKG None  Radiology Dg Chest 1 View  Result Date: 09/24/2018 CLINICAL DATA:  47 year old male with fall. EXAM: CHEST  1 VIEW COMPARISON:  Chest radiograph dated 10/03/2013 FINDINGS: There is shallow inspiration. Mild cardiomegaly with mild vascular congestion. No focal consolidation, pleural effusion, or pneumothorax. No acute osseous pathology. IMPRESSION: Mild cardiomegaly with mild vascular congestion. No focal consolidation. Electronically Signed   By: Anner Crete M.D.   On: 09/24/2018 02:55   Ct Head Wo Contrast  Result Date: 09/24/2018 CLINICAL DATA:  47 year old male with trauma. EXAM: CT HEAD WITHOUT CONTRAST CT MAXILLOFACIAL WITHOUT CONTRAST CT CERVICAL SPINE WITHOUT CONTRAST TECHNIQUE: Multidetector CT imaging of the head, cervical spine, and maxillofacial structures were performed using the standard protocol without intravenous contrast. Multiplanar CT image reconstructions of the cervical spine and maxillofacial structures were also generated. COMPARISON:  Head CT dated 12/22/2016 FINDINGS: CT HEAD FINDINGS Brain: The ventricles and sulci appropriate size for patient's age. The gray-white matter discrimination is preserved. There is no acute intracranial hemorrhage. No mass effect or midline shift. No extra-axial fluid collection. Vascular: No hyperdense vessel or unexpected calcification. Skull: Normal. Negative for fracture or  focal lesion. Other: None CT MAXILLOFACIAL FINDINGS Osseous: No acute fracture or dislocation. Orbits: Negative. No traumatic or inflammatory finding. Sinuses: Mild diffuse mucoperiosteal thickening of paranasal sinuses. The mastoid air cells are clear. Soft tissues: None CT CERVICAL SPINE FINDINGS Alignment: No acute subluxation. Straightening of normal cervical lordosis likely related to degenerative changes. Skull base and vertebrae: No acute fracture Soft tissues and spinal canal: No prevertebral fluid or swelling. No visible canal hematoma. Disc levels:  Multilevel degenerative changes with osteophyte. Upper chest: Negative. Other: None IMPRESSION: 1. No acute intracranial pathology. 2. No acute facial bone fractures. 3. No acute/traumatic cervical spine pathology. Electronically Signed   By: Anner Crete M.D.   On: 09/24/2018 03:13   Ct Cervical Spine Wo Contrast  Result Date: 09/24/2018 CLINICAL DATA:  47 year old male with trauma. EXAM: CT HEAD WITHOUT CONTRAST CT MAXILLOFACIAL WITHOUT CONTRAST CT CERVICAL SPINE WITHOUT CONTRAST TECHNIQUE: Multidetector CT imaging of the head, cervical spine, and maxillofacial structures were performed using the standard protocol without intravenous contrast. Multiplanar CT  image reconstructions of the cervical spine and maxillofacial structures were also generated. COMPARISON:  Head CT dated 12/22/2016 FINDINGS: CT HEAD FINDINGS Brain: The ventricles and sulci appropriate size for patient's age. The gray-white matter discrimination is preserved. There is no acute intracranial hemorrhage. No mass effect or midline shift. No extra-axial fluid collection. Vascular: No hyperdense vessel or unexpected calcification. Skull: Normal. Negative for fracture or focal lesion. Other: None CT MAXILLOFACIAL FINDINGS Osseous: No acute fracture or dislocation. Orbits: Negative. No traumatic or inflammatory finding. Sinuses: Mild diffuse mucoperiosteal thickening of paranasal  sinuses. The mastoid air cells are clear. Soft tissues: None CT CERVICAL SPINE FINDINGS Alignment: No acute subluxation. Straightening of normal cervical lordosis likely related to degenerative changes. Skull base and vertebrae: No acute fracture Soft tissues and spinal canal: No prevertebral fluid or swelling. No visible canal hematoma. Disc levels:  Multilevel degenerative changes with osteophyte. Upper chest: Negative. Other: None IMPRESSION: 1. No acute intracranial pathology. 2. No acute facial bone fractures. 3. No acute/traumatic cervical spine pathology. Electronically Signed   By: Anner Crete M.D.   On: 09/24/2018 03:13   Dg Shoulder Left  Result Date: 09/24/2018 CLINICAL DATA:  Fall.  History of seizure. EXAM: LEFT SHOULDER - 2+ VIEW COMPARISON:  None. FINDINGS: There is no evidence of fracture or dislocation. There is no evidence of arthropathy or other focal bone abnormality. Soft tissues are unremarkable. IMPRESSION: Negative. Electronically Signed   By: Kerby Moors M.D.   On: 09/24/2018 02:55   Ct Maxillofacial Wo Contrast  Result Date: 09/24/2018 CLINICAL DATA:  47 year old male with trauma. EXAM: CT HEAD WITHOUT CONTRAST CT MAXILLOFACIAL WITHOUT CONTRAST CT CERVICAL SPINE WITHOUT CONTRAST TECHNIQUE: Multidetector CT imaging of the head, cervical spine, and maxillofacial structures were performed using the standard protocol without intravenous contrast. Multiplanar CT image reconstructions of the cervical spine and maxillofacial structures were also generated. COMPARISON:  Head CT dated 12/22/2016 FINDINGS: CT HEAD FINDINGS Brain: The ventricles and sulci appropriate size for patient's age. The gray-white matter discrimination is preserved. There is no acute intracranial hemorrhage. No mass effect or midline shift. No extra-axial fluid collection. Vascular: No hyperdense vessel or unexpected calcification. Skull: Normal. Negative for fracture or focal lesion. Other: None CT  MAXILLOFACIAL FINDINGS Osseous: No acute fracture or dislocation. Orbits: Negative. No traumatic or inflammatory finding. Sinuses: Mild diffuse mucoperiosteal thickening of paranasal sinuses. The mastoid air cells are clear. Soft tissues: None CT CERVICAL SPINE FINDINGS Alignment: No acute subluxation. Straightening of normal cervical lordosis likely related to degenerative changes. Skull base and vertebrae: No acute fracture Soft tissues and spinal canal: No prevertebral fluid or swelling. No visible canal hematoma. Disc levels:  Multilevel degenerative changes with osteophyte. Upper chest: Negative. Other: None IMPRESSION: 1. No acute intracranial pathology. 2. No acute facial bone fractures. 3. No acute/traumatic cervical spine pathology. Electronically Signed   By: Anner Crete M.D.   On: 09/24/2018 03:13    Procedures Procedures (including critical care time)  Medications Ordered in ED Medications  gadobutrol (GADAVIST) 1 MMOL/ML injection 10 mL (10 mLs Intravenous Contrast Given 09/24/18 0655)     Initial Impression / Assessment and Plan / ED Course  I have reviewed the triage vital signs and the nursing notes.  Pertinent labs & imaging results that were available during my care of the patient were reviewed by me and considered in my medical decision making (see chart for details).  Clinical Course as of Sep 25 727  Nancy Fetter Sep 24, 2018  0422 I discussed  the work-up with Dr. Cheral Marker of the neurology service who recommends MRI of the brain and the cervical spine with contrast to further evaluate the right arm weakness   [BM]    Clinical Course User Index [BM] Noemi Chapel, MD    The patient's injuries have resulted from a fall from a standing position.  At this time he does not have any significant trauma outwardly however given the head injury and the seizure and his abnormal mental status a CT scan will be ordered to rule out intracranial hemorrhage or fracture.  The patient and the  family member are agreeable.  He is not actively seizing at this time.  Change of shift - care signed out to Dr. Maryan Rued  Final Clinical Impressions(s) / ED Diagnoses   Final diagnoses:  Fall    ED Discharge Orders    None       Noemi Chapel, MD 09/24/18 [phone]

## 2018-09-25 ENCOUNTER — Telehealth: Payer: Self-pay | Admitting: Neurology

## 2018-09-25 NOTE — Telephone Encounter (Signed)
Spoke to patient's mother, Michel Hendon (on [place]).  She is concerned about his right, upper extremity weakness that has progressively become worse over the last few weeks.  She would like him to come in for an earlier appt to review the scans from his ED visit on 09/24/18.  His appt has been moved so he can be seen sooner.  Additionally, she reports him having a few seizures but feels his overall frequency has improved with lamotrigine.  She wants to wait until his follow up on 10/04/18 to further discuss with Dr. Krista Blue.

## 2018-09-25 NOTE — Telephone Encounter (Signed)
Dr. Krista Blue is aware and will discuss in more detail at his upcoming follow up appt.

## 2018-09-25 NOTE — Telephone Encounter (Signed)
Patient's mother Lelan Pons (on [place]) calling to discuss patient's recent visit to the ED. Patient has appointment with Dr. Krista Blue on 10-23-18 and was not sure if patient needs to be seen sooner.

## 2018-10-04 ENCOUNTER — Telehealth: Payer: Self-pay | Admitting: Neurology

## 2018-10-04 ENCOUNTER — Ambulatory Visit (INDEPENDENT_AMBULATORY_CARE_PROVIDER_SITE_OTHER): Payer: Medicare Other | Admitting: Neurology

## 2018-10-04 ENCOUNTER — Encounter: Payer: Self-pay | Admitting: Neurology

## 2018-10-04 VITALS — BP 160/92 | HR 81 | Ht 69.5 in | Wt 234.5 lb

## 2018-10-04 DIAGNOSIS — M4802 Spinal stenosis, cervical region: Secondary | ICD-10-CM | POA: Insufficient documentation

## 2018-10-04 DIAGNOSIS — R569 Unspecified convulsions: Secondary | ICD-10-CM | POA: Diagnosis not present

## 2018-10-04 DIAGNOSIS — R269 Unspecified abnormalities of gait and mobility: Secondary | ICD-10-CM

## 2018-10-04 NOTE — Progress Notes (Signed)
[facility]  PATIENT: Andrew [name] DOB: 10-Feb-1971  HISTORY OF PRESENT ILLNESS:[name] returns for followup. He is with his mother.He has a history of intractable seizure disorder since 47 years old. He has been followed in the office since 1990. 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  He has had EMU monitoring at [facility] in the past that included abnormal baseline EEG due to intermittent predominantly right temporal spike, sharp right,and focal slowing. He continues to refuse surgery to remove irritable focus.  Over the years, he has tried different combination, he has been on current medications  Topamax 100 mg twice a day, +50 mg twice a day, Dilantin 100 mg 2 tablets twice a day, Keppra 500 mg 3 tablets b.i.d, brand name  He is also taking Buspirone 10 mg t.i.d., citalopram 20 mg every day for his depression and angry control prescribed by psychiatry.  He continues to have 2-3 seizures every day, complex partial with secondary generalization, he had burst open few helmet due to his seizure, he continued to smoke marijuana and cigarette. He helps his mother taking care of his elderly bed ridden grandmother at home, has raging spells sometimes.   Vimpat 150 twice a day was added since 2015,   He still has seizure almost daily,  He is now taking Topamax 100 plus 50 mg twice a day , Dilantin 200 mg twice a day, Keppra 500 mg 3 tablets twice a day, Vimpat 150 mg twice a day  UPDATE February 07 2017: He came here urgently in early February 2018 because bilateral hands shaking, he was given the prescription of Vraylar, his shaking has much improved after stop the medication.  He is going through dental procedure, his mother had POA, they agreed to proceed with VNS  UPDATE June 09 2017: His seizure overall has much improved, is no longer having a daily basis, is tolerating current medications, but yesterday on June 08 2017, he  had few recurrent seizures, he is still going through dental procedures,  He is taking Keppra 500 mg 3 tablets, Vimpat 150 mg 2 tablets, topiramate 100 mg 2 tablets twice a day  UPDATE Dec 12 2017: His seizure overall has improved some, he also goes to [facility], taking buspiprone, which has helped his mood some,  Continue have frequent migraine headaches, 3-4 times each week, sometimes multiple episode in the day,  He smokes cigarettes some, mother has convinced him to stop use marijuana.  He continue have significant depression, after discussed with mother and patient, we decided to switch Keppra to lamotrigine,  We also talked about VNS placement, he wants to hold off   UPDATE March 20 2018: He is doing much better now with current combinations, lamotrigine 100 mg 2 tablets twice a day, Vimpat 150 mg 2 tablets twice a day, Topamax 100 mg 2 tablets twice a day  Only few recurrent seizure, much better than his previous baseline his mood has improved as well  UPDATE July 20 2018: He is doing much better now with current combination of Vimpat 150 mg 2 tablets twice a day, lamotrigine 100 mg 3 tablets twice a day, Topamax 100 mg twice a day, he only has seizure once or twice each week, they are short lasting, sometimes without recurrent seizure in 1 week, this is in contrast to previous daily multiple seizure episode,  But he complains of dizziness, lightheadedness after each dose of medications,  His depression has much improved too, is more  compliant with his mother  EEG in April 2019 showed generalized epileptiform discharge in the background of mild dysrhythmic slowing.  Update October 04, 2018 He is accompanied by his mother at today's visit, he presented to emergency room on September 24, 2018 after found falling down at home, he has no recollection of the event, mother thought he might have a seizure, his seizure overall is under excellent control, tolerating  lamotrigine XR 300 mg 2 tablets at nighttime, Vimpat 2 tablets twice a day, his mood is also under good control.  I personally reviewed MRI of brain: Advanced atrophy of the cerebellum, likely secondary to chronic antiepileptic medication MRI of cervical spine: Multilevel degenerative changes, moderate to severe foraminal narrowing at C3-4, C7-T1, secondary to multilevel uncovertebral disease, there is no evidence of canal stenosis.  Patient complains slow worsening gait abnormality over the past few months, urinary frequency, urgency, chronic low back [name], also mild neck [name] radiating [name] to right hand, arm   REVIEW OF SYSTEMS: Full 14 system review of systems performed and notable only for those listed, all others are neg:  Excessive sweating, eye redness, blurred vision, black stool, snoring, frequent urination, difficulty urinating, walking difficulty, dizziness, numbness, seizure, weakness  ROS review of the system were negative   ALLERGIES: No Known Allergies  HOME MEDICATIONS: Outpatient Medications Prior to Visit  Medication Sig Dispense Refill  . busPIRone (BUSPAR) 15 MG tablet Take 15 mg by mouth 3 (three) times daily.     Marland Kitchen lacosamide (VIMPAT) 200 MG TABS tablet Take 2 tablets (400 mg total) by mouth 2 (two) times daily. 360 tablet 4  . LamoTRIgine 300 MG TB24 24 hour tablet Take 2 tablets (600 mg total) by mouth at bedtime. 180 tablet 4   No facility-administered medications prior to visit.     PAST MEDICAL HISTORY: Past Medical History:  Diagnosis Date  . Anxiety   . Depression   . Seizures ([place])     PAST SURGICAL HISTORY: Past Surgical History:  Procedure Laterality Date  . none      FAMILY HISTORY: Family History  Problem Relation Age of Onset  . Asthma Father     SOCIAL HISTORY: Social History   Socioeconomic History  . Marital status: Single    Spouse name: Not on file  . Number of children: 0  . Years of education: 77  . Highest education  level: Not on file  Occupational History    Comment: Disabled  Social Needs  . Financial resource strain: Not on file  . Food insecurity:    Worry: Not on file    Inability: Not on file  . Transportation needs:    Medical: Not on file    Non-medical: Not on file  Tobacco Use  . Smoking status: Current Every Day Smoker    Packs/day: 0.50    Years: 20.00    Pack years: 10.00    Types: Cigarettes  . Smokeless tobacco: Never Used  Substance and Sexual Activity  . Alcohol use: No    Alcohol/week: 0.0 standard drinks  . Drug use: No    Types: Marijuana    Comment: 11/08/16 - reports he has stopped smoking marijuana.  . Sexual activity: Not on file  Lifestyle  . Physical activity:    Days per week: Not on file    Minutes per session: Not on file  . Stress: Not on file  Relationships  . Social connections:    Talks on phone: Not on file  Gets together: Not on file    Attends religious service: Not on file    Active member of club or organization: Not on file    Attends meetings of clubs or organizations: Not on file    Relationship status: Not on file  . Intimate partner violence:    Fear of current or ex partner: Not on file    Emotionally abused: Not on file    Physically abused: Not on file    Forced sexual activity: Not on file  Other Topics Concern  . Not on file  Social History Narrative   Patient lives at home with his mother Andrew [name] ). Patient is disabled. Patient has high school education.   Left handed.   Caffeine- soda - pepsi four cans daily.   Patients father died of over dose.-40     PHYSICAL EXAM  Vitals:   10/04/18 1255  BP: (!) 160/92  Pulse: 81  Weight: 234 lb 8 oz (106.4 kg)  Height: 5' 9.5" (1.765 m)   Body mass index is 34.13 kg/m. Generalized: Well developed, in no acute distress, wear helmet Head: normocephalic and atraumatic,. gingival hypertrophy, wearing helmet  Neck: Supple, no carotid bruits  Musculoskeletal: No  deformity  Neurological examination  Mentation: Alert oriented to time, place, history taking. Follows most commands speech and language fluent  Cranial nerve II-XII: Pupils were equal round reactive to light extraocular movements were full, visual field were full on confrontational test. Facial sensation and strength were normal. hearing was intact to finger rubbing bilaterally. Uvula tongue midline. head turning and shoulder shrug were normal and symmetric.Tongue protrusion into cheek strength was normal.  Motor: Right hand intrinsic muscle atrophy, mild weakness at right elbow extension, shoulder external rotation, wrist flexion, extension, grip, moderate weakness on finger abduction Coordination: finger-nose-finger, heel-to-shin bilaterally, no dysmetria  Reflexes: Brachioradialis 2/2, biceps 2/2, triceps absent patellar absent, Achilles absent plantar responses were flexor bilaterally.  Gait and Station: Rising up from seated position by pushing on chair arm, cautious, dragging right leg  DIAGNOSTIC DATA (LABS, IMAGING, TESTING) -   ASSESSMENT AND PLAN 47 y.o. year old [name]  has a past medical history of  Intractable epilepsy  Doing very well with current extended release 300mg  2 tabs every night.   Vimpat to 200 mg 2 tablets twice a day,  Slow worsening gait abnormality,  Length dependent sensory changes, hyperreflexia of bilateral lower extremity, right intrinsic hand muscle atrophy, right distal arm weakness  Evidence of advanced cervical degenerative disease on MRI, moderate to severe multilevel foraminal narrowing  EMG nerve conduction study right cervical radiculopathy  MRI of lumbar spine  Refer him to neurosurgeon    Marcial Pacas, M.D. Ph.D.  [facility] [place], [place] [address] Phone: [phone] Fax:      [phone]

## 2018-10-04 NOTE — Telephone Encounter (Signed)
Lvm for pt to be aware . I also left GI phone number of [phone] and to give them a call if he has not heard in the next 2-3 business days.

## 2018-10-04 NOTE — Telephone Encounter (Signed)
Medicare/medicaid order sent to GI. No auth they will reach out to the pt to schedule.  °

## 2018-10-10 ENCOUNTER — Encounter: Payer: Medicare Other | Admitting: Neurology

## 2018-10-10 ENCOUNTER — Ambulatory Visit
Admission: RE | Admit: 2018-10-10 | Discharge: 2018-10-10 | Disposition: A | Discharge status: Home / Self Care | Payer: Medicare Other | Source: Ambulatory Visit | Attending: Neurology | Admitting: Neurology

## 2018-10-10 DIAGNOSIS — R569 Unspecified convulsions: Secondary | ICD-10-CM

## 2018-10-10 DIAGNOSIS — M545 Low back pain: Secondary | ICD-10-CM | POA: Diagnosis not present

## 2018-10-10 DIAGNOSIS — R269 Unspecified abnormalities of gait and mobility: Secondary | ICD-10-CM | POA: Diagnosis not present

## 2018-10-10 DIAGNOSIS — M4802 Spinal stenosis, cervical region: Secondary | ICD-10-CM

## 2018-10-12 ENCOUNTER — Telehealth: Payer: Self-pay | Admitting: Neurology

## 2018-10-12 NOTE — Telephone Encounter (Signed)
please call patient, MRI lumbar showed mild degenerative changes, no evidence of nerve and spinal cord compression  IMPRESSION:   MRI lumbar spine (without) demonstrating: - At L3-4: disc bulging and facet hypertrophy with no spinal stenosis or foraminal narrowing. - At L4-5: disc bulging and facet hypertrophy with mild biforaminal stenosis. - Incidental left renal cyst (1.5cm).

## 2018-10-12 NOTE — Telephone Encounter (Signed)
Spoke to patient's mother, Long Brimage (on [place]) - she is aware of the MRI results and verbalized understanding.

## 2018-10-13 ENCOUNTER — Telehealth: Payer: Self-pay | Admitting: Neurology

## 2018-10-13 NOTE — Telephone Encounter (Signed)
Andrew [name] with [facility] requesting a call at [phone] (press zero and ask for her) stating she is needing to discuss the referral.

## 2018-10-15 ENCOUNTER — Encounter (HOSPITAL_COMMUNITY): Payer: Self-pay | Admitting: Emergency Medicine

## 2018-10-15 ENCOUNTER — Other Ambulatory Visit: Payer: Self-pay

## 2018-10-15 ENCOUNTER — Emergency Department (HOSPITAL_COMMUNITY)
Admission: EM | Admit: 2018-10-15 | Discharge: 2018-10-15 | Disposition: A | Discharge status: Home / Self Care | Payer: Medicare Other | Attending: Emergency Medicine | Admitting: Emergency Medicine

## 2018-10-15 DIAGNOSIS — Y929 Unspecified place or not applicable: Secondary | ICD-10-CM | POA: Insufficient documentation

## 2018-10-15 DIAGNOSIS — S0990XA Unspecified injury of head, initial encounter: Secondary | ICD-10-CM | POA: Diagnosis not present

## 2018-10-15 DIAGNOSIS — Z79899 Other long term (current) drug therapy: Secondary | ICD-10-CM | POA: Diagnosis not present

## 2018-10-15 DIAGNOSIS — Y999 Unspecified external cause status: Secondary | ICD-10-CM | POA: Insufficient documentation

## 2018-10-15 DIAGNOSIS — R52 Pain, unspecified: Secondary | ICD-10-CM | POA: Diagnosis not present

## 2018-10-15 DIAGNOSIS — W19XXXA Unspecified fall, initial encounter: Secondary | ICD-10-CM | POA: Diagnosis not present

## 2018-10-15 DIAGNOSIS — S40211A Abrasion of right shoulder, initial encounter: Secondary | ICD-10-CM | POA: Diagnosis not present

## 2018-10-15 DIAGNOSIS — Y939 Activity, unspecified: Secondary | ICD-10-CM | POA: Diagnosis not present

## 2018-10-15 DIAGNOSIS — S01311A Laceration without foreign body of right ear, initial encounter: Secondary | ICD-10-CM

## 2018-10-15 DIAGNOSIS — F17210 Nicotine dependence, cigarettes, uncomplicated: Secondary | ICD-10-CM | POA: Diagnosis not present

## 2018-10-15 DIAGNOSIS — I10 Essential (primary) hypertension: Secondary | ICD-10-CM | POA: Diagnosis not present

## 2018-10-15 DIAGNOSIS — Z23 Encounter for immunization: Secondary | ICD-10-CM | POA: Diagnosis not present

## 2018-10-15 LAB — I-STAT CHEM 8, ED
BUN: 12 mg/dL (ref 6–20)
CALCIUM ION: 1.11 mmol/L — AB (ref 1.15–1.40)
CHLORIDE: 109 mmol/L (ref 98–111)
CREATININE: 1.2 mg/dL (ref 0.61–1.24)
GLUCOSE: 103 mg/dL — AB (ref 70–99)
HCT: 47 % (ref 39.0–52.0)
Hemoglobin: 16 g/dL (ref 13.0–17.0)
Potassium: 3.8 mmol/L (ref 3.5–5.1)
Sodium: 141 mmol/L (ref 135–145)
TCO2: 25 mmol/L (ref 22–32)

## 2018-10-15 MED ORDER — TETANUS-DIPHTH-ACELL PERTUSSIS 5-2.5-18.5 LF-MCG/0.5 IM SUSP
0.5000 mL | Freq: Once | INTRAMUSCULAR | Status: AC
  Administered 2018-10-15: 0.5 mL via INTRAMUSCULAR
  Filled 2018-10-15: qty 0.5

## 2018-10-15 NOTE — ED Provider Notes (Signed)
TIME SEEN: 1:19 AM  CHIEF COMPLAINT: Head injury, right ear laceration  HPI: Patient is a 47 year old male with history of seizures on Vimpat and Lamictal, depression who presents to the emergency department with EMS and his mother for concerns for a fall that occurred just prior to arrival.  Patient unable to tell me why he fell.    Mother states that he has chronic difficulties with balance but this may also have been a "mild seizure".  No postictal state. Mother reports that she thinks he hit his head on a table.  He is not on blood thinners. Has a laceration to the right earlobe.  Unsure of last tetanus vaccination.  He denies any pain at this time that is new from his fall.  Has an abrasion to the right posterior shoulder.  No numbness, tingling or focal weakness.  Reports compliance with seizure medications.  No fevers, cough, vomiting or diarrhea.  ROS: See HPI Constitutional: no fever  Eyes: no drainage  ENT: no runny nose   Cardiovascular:  no chest pain  Resp: no SOB  GI: no vomiting GU: no dysuria Integumentary: no rash  Allergy: no hives  Musculoskeletal: no leg swelling  Neurological: no slurred speech ROS otherwise negative  PAST MEDICAL HISTORY/PAST SURGICAL HISTORY:  Past Medical History:  Diagnosis Date  . Anxiety   . Depression   . Seizures ([place])     MEDICATIONS:  Prior to Admission medications   Medication Sig Start Date End Date Taking? Authorizing Provider  busPIRone (BUSPAR) 15 MG tablet Take 15 mg by mouth 3 (three) times daily.     [provider]  lacosamide (VIMPAT) 200 MG TABS tablet Take 2 tablets (400 mg total) by mouth 2 (two) times daily. 07/20/18   Marcial Pacas, MD  LamoTRIgine 300 MG TB24 24 hour tablet Take 2 tablets (600 mg total) by mouth at bedtime. 07/20/18   Marcial Pacas, MD    ALLERGIES:  No Known Allergies  SOCIAL HISTORY:  Social History   Tobacco Use  . Smoking status: Current Every Day Smoker    Packs/day: 0.50    Years:  20.00    Pack years: 10.00    Types: Cigarettes  . Smokeless tobacco: Never Used  Substance Use Topics  . Alcohol use: No    Alcohol/week: 0.0 standard drinks    FAMILY HISTORY: Family History  Problem Relation Age of Onset  . Asthma Father     EXAM: BP (!) 148/106 (BP Location: Right Arm)   Pulse 89   Temp 98.9 F (37.2 C) (Oral)   Resp 18   SpO2 96%  CONSTITUTIONAL: Alert and oriented and responds appropriately to questions. Well-appearing; well-nourished; GCS 15 HEAD: Normocephalic; patient has a through and through laceration to the right earlobe and a small avulsion flap just behind the right earlobe EYES: Conjunctivae clear, PERRL, EOMI ENT: normal nose; no rhinorrhea; moist mucous membranes; pharynx without lesions noted; no dental injury; no septal hematoma NECK: Supple, no meningismus, no LAD; no midline spinal tenderness, step-off or deformity; trachea midline CARD: RRR; S1 and S2 appreciated; no murmurs, no clicks, no rubs, no gallops RESP: Normal chest excursion without splinting or tachypnea; breath sounds clear and equal bilaterally; no wheezes, no rhonchi, no rales; no hypoxia or respiratory distress CHEST:  chest wall stable, no crepitus or ecchymosis or deformity, nontender to palpation; no flail chest ABD/GI: Normal bowel sounds; non-distended; soft, non-tender, no rebound, no guarding; no ecchymosis or other lesions noted PELVIS:  stable,  nontender to palpation BACK:  The back appears normal and is non-tender to palpation, there is no CVA tenderness; no midline spinal tenderness, step-off or deformity EXT: Normal ROM in all joints; non-tender to palpation; no edema; normal capillary refill; no cyanosis, no bony tenderness or bony deformity of patient's extremities, no joint effusion, compartments are soft, extremities are warm and well-perfused, no ecchymosis SKIN: Normal color for age and race; warm, abrasion to the posterior right shoulder NEURO: Moves all  extremities equally, sensation to light touch intact diffusely, cranial nerves II through XII intact, normal speech, no drift PSYCH: The patient's mood and manner are appropriate. Grooming and personal hygiene are appropriate.  MEDICAL DECISION MAKING: Patient here with fall, head injury.  Not on any blood thinners.  Neurologically intact.  Denies any new pain.  Small laceration to the ear has been cleaned and repaired with Dermabond.  Tetanus vaccination updated today.  Basic labs show normal sodium, normal glucose.  No recent infectious symptoms.  Reports compliance with his Lamictal and Vimpat.  Patient and mother comfortable with plan for discharge home.  Discussed wound care instructions.  Discussed return precautions.  I do not feel he needs emergent imaging of his head at this time.  Doubt intracranial hemorrhage.  At this time, I do not feel there is any life-threatening condition present. I have reviewed and discussed all results (EKG, imaging, lab, urine as appropriate) and exam findings with patient/family. I have reviewed nursing notes and appropriate previous records.  I feel the patient is safe to be discharged home without further emergent workup and can continue workup as an outpatient as needed. Discussed usual and customary return precautions. Patient/family verbalize understanding and are comfortable with this plan.  Outpatient follow-up has been provided if needed. All questions have been answered.     LACERATION REPAIR Performed by: Pryor Curia Authorized by: Pryor Curia Consent: Verbal consent obtained. Risks and benefits: risks, benefits and alternatives were discussed Consent given by: patient Patient identity confirmed: provided demographic data Prepped and Draped in normal sterile fashion Wound explored  Laceration Location: Right earlobe, behind right ear  Laceration Length: 1.5 cm  No Foreign Bodies seen or palpated  Anesthesia: local infiltration  Local  anesthetic: None required  Irrigation method: syringe Amount of cleaning: standard  Skin closure: Simple  Number of sutures: 0  Technique: Area cleaned with ChloraPrep and wound approximated with Dermabond.  Wound hemostatic.  Patient tolerance: Patient tolerated the procedure well with no immediate complications.     , Delice Bison, DO 10/15/18 [phone]

## 2018-10-15 NOTE — ED Triage Notes (Signed)
Pt BIB [facility] after a mechanical fall. Hx seizures. A&O x 4 on arrival, pt has laceration to left ear, bleeding controlled at this time.

## 2018-10-16 ENCOUNTER — Telehealth: Payer: Self-pay | Admitting: Neurology

## 2018-10-16 DIAGNOSIS — R269 Unspecified abnormalities of gait and mobility: Secondary | ICD-10-CM

## 2018-10-16 NOTE — Telephone Encounter (Signed)
Called Rollene Fare and left her a message asking her to call me back .

## 2018-10-16 NOTE — Telephone Encounter (Signed)
Called and spoke to his mother and she is going [facility] to schedule [phone].

## 2018-10-16 NOTE — Telephone Encounter (Signed)
Returned call to his mother (on [place]). Dr. Krista Blue has authorized the prescription.  It has been printed, signed and placed in the mail to the patient's home address (per his mother's request).

## 2018-10-16 NOTE — Telephone Encounter (Signed)
Patient's mother is asking can she having an order for a walker for balance  Simona Huh. Thanks Hinton Dyer.

## 2018-10-16 NOTE — Addendum Note (Signed)
Addended by: Desmond Lope on: 10/16/2018 04:48 PM   Modules accepted: Orders

## 2018-10-23 ENCOUNTER — Ambulatory Visit: Payer: Medicare Other | Admitting: Neurology

## 2018-10-30 DIAGNOSIS — R030 Elevated blood-pressure reading, without diagnosis of hypertension: Secondary | ICD-10-CM | POA: Diagnosis not present

## 2018-10-30 DIAGNOSIS — Z6833 Body mass index (BMI) 33.0-33.9, adult: Secondary | ICD-10-CM | POA: Diagnosis not present

## 2018-10-30 DIAGNOSIS — R2681 Unsteadiness on feet: Secondary | ICD-10-CM | POA: Diagnosis not present

## 2018-10-30 DIAGNOSIS — R29898 Other symptoms and signs involving the musculoskeletal system: Secondary | ICD-10-CM | POA: Diagnosis not present

## 2018-11-01 IMAGING — CT CT HEAD W/O CM
4 series · 15 of 47 positions shown, 17 images · non-contrast
Comparison: 10/01/2016

CLINICAL DATA: Fell tonight.

EXAM:
CT HEAD WITHOUT CONTRAST
TECHNIQUE: Contiguous axial images were obtained from the base of the skull
through the vertex without intravenous contrast.

[Series 2: head without · axial · non-contrast · 0.46mm/px · z∈[+1013,+1133]mm · 7 of 32 slices shown, 9 images]
[im 4/32  brain]
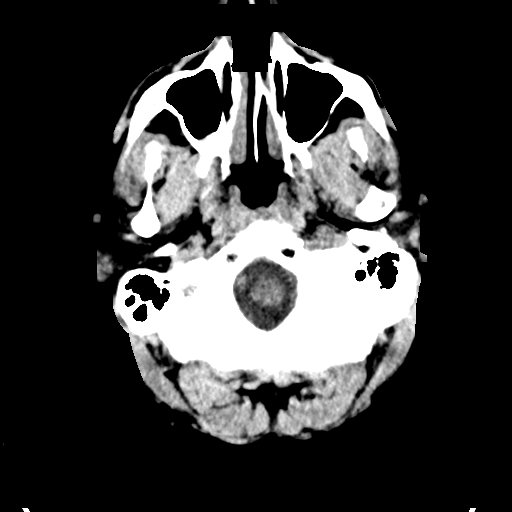
[im 4/32  bone]
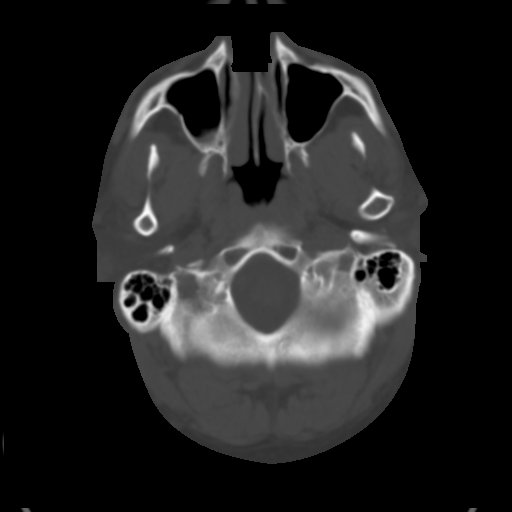
[im 8/32  brain]
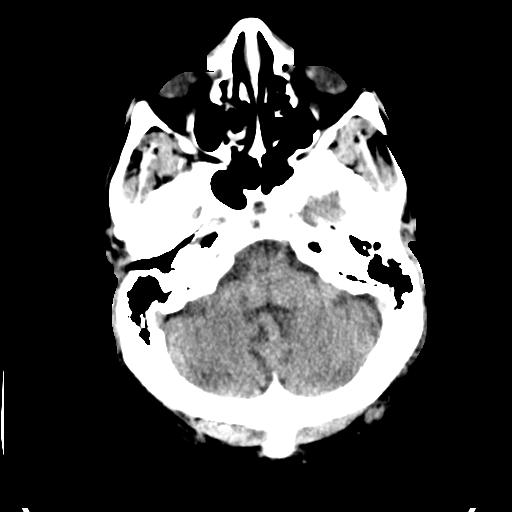
[im 12/32  brain]
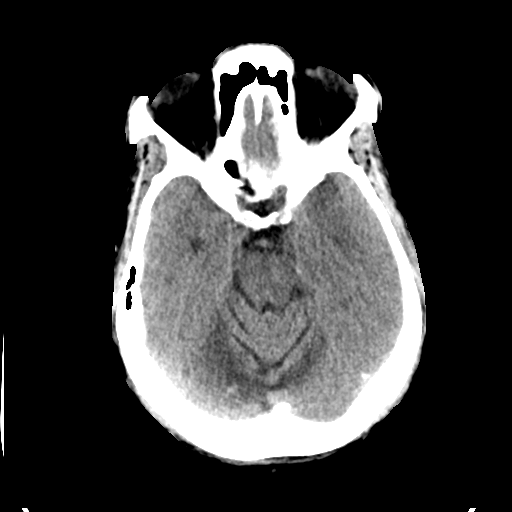
[im 16/32  brain]
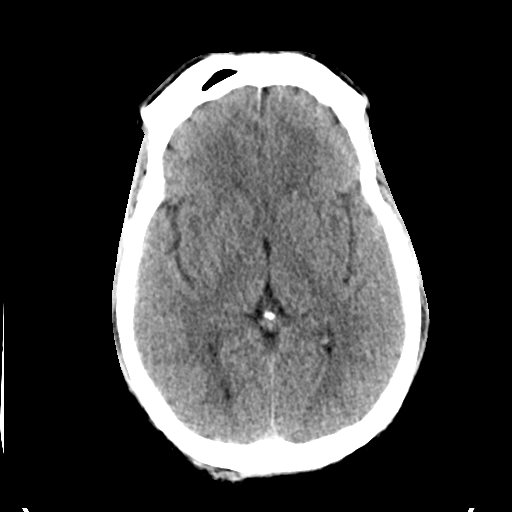
[im 20/32  brain]
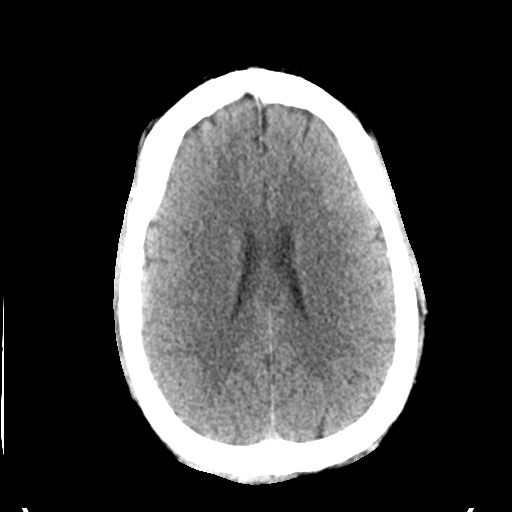
[im 20/32  bone]
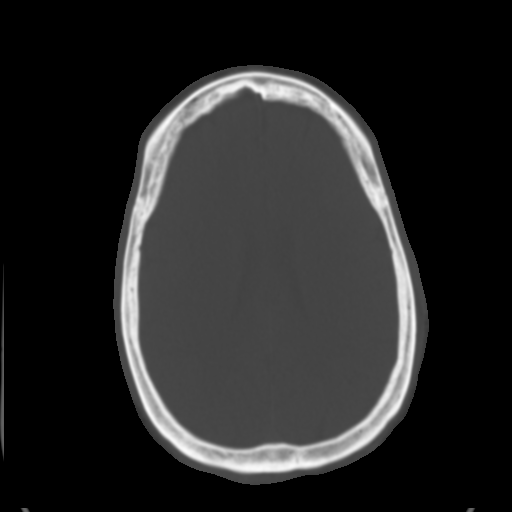
[im 24/32  brain]
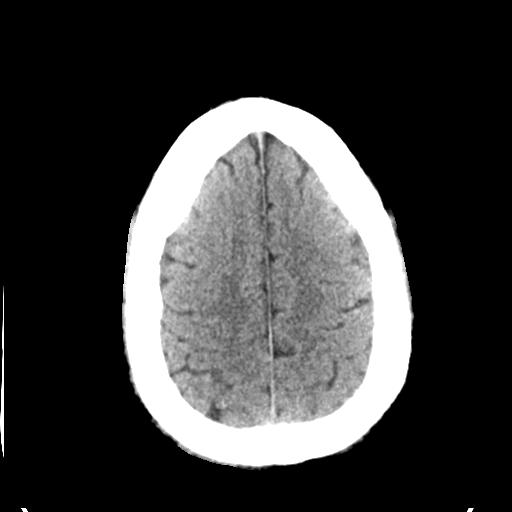
[im 28/32  brain]
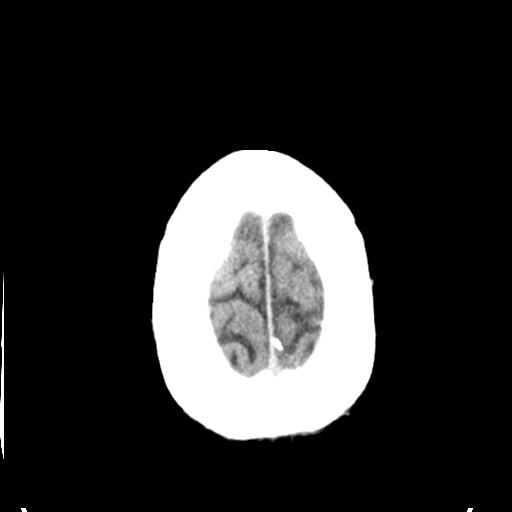

[Series 3: head bone · axial · 0.46mm/px · z∈[+1012,+1028]mm · 2 of 78 slices shown]
[im 8/78  bone]
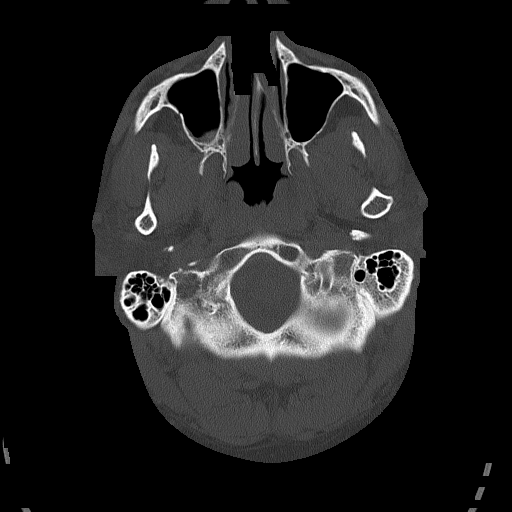
[im 16/78  bone]
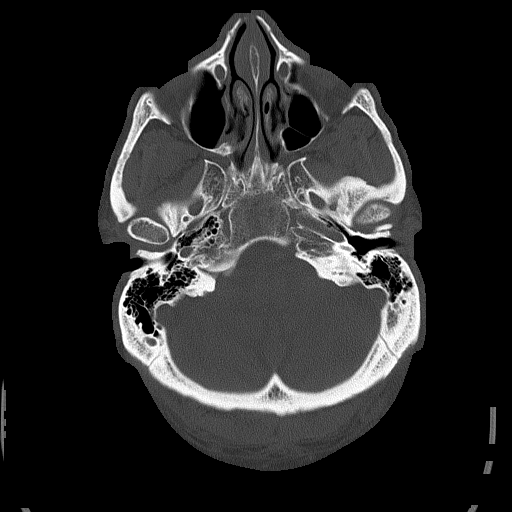

[Series 4: head without cor · coronal · non-contrast · 0.30mm/px · 3 of 73 slices shown]
[im 25/73  brain]
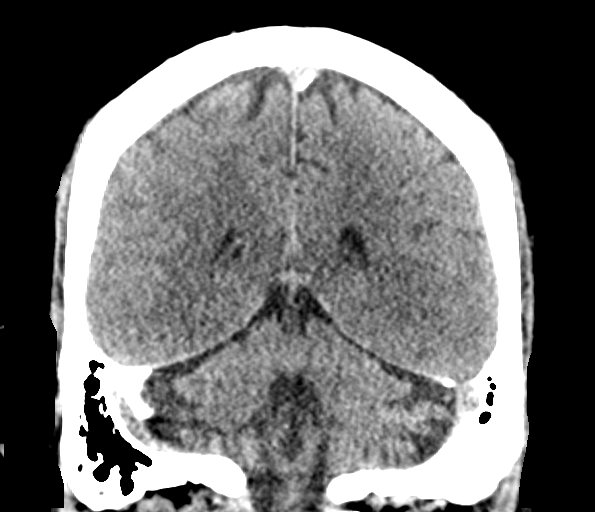
[im 33/73  brain]
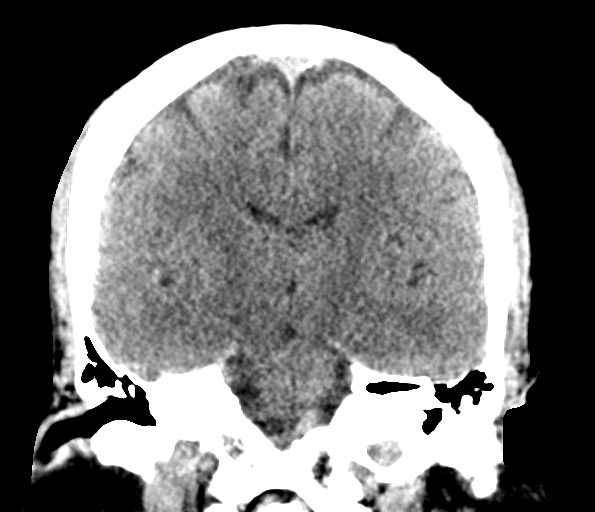
[im 41/73  brain]
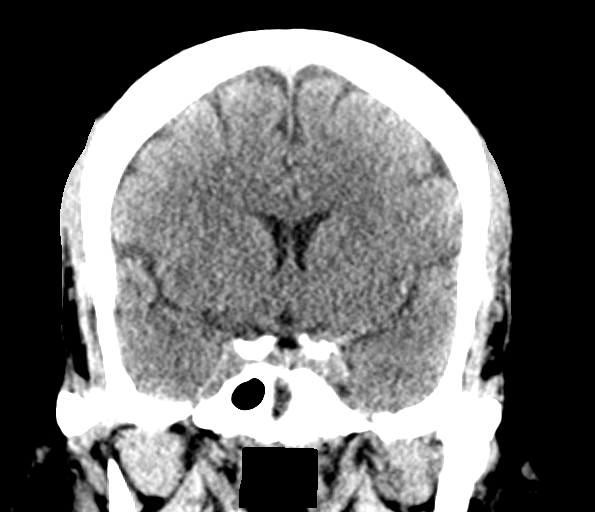

[Series 5: head without sag · sagittal · non-contrast · 0.30mm/px · 3 of 54 slices shown]
[im 18/54  brain]
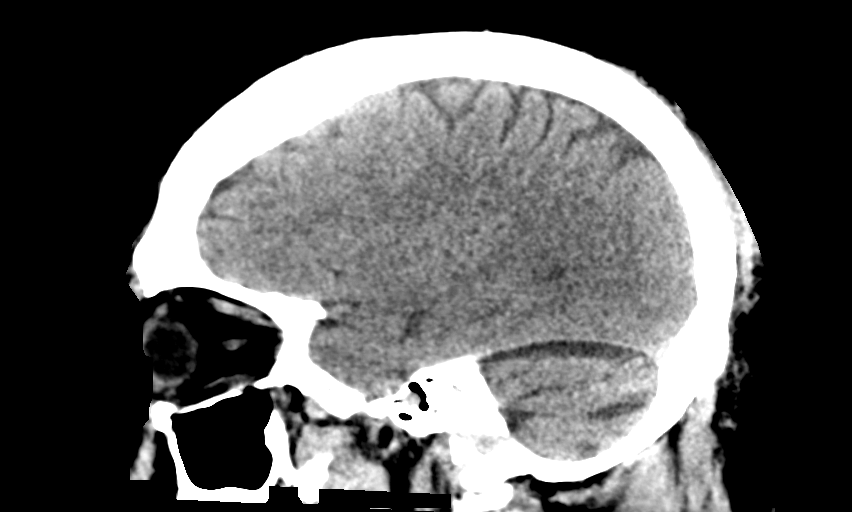
[im 27/54  brain]
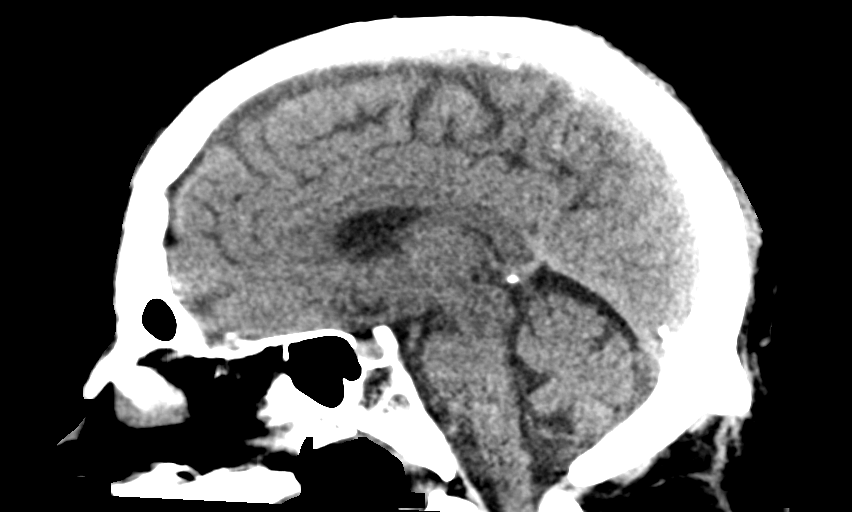
[im 36/54  brain]
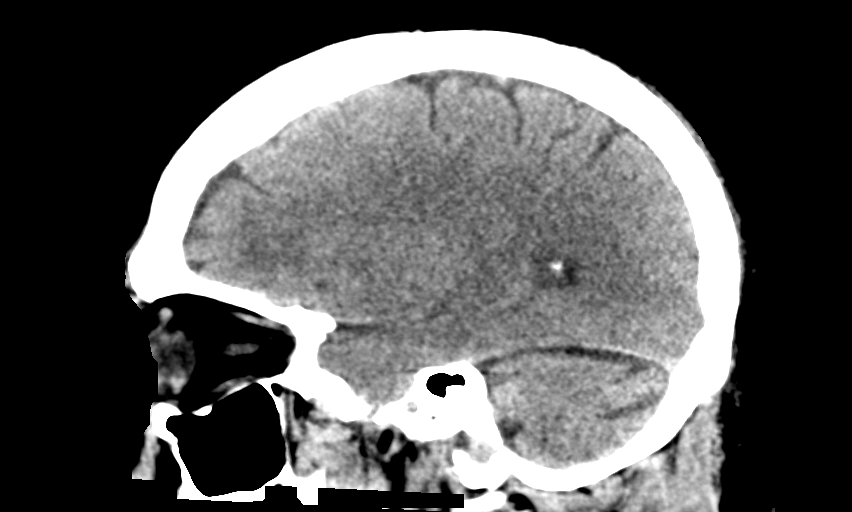

[15 of 47 positions shown; findings below may reference images not displayed]

FINDINGS: Brain: There is no intracranial hemorrhage, mass or evidence of
acute infarction. There is no extra-axial fluid collection. Gray
matter and white matter appear normal. Cerebral volume is normal for
age. Brainstem and posterior fossa are unremarkable. The CSF spaces
appear normal.

Vascular: No hyperdense vessel or unexpected calcification.

Skull: Normal. Negative for fracture or focal lesion.

Sinuses/Orbits: No acute finding.

Other: None.
IMPRESSION: Normal brain

## 2018-11-10 ENCOUNTER — Ambulatory Visit (INDEPENDENT_AMBULATORY_CARE_PROVIDER_SITE_OTHER): Payer: Medicare Other | Admitting: Neurology

## 2018-11-10 DIAGNOSIS — G40209 Localization-related (focal) (partial) symptomatic epilepsy and epileptic syndromes with complex partial seizures, not intractable, without status epilepticus: Secondary | ICD-10-CM | POA: Diagnosis not present

## 2018-11-10 DIAGNOSIS — R269 Unspecified abnormalities of gait and mobility: Secondary | ICD-10-CM

## 2018-11-10 DIAGNOSIS — R200 Anesthesia of skin: Secondary | ICD-10-CM

## 2018-11-10 DIAGNOSIS — M4802 Spinal stenosis, cervical region: Secondary | ICD-10-CM | POA: Diagnosis not present

## 2018-11-10 DIAGNOSIS — R569 Unspecified convulsions: Secondary | ICD-10-CM

## 2018-11-10 NOTE — Procedures (Signed)
Full Name: Andrew [name] Gender: Male MRN #: [id number] Date of Birth: 1971/05/17    Visit Date: 11/10/2018 10:51 Age: 47 Years 51 Months Old Examining Physician: Marcial Pacas, MD  Referring Physician: Krista Blue, MD History: He has significant right hand muscle atrophy, weakness, numbness, gradually getting worse over the past 1 year  Summary of the tests:  Nerve conduction study: Bilateral median, ulnar sensory responses were within normal limits.  Left median and ulnar motor responses were normal. Right median motor responses showed mildly prolonged distal latency, with significantly decreased the C map amplitude, mild slow conduction velocity.  Right ulnar motor responses showed normal distal latency, with moderately decreased the C map amplitude, conduction velocity.  Right ulnar F-wave latency was significantly prolonged.  Electromyography: Selective needle examinations of bilateral upper extremity and right lower extremity muscles; bilateral cervical paraspinals.  There is significant active neuropathic changes involving right first dorsal interossei, abductor pollicis brevis, milder degree flexor carpi ulnaris, brachioradialis, pronator teres, mostly right T1, C8, C7, C6 myotomes  There is also chronic mild neuropathic changes involving left deltoid, biceps, brachioradialis, left C5-6 myotomes.  There is no evidence of active denervation at bilateral cervical paraspinal muscles, but there is evidence of complex enlarged motor unit potential at right C5, C6.  There is no significant neuropathic changes at the needle examination of right lower extremity muscles.  Conclusion: This is an abnormal study.  There is electrodiagnostic evidence of active right cervical radiculopathy, mainly involving right T1, C8, C7, moderate degree C6 myotomes.  There is also evidence of mild left C5-6 cervical radiculopathy.  There is no evidence of right lumbosacral  radiculopathy.    ------------------------------- Marcial Pacas, M.D. PhD  [facility] [place], [place] [address] Tel: [phone] Fax: [phone]        [facility]    Nerve / Sites Muscle Latency Ref. Amplitude Ref. Rel Amp Segments Distance Velocity Ref. Area    ms ms mV mV %  cm m/s m/s mVms  R Median - APB     Wrist APB 5.0 ?4.4 0.4 ?4.0 100 Wrist - APB 7   1.7     Upper arm APB 13.2  0.3  81.1 Upper arm - Wrist 26 32 ?49 1.3  L Median - APB     Wrist APB 3.3 ?4.4 4.5 ?4.0 100 Wrist - APB 7   17.4     Upper arm APB 8.3  4.1  91.1 Upper arm - Wrist 25 49 ?49 15.8  R Ulnar - ADM     Wrist ADM 3.3 ?3.3 2.8 ?6.0 100 Wrist - ADM 7   8.7     B.Elbow ADM 8.3  1.9  67.8 B.Elbow - Wrist 21 42 ?49 7.7     A.Elbow ADM 10.7  1.6  87.6 A.Elbow - B.Elbow 10 42 ?49 8.3         A.Elbow - Wrist      L Ulnar - ADM     Wrist ADM 2.4 ?3.3 8.0 ?6.0 100 Wrist - ADM 7   19.2     B.Elbow ADM 6.6  7.1  89 B.Elbow - Wrist 20 47 ?49 17.9     A.Elbow ADM 8.8  7.2  102 A.Elbow - B.Elbow 10 47 ?49 19.0         A.Elbow - Wrist                 SNC    Nerve /  Sites Rec. Site Peak Lat Ref.  Amp Ref. Segments Distance    ms ms V V  cm  R Median - Orthodromic (Dig II, Mid palm)     Dig II Wrist 3.1 ?3.4 12 ?10 Dig II - Wrist 13  L Median - Orthodromic (Dig II, Mid palm)     Dig II Wrist 3.0 ?3.4 11 ?10 Dig II - Wrist 13  R Ulnar - Orthodromic, (Dig V, Mid palm)     Dig V Wrist 2.7 ?3.1 10 ?5 Dig V - Wrist 11  L Ulnar - Orthodromic, (Dig V, Mid palm)     Dig V Wrist 2.8 ?3.1 5 ?5 Dig V - Wrist 77              F  Wave    Nerve F Lat Ref.   ms ms  R Ulnar - ADM 38.4 ?32.0  L Ulnar - ADM 31.1 ?32.0         EMG full       EMG Summary Table    Spontaneous MUAP Recruitment  Muscle IA Fib PSW Fasc Other Amp Dur. Poly Pattern  R. First dorsal interosseous Increased 2+ None None _______ Increased Increased 1+ Reduced  R. Abductor pollicis brevis Increased None None None  _______ Increased Increased Normal Reduced  R. Pronator teres Increased None None None _______ Increased Increased 1+ Reduced  R. Flexor digitorum profundus (Ulnar) Increased None None None _______ Increased Increased 1+ Reduced  R. Biceps brachii Normal None None None _______ Normal Normal Normal Reduced  R. Deltoid Normal None None None _______ Normal Normal Normal Normal  R. Triceps brachii Normal None None None _______ Normal Normal Normal Normal  R. Brachioradialis Increased None None None _______ Increased Increased Normal Reduced  L. First dorsal interosseous Normal None None None _______ Normal Normal Normal Normal  L. Pronator teres Normal None None None _______ Increased Normal Normal Reduced  L. Biceps brachii Normal None None None _______ Increased Normal Normal Reduced  L. Deltoid Increased None None None _______ Normal Normal Normal Reduced  L. Brachioradialis Increased None None None _______ Increased Increased Normal Reduced  L. Flexor carpi ulnaris Normal None None None _______ Normal Normal Normal Normal  R. Tibialis anterior Normal None None None _______ Normal Normal Normal Normal  R. Tibialis posterior Normal None None None _______ Normal Normal Normal Normal  R. Gastrocnemius (Medial head) Normal None None None _______ Normal Normal Normal Normal  R. Peroneus longus Normal None None None _______ Normal Normal Normal Normal  R. Vastus lateralis Normal None None None _______ Normal Normal Normal Normal  L. Cervical paraspinals Normal None None None _______ Normal Normal Normal Normal  R. Cervical paraspinals Normal None None None _______ Increased Increased 1+ Normal

## 2018-11-10 NOTE — Progress Notes (Signed)
[facility]  PATIENT: Andrew Andrew [name] DOB: 10-Feb-1971  HISTORY OF PRESENT ILLNESS:Mr. Andrew Andrew [name], 47 year old male returns for followup. He is with his mother.He has a history of intractable seizure disorder since 47 years old. He has been followed in the office since 1990. 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  He has had EMU monitoring at [facility] in the past that included abnormal baseline EEG due to intermittent predominantly right temporal spike, sharp right,and focal slowing. He continues to refuse surgery to remove irritable focus.  Over the years, he has tried different combination, he has been on current medications  Topamax 100 mg twice a day, +50 mg twice a day, Dilantin 100 mg 2 tablets twice a day, Keppra 500 mg 3 tablets b.i.d, brand name  He is also taking Buspirone 10 mg t.i.d., citalopram 20 mg every day for his depression and angry control prescribed by psychiatry.  He continues to have 2-3 seizures every day, complex partial with secondary generalization, he had burst open few helmet due to his seizure, he continued to smoke marijuana and cigarette. He helps his mother taking care of his elderly bed ridden grandmother at home, has raging spells sometimes.   Vimpat 150 twice a day was added since 2015,   He still has seizure almost daily,  He is now taking Topamax 100 plus 50 mg twice a day , Dilantin 200 mg twice a day, Keppra 500 mg 3 tablets twice a day, Vimpat 150 mg twice a day  UPDATE February 07 2017: He came here urgently in early February 2018 because bilateral hands shaking, he was given the prescription of Vraylar, his shaking has much improved after stop the medication.  He is going through dental procedure, his mother had POA, they agreed to proceed with VNS  UPDATE June 09 2017: His seizure overall has much improved, is no longer having a daily basis, is tolerating current medications, but yesterday on June 08 2017, he  had few recurrent seizures, he is still going through dental procedures,  He is taking Keppra 500 mg 3 tablets, Vimpat 150 mg 2 tablets, topiramate 100 mg 2 tablets twice a day  UPDATE Dec 12 2017: His seizure overall has improved some, he also goes to [facility], taking buspiprone, which has helped his mood some,  Continue have frequent migraine headaches, 3-4 times each week, sometimes multiple episode in the day,  He smokes cigarettes some, mother has convinced him to stop use marijuana.  He continue have significant depression, after discussed with mother and patient, we decided to switch Keppra to lamotrigine,  We also talked about VNS placement, he wants to hold off   UPDATE March 20 2018: He is doing much better now with current combinations, lamotrigine 100 mg 2 tablets twice a day, Vimpat 150 mg 2 tablets twice a day, Topamax 100 mg 2 tablets twice a day  Only few recurrent seizure, much better than his previous baseline his mood has improved as well  UPDATE July 20 2018: He is doing much better now with current combination of Vimpat 150 mg 2 tablets twice a day, lamotrigine 100 mg 3 tablets twice a day, Topamax 100 mg twice a day, he only has seizure once or twice each week, they are short lasting, sometimes without recurrent seizure in 1 week, this is in contrast to previous daily multiple seizure episode,  But he complains of dizziness, lightheadedness after each dose of medications,  His depression has much improved too, is more  compliant with his mother  EEG in April 2019 showed generalized epileptiform discharge in the background of mild dysrhythmic slowing.  Update October 04, 2018 He is accompanied by his mother at today's visit, he presented to emergency room on September 24, 2018 after found falling down at home, he has no recollection of the event, mother thought he might have a seizure, his seizure overall is under excellent control, tolerating  lamotrigine XR 300 mg 2 tablets at nighttime, Vimpat 2 tablets twice a day, his mood is also under good control.  I personally reviewed MRI of brain: Advanced atrophy of the cerebellum, likely secondary to chronic antiepileptic medication MRI of cervical spine: Multilevel degenerative changes, moderate to severe foraminal narrowing at C3-4, C7-T1, secondary to multilevel uncovertebral disease, there is no evidence of canal stenosis.  Patient complains slow worsening gait abnormality over the past few months, urinary frequency, urgency, chronic low back Andrew [name], also mild neck Andrew [name] radiating Andrew [name] to right hand, arm  UPDate November 10, 2018: EMG nerve conduction study today showed evidence of active right T1, C8, 7, more than 6 radiculopathy, also chronic left C5-6 radiculopathy.  There is no evidence of right lumbar radiculopathy  He does has sensory loss of right hand, mostly at the right fourth and fifth fingers, well-preserved sensory potential on today's nerve conduction study, he was seen by neurosurgeon, per mother consider epidural injection,  I personally reviewed MRI of lumbar in November 2019, multilevel disc degenerative changes, no significant foraminal canal stenosis.  MRI of cervical spine, atrophy of the cerebellum, moderate to severe foraminal stenosis C3-4 through C7-T1, secondary to extensive multilevel uncovertebral disease, given the degree of foraminal stenosis, minimum trauma to the cervical spine could certainly exacerbate radicular symptoms,  Patient did fell multiple times,  REVIEW OF SYSTEMS: Full 14 system review of systems performed and notable only for those listed, all others are neg:  As above  ROS review of the system were negative   ALLERGIES: No Known Allergies  HOME MEDICATIONS: Outpatient Medications Prior to Visit  Medication Sig Dispense Refill  . busPIRone (BUSPAR) 15 MG tablet Take 15 mg by mouth 3 (three) times daily.     Marland Kitchen lacosamide (VIMPAT) 200  MG TABS tablet Take 2 tablets (400 mg total) by mouth 2 (two) times daily. 360 tablet 4  . LamoTRIgine 300 MG TB24 24 hour tablet Take 2 tablets (600 mg total) by mouth at bedtime. 180 tablet 4   No facility-administered medications prior to visit.     PAST MEDICAL HISTORY: Past Medical History:  Diagnosis Date  . Anxiety   . Depression   . Seizures ([place])     PAST SURGICAL HISTORY: Past Surgical History:  Procedure Laterality Date  . none      FAMILY HISTORY: Family History  Problem Relation Age of Onset  . Asthma Father     SOCIAL HISTORY: Social History   Socioeconomic History  . Marital status: Single    Spouse name: Not on file  . Number of children: 0  . Years of education: 68  . Highest education level: Not on file  Occupational History    Comment: Disabled  Social Needs  . Financial resource strain: Not on file  . Food insecurity:    Worry: Not on file    Inability: Not on file  . Transportation needs:    Medical: Not on file    Non-medical: Not on file  Tobacco Use  . Smoking status: Current Every Day Smoker  Packs/day: 0.50    Years: 20.00    Pack years: 10.00    Types: Cigarettes  . Smokeless tobacco: Never Used  Substance and Sexual Activity  . Alcohol use: No    Alcohol/week: 0.0 standard drinks  . Drug use: No    Types: Marijuana    Comment: 11/08/16 - reports he has stopped smoking marijuana.  . Sexual activity: Not on file  Lifestyle  . Physical activity:    Days per week: Not on file    Minutes per session: Not on file  . Stress: Not on file  Relationships  . Social connections:    Talks on phone: Not on file    Gets together: Not on file    Attends religious service: Not on file    Active member of club or organization: Not on file    Attends meetings of clubs or organizations: Not on file    Relationship status: Not on file  . Intimate partner violence:    Fear of current or ex partner: Not on file    Emotionally abused:  Not on file    Physically abused: Not on file    Forced sexual activity: Not on file  Other Topics Concern  . Not on file  Social History Narrative   Patient lives at home with his mother Andrew Andrew [name] ). Patient is disabled. Patient has high school education.   Left handed.   Caffeine- soda - pepsi four cans daily.   Patients father died of over dose.-40     PHYSICAL EXAM  There were no vitals filed for this visit. There is no height or weight on file to calculate BMI. Generalized: Well developed, in no acute distress, wear helmet Head: normocephalic and atraumatic,. gingival hypertrophy, wearing helmet  Neck: Supple, no carotid bruits  Musculoskeletal: No deformity  Neurological examination  Mentation: Alert oriented to time, place, history taking. Follows most commands speech and language fluent  Cranial nerve II-XII: Pupils were equal round reactive to light extraocular movements were full, visual field were full on confrontational test. Facial sensation and strength were normal. hearing was intact to finger rubbing bilaterally. Uvula tongue midline. head turning and shoulder shrug were normal and symmetric.Tongue protrusion into cheek strength was normal.  Motor: Right hand intrinsic muscle atrophy, mild weakness at right elbow extension, moderate weakness of right wrist flexion,  grip, moderate weakness on finger abduction, no significant bilateral lower extremity or left weakness noted. Coordination: finger-nose-finger, heel-to-shin bilaterally, no dysmetria  Reflexes: Brachioradialis 1/1, biceps 1/1, triceps absent patellar absent, Achilles absent plantar responses were flexor bilaterally.  Gait and Station: Rising up from seated position by pushing on chair arm, cautious,  DIAGNOSTIC DATA (LABS, IMAGING, TESTING) -   ASSESSMENT AND PLAN 47 y.o. year old male  has a past medical history of  Intractable epilepsy  Doing very well with current extended release 300mg  2  tabs every night.   Vimpat to 200 mg 2 tablets twice a day,  Slow worsening gait abnormality,  Length dependent sensory changes,right intrinsic hand muscle atrophy, right distal arm weakness   Evidence of advanced cervical degenerative disease on MRI, moderate to severe multilevel foraminal narrowing  This is also complicated by his long-term antidiabetic medication treatment, evidence of advanced atrophy of the cerebellum    Andrew Andrew [name], M.D. Ph.D.  [facility] [place], [place] [address] Phone: [phone] Fax:      [phone]

## 2018-11-10 NOTE — Progress Notes (Signed)
[facility]  PATIENT: Andrew [name] DOB: 10-Feb-1971  HISTORY OF PRESENT ILLNESS:[name], 47 year old male returns for followup. He is with his mother.He has a history of intractable seizure disorder since 47 years old. He has been followed in the office since 1990. 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  He has had EMU monitoring at [facility] in the past that included abnormal baseline EEG due to intermittent predominantly right temporal spike, sharp right,and focal slowing. He continues to refuse surgery to remove irritable focus.  Over the years, he has tried different combination, he has been on current medications  Topamax 100 mg twice a day, +50 mg twice a day, Dilantin 100 mg 2 tablets twice a day, Keppra 500 mg 3 tablets b.i.d, brand name  He is also taking Buspirone 10 mg t.i.d., citalopram 20 mg every day for his depression and angry control prescribed by psychiatry.  He continues to have 2-3 seizures every day, complex partial with secondary generalization, he had burst open few helmet due to his seizure, he continued to smoke marijuana and cigarette. He helps his mother taking care of his elderly bed ridden grandmother at home, has raging spells sometimes.   Vimpat 150 twice a day was added since 2015,   He still has seizure almost daily,  He is now taking Topamax 100 plus 50 mg twice a day , Dilantin 200 mg twice a day, Keppra 500 mg 3 tablets twice a day, Vimpat 150 mg twice a day  UPDATE February 07 2017: He came here urgently in early February 2018 because bilateral hands shaking, he was given the prescription of Vraylar, his shaking has much improved after stop the medication.  He is going through dental procedure, his mother had POA, they agreed to proceed with VNS  UPDATE June 09 2017: His seizure overall has much improved, is no longer having a daily basis, is tolerating current medications, but yesterday on June 08 2017, he  had few recurrent seizures, he is still going through dental procedures,  He is taking Keppra 500 mg 3 tablets, Vimpat 150 mg 2 tablets, topiramate 100 mg 2 tablets twice a day  UPDATE Dec 12 2017: His seizure overall has improved some, he also goes to [facility], taking buspiprone, which has helped his mood some,  Continue have frequent migraine headaches, 3-4 times each week, sometimes multiple episode in the day,  He smokes cigarettes some, mother has convinced him to stop use marijuana.  He continue have significant depression, after discussed with mother and patient, we decided to switch Keppra to lamotrigine,  We also talked about VNS placement, he wants to hold off   UPDATE March 20 2018: He is doing much better now with current combinations, lamotrigine 100 mg 2 tablets twice a day, Vimpat 150 mg 2 tablets twice a day, Topamax 100 mg 2 tablets twice a day  Only few recurrent seizure, much better than his previous baseline his mood has improved as well  UPDATE July 20 2018: He is doing much better now with current combination of Vimpat 150 mg 2 tablets twice a day, lamotrigine 100 mg 3 tablets twice a day, Topamax 100 mg twice a day, he only has seizure once or twice each week, they are short lasting, sometimes without recurrent seizure in 1 week, this is in contrast to previous daily multiple seizure episode,  But he complains of dizziness, lightheadedness after each dose of medications,  His depression has much improved too, is more  compliant with his mother  EEG in April 2019 showed generalized epileptiform discharge in the background of mild dysrhythmic slowing.  Update October 04, 2018 He is accompanied by his mother at today's visit, he presented to emergency room on September 24, 2018 after found falling down at home, he has no recollection of the event, mother thought he might have a seizure, his seizure overall is under excellent control, tolerating  lamotrigine XR 300 mg 2 tablets at nighttime, Vimpat 2 tablets twice a day, his mood is also under good control.  I personally reviewed MRI of brain: Advanced atrophy of the cerebellum, likely secondary to chronic antiepileptic medication MRI of cervical spine: Multilevel degenerative changes, moderate to severe foraminal narrowing at C3-4, C7-T1, secondary to multilevel uncovertebral disease, there is no evidence of canal stenosis.  Patient complains slow worsening gait abnormality over the past few months, urinary frequency, urgency, chronic low back pain, also mild neck pain radiating pain to right hand, arm  UPDATE Nov 10 2018:    REVIEW OF SYSTEMS: Full 14 system review of systems performed and notable only for those listed, all others are neg:  Excessive sweating, eye redness, blurred vision, black stool, snoring, frequent urination, difficulty urinating, walking difficulty, dizziness, numbness, seizure, weakness  ROS review of the system were negative   ALLERGIES: No Known Allergies  HOME MEDICATIONS: Outpatient Medications Prior to Visit  Medication Sig Dispense Refill  . busPIRone (BUSPAR) 15 MG tablet Take 15 mg by mouth 3 (three) times daily.     Marland Kitchen lacosamide (VIMPAT) 200 MG TABS tablet Take 2 tablets (400 mg total) by mouth 2 (two) times daily. 360 tablet 4  . LamoTRIgine 300 MG TB24 24 hour tablet Take 2 tablets (600 mg total) by mouth at bedtime. 180 tablet 4   No facility-administered medications prior to visit.     PAST MEDICAL HISTORY: Past Medical History:  Diagnosis Date  . Anxiety   . Depression   . Seizures ([place])     PAST SURGICAL HISTORY: Past Surgical History:  Procedure Laterality Date  . none      FAMILY HISTORY: Family History  Problem Relation Age of Onset  . Asthma Father     SOCIAL HISTORY: Social History   Socioeconomic History  . Marital status: Single    Spouse name: Not on file  . Number of children: 0  . Years of education: 50   . Highest education level: Not on file  Occupational History    Comment: Disabled  Social Needs  . Financial resource strain: Not on file  . Food insecurity:    Worry: Not on file    Inability: Not on file  . Transportation needs:    Medical: Not on file    Non-medical: Not on file  Tobacco Use  . Smoking status: Current Every Day Smoker    Packs/day: 0.50    Years: 20.00    Pack years: 10.00    Types: Cigarettes  . Smokeless tobacco: Never Used  Substance and Sexual Activity  . Alcohol use: No    Alcohol/week: 0.0 standard drinks  . Drug use: No    Types: Marijuana    Comment: 11/08/16 - reports he has stopped smoking marijuana.  . Sexual activity: Not on file  Lifestyle  . Physical activity:    Days per week: Not on file    Minutes per session: Not on file  . Stress: Not on file  Relationships  . Social connections:    Talks  on phone: Not on file    Gets together: Not on file    Attends religious service: Not on file    Active member of club or organization: Not on file    Attends meetings of clubs or organizations: Not on file    Relationship status: Not on file  . Intimate partner violence:    Fear of current or ex partner: Not on file    Emotionally abused: Not on file    Physically abused: Not on file    Forced sexual activity: Not on file  Other Topics Concern  . Not on file  Social History Narrative   Patient lives at home with his mother Gregor Dershem ). Patient is disabled. Patient has high school education.   Left handed.   Caffeine- soda - pepsi four cans daily.   Patients father died of over dose.-40     PHYSICAL EXAM  There were no vitals filed for this visit. There is no height or weight on file to calculate BMI. Generalized: Well developed, in no acute distress, wear helmet Head: normocephalic and atraumatic,. gingival hypertrophy, wearing helmet  Neck: Supple, no carotid bruits  Musculoskeletal: No deformity  Neurological examination   Mentation: Alert oriented to time, place, history taking. Follows most commands speech and language fluent  Cranial nerve II-XII: Pupils were equal round reactive to light extraocular movements were full, visual field were full on confrontational test. Facial sensation and strength were normal. hearing was intact to finger rubbing bilaterally. Uvula tongue midline. head turning and shoulder shrug were normal and symmetric.Tongue protrusion into cheek strength was normal.  Motor: Right hand intrinsic muscle atrophy, mild weakness at right elbow extension, shoulder external rotation, wrist flexion, extension, grip, moderate weakness on finger abduction Coordination: finger-nose-finger, heel-to-shin bilaterally, no dysmetria  Reflexes: Brachioradialis 2/2, biceps 2/2, triceps absent patellar absent, Achilles absent plantar responses were flexor bilaterally.  Gait and Station: Rising up from seated position by pushing on chair arm, cautious, dragging right leg  DIAGNOSTIC DATA (LABS, IMAGING, TESTING) -   ASSESSMENT AND PLAN 47 y.o. year old male  has a past medical history of  Intractable epilepsy  Doing very well with current extended release 300mg  2 tabs every night.   Vimpat to 200 mg 2 tablets twice a day,  Slow worsening gait abnormality,  Length dependent sensory changes, hyperreflexia of bilateral lower extremity, right intrinsic hand muscle atrophy, right distal arm weakness  Evidence of advanced cervical degenerative disease on MRI, moderate to severe multilevel foraminal narrowing  EMG nerve conduction study right cervical radiculopathy  MRI of lumbar spine  Refer him to neurosurgeon    Marcial Pacas, M.D. Ph.D.  [facility] [place], [place] [address] Phone: [phone] Fax:      [phone]

## 2018-11-13 ENCOUNTER — Telehealth: Payer: Self-pay | Admitting: *Deleted

## 2018-11-13 ENCOUNTER — Encounter: Payer: Self-pay | Admitting: *Deleted

## 2018-11-13 NOTE — Telephone Encounter (Signed)
SCAT application completed.  The patient's mother will pick it up.  It has been placed up front for her.  Patient needs letter stating he needs an escort to his doctor's appts. Requested letter written and faxed to [employer] [place] at [phone].  POA on file and medical release form signed.

## 2018-11-20 ENCOUNTER — Encounter (HOSPITAL_COMMUNITY): Payer: Self-pay

## 2018-11-20 ENCOUNTER — Emergency Department (HOSPITAL_COMMUNITY): Payer: Medicare Other

## 2018-11-20 ENCOUNTER — Emergency Department (HOSPITAL_COMMUNITY)
Admission: EM | Admit: 2018-11-20 | Discharge: 2018-11-20 | Disposition: A | Discharge status: Home / Self Care | Payer: Medicare Other | Attending: Emergency Medicine | Admitting: Emergency Medicine

## 2018-11-20 DIAGNOSIS — R4182 Altered mental status, unspecified: Secondary | ICD-10-CM | POA: Diagnosis not present

## 2018-11-20 DIAGNOSIS — G40909 Epilepsy, unspecified, not intractable, without status epilepticus: Secondary | ICD-10-CM | POA: Diagnosis not present

## 2018-11-20 DIAGNOSIS — R410 Disorientation, unspecified: Secondary | ICD-10-CM | POA: Diagnosis not present

## 2018-11-20 DIAGNOSIS — M6281 Muscle weakness (generalized): Secondary | ICD-10-CM | POA: Insufficient documentation

## 2018-11-20 DIAGNOSIS — R531 Weakness: Secondary | ICD-10-CM | POA: Diagnosis not present

## 2018-11-20 DIAGNOSIS — R0902 Hypoxemia: Secondary | ICD-10-CM | POA: Diagnosis not present

## 2018-11-20 DIAGNOSIS — M542 Cervicalgia: Secondary | ICD-10-CM | POA: Diagnosis not present

## 2018-11-20 DIAGNOSIS — Z79899 Other long term (current) drug therapy: Secondary | ICD-10-CM | POA: Diagnosis not present

## 2018-11-20 DIAGNOSIS — F17210 Nicotine dependence, cigarettes, uncomplicated: Secondary | ICD-10-CM | POA: Diagnosis not present

## 2018-11-20 DIAGNOSIS — R51 Headache: Secondary | ICD-10-CM | POA: Diagnosis not present

## 2018-11-20 DIAGNOSIS — I10 Essential (primary) hypertension: Secondary | ICD-10-CM | POA: Diagnosis not present

## 2018-11-20 DIAGNOSIS — R110 Nausea: Secondary | ICD-10-CM | POA: Diagnosis not present

## 2018-11-20 LAB — CBC
HCT: 48.5 % (ref 39.0–52.0)
Hemoglobin: 15.2 g/dL (ref 13.0–17.0)
MCH: 27.2 pg (ref 26.0–34.0)
MCHC: 31.3 g/dL (ref 30.0–36.0)
MCV: 86.8 fL (ref 80.0–100.0)
PLATELETS: 268 10*3/uL (ref 150–400)
RBC: 5.59 MIL/uL (ref 4.22–5.81)
RDW: 13.2 % (ref 11.5–15.5)
WBC: 6.2 10*3/uL (ref 4.0–10.5)
nRBC: 0 % (ref 0.0–0.2)

## 2018-11-20 LAB — DIFFERENTIAL
Abs Immature Granulocytes: 0.02 K/uL (ref 0.00–0.07)
Basophils Absolute: 0.1 K/uL (ref 0.0–0.1)
Basophils Relative: 1 %
Eosinophils Absolute: 0.2 K/uL (ref 0.0–0.5)
Eosinophils Relative: 3 %
Immature Granulocytes: 0 %
Lymphocytes Relative: 26 %
Lymphs Abs: 1.6 K/uL (ref 0.7–4.0)
Monocytes Absolute: 0.4 K/uL (ref 0.1–1.0)
Monocytes Relative: 7 %
Neutro Abs: 3.9 K/uL (ref 1.7–7.7)
Neutrophils Relative %: 63 %

## 2018-11-20 LAB — I-STAT CHEM 8, ED
BUN: 9 mg/dL (ref 6–20)
Calcium, Ion: 1.14 mmol/L — ABNORMAL LOW (ref 1.15–1.40)
Chloride: 108 mmol/L (ref 98–111)
Creatinine, Ser: 1.2 mg/dL (ref 0.61–1.24)
Glucose, Bld: 99 mg/dL (ref 70–99)
HCT: 49 % (ref 39.0–52.0)
Hemoglobin: 16.7 g/dL (ref 13.0–17.0)
Potassium: 3.9 mmol/L (ref 3.5–5.1)
Sodium: 140 mmol/L (ref 135–145)
TCO2: 23 mmol/L (ref 22–32)

## 2018-11-20 LAB — COMPREHENSIVE METABOLIC PANEL WITH GFR
ALT: 29 U/L (ref 0–44)
AST: 23 U/L (ref 15–41)
Albumin: 3.8 g/dL (ref 3.5–5.0)
Alkaline Phosphatase: 69 U/L (ref 38–126)
Anion gap: 11 (ref 5–15)
BUN: 7 mg/dL (ref 6–20)
CO2: 25 mmol/L (ref 22–32)
Calcium: 9.2 mg/dL (ref 8.9–10.3)
Chloride: 104 mmol/L (ref 98–111)
Creatinine, Ser: 1.32 mg/dL — ABNORMAL HIGH (ref 0.61–1.24)
GFR calc Af Amer: >60 mL/min
GFR calc non Af Amer: >60 mL/min
Glucose, Bld: 100 mg/dL — ABNORMAL HIGH (ref 70–99)
Potassium: 4 mmol/L (ref 3.5–5.1)
Sodium: 140 mmol/L (ref 135–145)
Total Bilirubin: 0.5 mg/dL (ref 0.3–1.2)
Total Protein: 7.4 g/dL (ref 6.5–8.1)

## 2018-11-20 LAB — URINALYSIS, ROUTINE W REFLEX MICROSCOPIC
Bacteria, UA: NONE SEEN
Bilirubin Urine: NEGATIVE
Glucose, UA: NEGATIVE mg/dL
Ketones, ur: NEGATIVE mg/dL
LEUKOCYTES UA: NEGATIVE
NITRITE: NEGATIVE
Protein, ur: 30 mg/dL — AB
Specific Gravity, Urine: 1.015 (ref 1.005–1.030)
pH: 7 (ref 5.0–8.0)

## 2018-11-20 LAB — APTT: aPTT: 28 seconds (ref 24–36)

## 2018-11-20 LAB — RAPID URINE DRUG SCREEN, HOSP PERFORMED
Amphetamines: NOT DETECTED
Barbiturates: NOT DETECTED
Benzodiazepines: NOT DETECTED
Cocaine: NOT DETECTED
Opiates: NOT DETECTED
Tetrahydrocannabinol: NOT DETECTED

## 2018-11-20 LAB — I-STAT TROPONIN, ED: TROPONIN I, POC: 0.01 ng/mL (ref 0.00–0.08)

## 2018-11-20 LAB — PROTIME-INR
INR: 1.03
PROTHROMBIN TIME: 13.4 s (ref 11.4–15.2)

## 2018-11-20 LAB — ETHANOL: Alcohol, Ethyl (B): <10 mg/dL

## 2018-11-20 MED ORDER — SODIUM CHLORIDE 0.9 % IV SOLN
INTRAVENOUS | Status: DC
  Administered 2018-11-20: 10:00:00 via INTRAVENOUS

## 2018-11-20 NOTE — Discharge Instructions (Addendum)
Follow-up with your neurologist and neurosurgeon as needed.  Today's work-up without any significant abnormalities return for any new or worse symptoms.

## 2018-11-20 NOTE — ED Provider Notes (Signed)
[facility] EMERGENCY DEPARTMENT Provider Note   CSN: [id number] Arrival date & time: 11/20/18  4010     History   Chief Complaint Chief Complaint  Patient presents with  . Altered Mental Status  . Weakness    HPI Andrew [name] is a 47 y.o. male.  Patient brought in by EMS.  Patient's mom called EMS because patient was acting altered.  Found lying in bed able to sit up states he is unable to move his arms.  Upon arrival here said he could also not move his legs.  EMS reported last known normal was at 5 this morning.  He has a history of seizures.  Patient had his morning meds which are 400 mg of Vimpat and 15 mg of buspirone.  These were given at about 5 this morning.  Approximately 1 hour later patient began acting different.  EMS reported patient was drooling.  He did have a mechanical fall at church yesterday and did hit the back of his head.  He has a chronic wound to the back of his head patient is to wear helmet.  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  Patient last seen in the emergency department November 24 for a fall.  Patient also seen November 3 for muscle weakness.  Last time seen in the emergency department for seizure was January 2018.  Patient seen by me shortly after arrival.  To make sure that he was not code stroke patient.  Patient able to move his toes and fingers and raise his arms up but they both would drop very slowly back to the gurney.  Presentation not necessarily consistent with acute stroke symptoms.  Patient may have had a recurrent seizure.  Patient also has past medical history significant for depression and anxiety.  Patient initially had his eyes closed but then went open them at times and they would quiver some.     Past Medical History:  Diagnosis Date  . Anxiety   . Depression   . Seizures [facility])     Patient Active Problem List   Diagnosis Date Noted  . Cervical stenosis of spinal canal 10/04/2018  . Gait abnormality  10/04/2018  . Partial symptomatic epilepsy with complex partial seizures, not intractable, without status epilepticus ([place]) 12/24/2016  . Bipolar disorder ([place]) 12/24/2016  . Seizures ([place])   . Anxiety     Past Surgical History:  Procedure Laterality Date  . none          Home Medications    Prior to Admission medications   Medication Sig Start Date End Date Taking? Authorizing Provider  busPIRone (BUSPAR) 15 MG tablet Take 15 mg by mouth 3 (three) times daily.    Yes [provider]  lacosamide (VIMPAT) 200 MG TABS tablet Take 2 tablets (400 mg total) by mouth 2 (two) times daily. 07/20/18  Yes Marcial Pacas, MD  LamoTRIgine 300 MG TB24 24 hour tablet Take 2 tablets (600 mg total) by mouth at bedtime. 07/20/18  Yes Marcial Pacas, MD    Family History Family History  Problem Relation Age of Onset  . Asthma Father     Social History Social History   Tobacco Use  . Smoking status: Current Every Day Smoker    Packs/day: 0.50    Years: 20.00    Pack years: 10.00    Types: Cigarettes  . Smokeless tobacco: Never Used  Substance Use Topics  . Alcohol use: No    Alcohol/week: 0.0  standard drinks  . Drug use: No    Comment: 11/08/16 - reports he has stopped smoking marijuana.     Allergies   Patient has no known allergies.   Review of Systems Review of Systems  Unable to perform ROS: Mental status change     Physical Exam Updated Vital Signs BP 113/66   Pulse 69   Temp 98.5 F (36.9 C) (Oral)   Resp 14   Ht 1.778 m (5\' 10" )   Wt 104.3 kg   SpO2 99%   BMI 33.00 kg/m   Physical Exam Vitals signs and nursing note reviewed.  Constitutional:      Appearance: Normal appearance.  HENT:     Head:     Comments: Patient with chronic wound to the occiput part of his skull with some slight bleeding.    Mouth/Throat:     Mouth: Mucous membranes are moist.  Eyes:     Extraocular Movements: Extraocular movements intact.     Conjunctiva/sclera: Conjunctivae  normal.     Pupils: Pupils are equal, round, and reactive to light.  Neck:     Musculoskeletal: Normal range of motion and neck supple.  Cardiovascular:     Rate and Rhythm: Normal rate and regular rhythm.  Pulmonary:     Effort: Pulmonary effort is normal. No respiratory distress.     Breath sounds: Normal breath sounds.  Abdominal:     General: Bowel sounds are normal.     Palpations: Abdomen is soft.     Tenderness: There is no abdominal tenderness.  Musculoskeletal:     Comments: Limited range of motion in all 4 extremities.  But will raise his arms up and will move his toes.  Skin:    General: Skin is warm.     Capillary Refill: Capillary refill takes less than 2 seconds.  Neurological:     Comments: Patient's presentation initially seemed to have weakness in upper extremities but could hold them up and then they would slowly go back down to the gurney.  Not consistent with true weakness.  Patient did not want to move his lower extremities but would move his toes.  Patient would respond to some questions.      ED Treatments / Results  Labs (all labs ordered are listed, but only abnormal results are displayed) Labs Reviewed  COMPREHENSIVE METABOLIC PANEL - Abnormal; Notable for the following components:      Result Value   Glucose, Bld 100 (*)    Creatinine, Ser 1.32 (*)    All other components within normal limits  URINALYSIS, ROUTINE W REFLEX MICROSCOPIC - Abnormal; Notable for the following components:   Hgb urine dipstick MODERATE (*)    Protein, ur 30 (*)    All other components within normal limits  I-STAT CHEM 8, ED - Abnormal; Notable for the following components:   Calcium, Ion 1.14 (*)    All other components within normal limits  ETHANOL  PROTIME-INR  APTT  CBC  DIFFERENTIAL  RAPID URINE DRUG SCREEN, HOSP PERFORMED  I-STAT TROPONIN, ED    EKG EKG Interpretation  Date/Time:  Monday November 20 2018 08:54:49 EST Ventricular Rate:  84 PR Interval:      QRS Duration: 95 QT Interval:  379 QTC Calculation: 448 R Axis:   3 Text Interpretation:  Sinus rhythm Borderline prolonged PR interval ST elev, probable normal early repol pattern No significant change since last tracing Confirmed by Fredia Sorrow [phone]) on 11/20/2018 8:58:02 AM Also confirmed by  Fredia Sorrow [phone]), editor Philomena Doheny [phone])  on 11/20/2018 1:12:56 PM   Radiology Ct Head Wo Contrast  Result Date: 11/20/2018 CLINICAL DATA:  47 year old male with history of trauma. Head and neck pain. EXAM: CT HEAD WITHOUT CONTRAST CT CERVICAL SPINE WITHOUT CONTRAST TECHNIQUE: Multidetector CT imaging of the head and cervical spine was performed following the standard protocol without intravenous contrast. Multiplanar CT image reconstructions of the cervical spine were also generated. COMPARISON:  Head and Cervical Spine CT 09/24/2018. FINDINGS: CT HEAD FINDINGS Brain: Severe cerebellar atrophy, similar to the prior examination. No evidence of acute infarction, hemorrhage, hydrocephalus, extra-axial collection or mass lesion/mass effect. Vascular: No hyperdense vessel or unexpected calcification. Skull: Normal. Negative for fracture or focal lesion. Sinuses/Orbits: Small low-attenuation air-fluid levels lying dependently in the maxillary sinuses bilaterally. Mild multifocal mucosal thickening in the ethmoid sinuses bilaterally. Other: None. CT CERVICAL SPINE FINDINGS Alignment: Mild reversal of normal cervical lordosis centered at the level of C4-C5, similar to the prior study, presumably related to chronic degenerative disease. Alignment is otherwise anatomic. Skull base and vertebrae: No acute fracture. No primary bone lesion or focal pathologic process. Soft tissues and spinal canal: No prevertebral fluid or swelling. No visible canal hematoma. Disc levels: Severe multilevel degenerative disc disease, most pronounced at C4-C5, C5-C6, C6-C7 and C7-T1. Mild multilevel facet arthropathy.  Upper chest: Unremarkable. Other: None. IMPRESSION: 1. No evidence of significant acute traumatic injury to the skull, brain or cervical spine. 2. Severe cerebellar atrophy again noted. 3. Severe multilevel degenerative disc disease and cervical spondylosis, similar to the prior study, as above. Electronically Signed   By: Vinnie Langton M.D.   On: 11/20/2018 11:03   Ct Cervical Spine Wo Contrast  Result Date: 11/20/2018 CLINICAL DATA:  47 year old male with history of trauma. Head and neck pain. EXAM: CT HEAD WITHOUT CONTRAST CT CERVICAL SPINE WITHOUT CONTRAST TECHNIQUE: Multidetector CT imaging of the head and cervical spine was performed following the standard protocol without intravenous contrast. Multiplanar CT image reconstructions of the cervical spine were also generated. COMPARISON:  Head and Cervical Spine CT 09/24/2018. FINDINGS: CT HEAD FINDINGS Brain: Severe cerebellar atrophy, similar to the prior examination. No evidence of acute infarction, hemorrhage, hydrocephalus, extra-axial collection or mass lesion/mass effect. Vascular: No hyperdense vessel or unexpected calcification. Skull: Normal. Negative for fracture or focal lesion. Sinuses/Orbits: Small low-attenuation air-fluid levels lying dependently in the maxillary sinuses bilaterally. Mild multifocal mucosal thickening in the ethmoid sinuses bilaterally. Other: None. CT CERVICAL SPINE FINDINGS Alignment: Mild reversal of normal cervical lordosis centered at the level of C4-C5, similar to the prior study, presumably related to chronic degenerative disease. Alignment is otherwise anatomic. Skull base and vertebrae: No acute fracture. No primary bone lesion or focal pathologic process. Soft tissues and spinal canal: No prevertebral fluid or swelling. No visible canal hematoma. Disc levels: Severe multilevel degenerative disc disease, most pronounced at C4-C5, C5-C6, C6-C7 and C7-T1. Mild multilevel facet arthropathy. Upper chest: Unremarkable.  Other: None. IMPRESSION: 1. No evidence of significant acute traumatic injury to the skull, brain or cervical spine. 2. Severe cerebellar atrophy again noted. 3. Severe multilevel degenerative disc disease and cervical spondylosis, similar to the prior study, as above. Electronically Signed   By: Vinnie Langton M.D.   On: 11/20/2018 11:03    Procedures Procedures (including critical care time)  Medications Ordered in ED Medications  0.9 %  sodium chloride infusion ( Intravenous New Bag/Given 11/20/18 1001)     Initial Impression / Assessment and Plan / ED  Course  I have reviewed the triage vital signs and the nursing notes.  Pertinent labs & imaging results that were available during my care of the patient were reviewed by me and considered in my medical decision making (see chart for details).    Patient's initial presentation suggestive may be recurrent seizure some postictal changes may be some psychosomatic changes.  Work-up without any acute findings.  Patient observed for a prolonged period of time patient got back to normal baseline.  And was safe for discharge back home.  Patient was observed for a long period of time.  Feel that patient probably had a seizure had some postictal and had some psychosomatic neurological presentation.  Patient never had any hard-core focal deficits.  In addition in relation to the fall at church no acute traumatic findings.   Final Clinical Impressions(s) / ED Diagnoses   Final diagnoses:  Altered mental status, unspecified altered mental status type  Weakness    ED Discharge Orders    None       Fredia Sorrow, MD 11/27/18 1219

## 2018-11-20 NOTE — ED Notes (Signed)
Patient transported to CT 

## 2018-11-20 NOTE — ED Notes (Addendum)
Pt now c/o cp that he said just started; described as burning "all the way across"; MD aware

## 2018-11-20 NOTE — ED Triage Notes (Signed)
Pt from home via ems; pts mom called ems,pt acting altered; found laying in bed, unable to sit up, states he is unable to move arms, legs; LKW 0500; hx seizures; pt given meds (400 mg vimpat, 15 mg buspirone) at 0500 this am; approx 1 hour after meds given, pt began "acting different"; pt drooling w/ ems; pt had a mechanical fall yesterday at church, hit head, not on blood thinners; pt uses walker d/t spinal arthritis  180/132 HR 90 96% RA RR 18 CBG 118

## 2019-03-05 ENCOUNTER — Telehealth: Payer: Self-pay | Admitting: Neurology

## 2019-03-05 NOTE — Telephone Encounter (Signed)
Sander Nephew, patient's mother on DPR, consented to telephone visit.  She informed the phone staff they are unable to complete a video visit.  He has a pending appt on 03/14/2019 at 11am.

## 2019-03-05 NOTE — Telephone Encounter (Signed)
Pt is wanting a Telehealth Consult on 03/14/2019 instead of coming in office

## 2019-03-14 ENCOUNTER — Other Ambulatory Visit: Payer: Self-pay

## 2019-03-14 ENCOUNTER — Ambulatory Visit (INDEPENDENT_AMBULATORY_CARE_PROVIDER_SITE_OTHER): Payer: Medicare Other | Admitting: Neurology

## 2019-03-14 ENCOUNTER — Encounter: Payer: Self-pay | Admitting: Neurology

## 2019-03-14 DIAGNOSIS — R269 Unspecified abnormalities of gait and mobility: Secondary | ICD-10-CM

## 2019-03-14 DIAGNOSIS — R569 Unspecified convulsions: Secondary | ICD-10-CM | POA: Diagnosis not present

## 2019-03-14 DIAGNOSIS — M4802 Spinal stenosis, cervical region: Secondary | ICD-10-CM

## 2019-03-14 NOTE — Progress Notes (Signed)
[facility]  PATIENT: Andrew [name] DOB: 10-Feb-1971  HISTORY OF PRESENT ILLNESS:[name], 48 year old male returns for followup. He is with his mother.He has a history of intractable seizure disorder since 48 years old. He has been followed in the office since 1990. 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  He has had EMU monitoring at [facility] in the past that included abnormal baseline EEG due to intermittent predominantly right temporal spike, sharp right,and focal slowing. He continues to refuse surgery to remove irritable focus.  Over the years, he has tried different combination, he has been on current medications  Topamax 100 mg twice a day, +50 mg twice a day, Dilantin 100 mg 2 tablets twice a day, Keppra 500 mg 3 tablets b.i.d, brand name  He is also taking Buspirone 10 mg t.i.d., citalopram 20 mg every day for his depression and angry control prescribed by psychiatry.  He continues to have 2-3 seizures every day, complex partial with secondary generalization, he had burst open few helmet due to his seizure, he continued to smoke marijuana and cigarette. He helps his mother taking care of his elderly bed ridden grandmother at home, has raging spells sometimes.   Vimpat 150 twice a day was added since 2015,   He still has seizure almost daily,  He is now taking Topamax 100 plus 50 mg twice a day , Dilantin 200 mg twice a day, Keppra 500 mg 3 tablets twice a day, Vimpat 150 mg twice a day  UPDATE February 07 2017: He came here urgently in early February 2018 because bilateral hands shaking, he was given the prescription of Vraylar, his shaking has much improved after stop the medication.  He is going through dental procedure, his mother had POA, they agreed to proceed with VNS  UPDATE June 09 2017: His seizure overall has much improved, is no longer having a daily basis, is tolerating current medications, but yesterday on June 08 2017, he  had few recurrent seizures, he is still going through dental procedures,  He is taking Keppra 500 mg 3 tablets, Vimpat 150 mg 2 tablets, topiramate 100 mg 2 tablets twice a day  UPDATE Dec 12 2017: His seizure overall has improved some, he also goes to [facility], taking buspiprone, which has helped his mood some,  Continue have frequent migraine headaches, 3-4 times each week, sometimes multiple episode in the day,  He smokes cigarettes some, mother has convinced him to stop use marijuana.  He continue have significant depression, after discussed with mother and patient, we decided to switch Keppra to lamotrigine,  We also talked about VNS placement, he wants to hold off   UPDATE March 20 2018: He is doing much better now with current combinations, lamotrigine 100 mg 2 tablets twice a day, Vimpat 150 mg 2 tablets twice a day, Topamax 100 mg 2 tablets twice a day  Only few recurrent seizure, much better than his previous baseline his mood has improved as well  UPDATE July 20 2018: He is doing much better now with current combination of Vimpat 150 mg 2 tablets twice a day, lamotrigine 100 mg 3 tablets twice a day, Topamax 100 mg twice a day, he only has seizure once or twice each week, they are short lasting, sometimes without recurrent seizure in 1 week, this is in contrast to previous daily multiple seizure episode,  But he complains of dizziness, lightheadedness after each dose of medications,  His depression has much improved too, is more  compliant with his mother  EEG in April 2019 showed generalized epileptiform discharge in the background of mild dysrhythmic slowing.  Update October 04, 2018 He is accompanied by his mother at today's visit, he presented to emergency room on September 24, 2018 after found falling down at home, he has no recollection of the event, mother thought he might have a seizure, his seizure overall is under excellent control, tolerating  lamotrigine XR 300 mg 2 tablets at nighttime, Vimpat 2 tablets twice a day, his mood is also under good control.  I personally reviewed MRI of brain: Advanced atrophy of the cerebellum, likely secondary to chronic antiepileptic medication MRI of cervical spine: Multilevel degenerative changes, moderate to severe foraminal narrowing at C3-4, C7-T1, secondary to multilevel uncovertebral disease, there is no evidence of canal stenosis.  Patient complains slow worsening gait abnormality over the past few months, urinary frequency, urgency, chronic low back [name], also mild neck [name] radiating [name] to right hand, arm  Virtual Visit via Phone  I connected with Andrew [name] on 03/14/19 at  by phone and verified that I am speaking with the correct person using two identifiers.   I discussed the limitations, risks, security and privacy concerns of performing an evaluation and management service by phone and the availability of in person appointments. I also discussed with the patient that there may be a patient responsible charge related to this service. The patient expressed understanding and agreed to proceed.   History of Present Illness: I interviewed patient and his mother, his seizure is under very good control, only had 3 seizure and few staring spells over past 4 months.  He continues to have gait abnormalities, using walker, right arm weakness, no incontinence.  EMG/NCS on Nov 10 2018: showed evidence of active right cervical radiculopathy, involving right C7,8, T1, moderate C6 myotomes.   He was seen by neurosurgeon, not a surgical candidate.    Observations/Objective: I have reviewed problem lists, medications, allergies.  I personally reviewed MRI lumbar in Nov 2019: multiple level degenerative changes, no significant canal or foraminal stenosis.  Assessment and Plan: Intractable epilepsy  Doing very well with current extended release Lamotrigine 300mg  2 tabs every night.   Vimpat  200 mg 2 tablets twice a day,  Slow worsening gait abnormality, right arm weakness  Length dependent sensory changes, hyperreflexia of bilateral lower extremity, right intrinsic hand muscle atrophy, right distal arm weakness  Evidence of advanced cervical degenerative disease on MRI, moderate to severe multilevel foraminal narrowing  Not a surgical candidate.  Continue moderate exercise.    Follow Up Instructions:     I discussed the assessment and treatment plan with the patient. The patient was provided an opportunity to ask questions and all were answered. The patient agreed with the plan and demonstrated an understanding of the instructions.   The patient was advised to call back or seek an in-person evaluation if the symptoms worsen or if the condition fails to improve as anticipated.  I provided 20 minutes of non-face-to-face time during this encounter.   Marcial Pacas, MD  Intractable epilepsy  Doing very well with current extended release 300mg  2 tabs every night.   Vimpat to 200 mg 2 tablets twice a day,  Slow worsening gait abnormality,  Length dependent sensory changes, hyperreflexia of bilateral lower extremity, right intrinsic hand muscle atrophy, right distal arm weakness  Evidence of advanced cervical degenerative disease on MRI, moderate to severe multilevel foraminal narrowing  EMG nerve conduction study right  cervical radiculopathy  MRI of lumbar spine  Refer him to neurosurgeon     REVIEW OF SYSTEMS: Full 14 system review of systems performed and notable only for those listed, all others are neg:  Excessive sweating, eye redness, blurred vision, black stool, snoring, frequent urination, difficulty urinating, walking difficulty, dizziness, numbness, seizure, weakness  ROS review of the system were negative   ALLERGIES: No Known Allergies  HOME MEDICATIONS: Outpatient Medications Prior to Visit  Medication Sig Dispense Refill  . busPIRone (BUSPAR) 15 MG  tablet Take 15 mg by mouth 3 (three) times daily.     Marland Kitchen lacosamide (VIMPAT) 200 MG TABS tablet Take 2 tablets (400 mg total) by mouth 2 (two) times daily. 360 tablet 4  . LamoTRIgine 300 MG TB24 24 hour tablet Take 2 tablets (600 mg total) by mouth at bedtime. 180 tablet 4   No facility-administered medications prior to visit.     PAST MEDICAL HISTORY: Past Medical History:  Diagnosis Date  . Anxiety   . Depression   . Seizures ([place])     PAST SURGICAL HISTORY: Past Surgical History:  Procedure Laterality Date  . none      FAMILY HISTORY: Family History  Problem Relation Age of Onset  . Asthma Father     SOCIAL HISTORY: Social History   Socioeconomic History  . Marital status: Single    Spouse name: Not on file  . Number of children: 0  . Years of education: 67  . Highest education level: Not on file  Occupational History    Comment: Disabled  Social Needs  . Financial resource strain: Not on file  . Food insecurity:    Worry: Not on file    Inability: Not on file  . Transportation needs:    Medical: Not on file    Non-medical: Not on file  Tobacco Use  . Smoking status: Current Every Day Smoker    Packs/day: 0.50    Years: 20.00    Pack years: 10.00    Types: Cigarettes  . Smokeless tobacco: Never Used  Substance and Sexual Activity  . Alcohol use: No    Alcohol/week: 0.0 standard drinks  . Drug use: No    Comment: 11/08/16 - reports he has stopped smoking marijuana.  . Sexual activity: Not on file  Lifestyle  . Physical activity:    Days per week: Not on file    Minutes per session: Not on file  . Stress: Not on file  Relationships  . Social connections:    Talks on phone: Not on file    Gets together: Not on file    Attends religious service: Not on file    Active member of club or organization: Not on file    Attends meetings of clubs or organizations: Not on file    Relationship status: Not on file  . Intimate partner violence:    Fear of  current or ex partner: Not on file    Emotionally abused: Not on file    Physically abused: Not on file    Forced sexual activity: Not on file  Other Topics Concern  . Not on file  Social History Narrative   Patient lives at home with his mother Andrew [name] ). Patient is disabled. Patient has high school education.   Left handed.   Caffeine- soda - pepsi four cans daily.   Patients father died of over dose.-40     PHYSICAL EXAM  There were no vitals filed for  this visit. There is no height or weight on file to calculate BMI. Generalized: Well developed, in no acute distress, wear helmet Head: normocephalic and atraumatic,. gingival hypertrophy, wearing helmet  Neck: Supple, no carotid bruits  Musculoskeletal: No deformity  Neurological examination  Mentation: Alert oriented to time, place, history taking. Follows most commands speech and language fluent  Cranial nerve II-XII: Pupils were equal round reactive to light extraocular movements were full, visual field were full on confrontational test. Facial sensation and strength were normal. hearing was intact to finger rubbing bilaterally. Uvula tongue midline. head turning and shoulder shrug were normal and symmetric.Tongue protrusion into cheek strength was normal.  Motor: Right hand intrinsic muscle atrophy, mild weakness at right elbow extension, shoulder external rotation, wrist flexion, extension, grip, moderate weakness on finger abduction Coordination: finger-nose-finger, heel-to-shin bilaterally, no dysmetria  Reflexes: Brachioradialis 2/2, biceps 2/2, triceps absent patellar absent, Achilles absent plantar responses were flexor bilaterally.  Gait and Station: Rising up from seated position by pushing on chair arm, cautious, dragging right leg  DIAGNOSTIC DATA (LABS, IMAGING, TESTING) -   ASSESSMENT AND PLAN 48 y.o. year old male  has a past medical history of    Marcial Pacas, M.D. Ph.D.  [facility] [place], [place] [address] Phone: [phone] Fax:      [phone]

## 2019-05-09 ENCOUNTER — Telehealth: Payer: Self-pay | Admitting: Neurology

## 2019-05-09 NOTE — Telephone Encounter (Signed)
Called and left message to call back to schedule a follow up with Dr. Krista Blue in August.

## 2019-06-07 DIAGNOSIS — F70 Mild intellectual disabilities: Secondary | ICD-10-CM | POA: Diagnosis not present

## 2019-06-07 DIAGNOSIS — F918 Other conduct disorders: Secondary | ICD-10-CM | POA: Diagnosis not present

## 2019-06-07 DIAGNOSIS — F0631 Mood disorder due to known physiological condition with depressive features: Secondary | ICD-10-CM | POA: Diagnosis not present

## 2019-06-07 DIAGNOSIS — F331 Major depressive disorder, recurrent, moderate: Secondary | ICD-10-CM | POA: Diagnosis not present

## 2019-06-07 DIAGNOSIS — F419 Anxiety disorder, unspecified: Secondary | ICD-10-CM | POA: Diagnosis not present

## 2019-07-06 ENCOUNTER — Other Ambulatory Visit: Payer: Self-pay | Admitting: Neurology

## 2019-07-06 NOTE — Telephone Encounter (Signed)
Pt is needing a refill on his lacosamide (VIMPAT) 200 MG TABS tablet sent to the [facility] on [address]

## 2019-07-09 MED ORDER — LACOSAMIDE 200 MG PO TABS: 400.0000 mg | ORAL_TABLET | Freq: Two times a day (BID) | ORAL | 1 refills | Status: DC

## 2019-07-10 ENCOUNTER — Telehealth: Payer: Self-pay | Admitting: Neurology

## 2019-07-10 ENCOUNTER — Ambulatory Visit: Payer: Medicare Other | Admitting: Neurology

## 2019-07-10 NOTE — Telephone Encounter (Signed)
Pt mother is asking for a call from RN before pt's 2:30 appointment today.  She states pt told her that he is able to move his body and mother wants to know what RN would suggest.  Please call

## 2019-07-10 NOTE — Telephone Encounter (Signed)
I returned the call to his mother.  Stated he was up around 6am with no problems.  He developed some generalized weakness, mostly in bilateral legs, around 9am and required the use of a walker for ambulation.  His mother says this has happened several times in the past and the weakness always improves with rest.  She says he is already feeling better and she would like to keep the appt this afternoon.

## 2019-07-12 ENCOUNTER — Encounter: Payer: Self-pay | Admitting: Neurology

## 2019-07-12 ENCOUNTER — Ambulatory Visit (INDEPENDENT_AMBULATORY_CARE_PROVIDER_SITE_OTHER): Payer: Medicare Other | Admitting: Neurology

## 2019-07-12 ENCOUNTER — Other Ambulatory Visit: Payer: Self-pay

## 2019-07-12 VITALS — BP 158/72 | HR 76 | Temp 98.3°F | Ht 70.0 in | Wt 223.0 lb

## 2019-07-12 DIAGNOSIS — R569 Unspecified convulsions: Secondary | ICD-10-CM

## 2019-07-12 DIAGNOSIS — R531 Weakness: Secondary | ICD-10-CM

## 2019-07-12 DIAGNOSIS — R269 Unspecified abnormalities of gait and mobility: Secondary | ICD-10-CM | POA: Diagnosis not present

## 2019-07-12 MED ORDER — LACOSAMIDE 200 MG PO TABS: 400.0000 mg | ORAL_TABLET | Freq: Two times a day (BID) | ORAL | 4 refills | Status: DC

## 2019-07-12 MED ORDER — LAMOTRIGINE ER 300 MG PO TB24: 600.0000 mg | ORAL_TABLET | Freq: Every day | ORAL | 4 refills | Status: DC

## 2019-07-12 NOTE — Progress Notes (Signed)
[facility]  PATIENT: Andrew [name] DOB: 06-01-1971  HISTORY OF PRESENT ILLNESS:[name], 48 year old male returns for followup. He is with his mother.He has a history of intractable seizure disorder since 48 years old. He has been followed in the office since 1990. 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  He has had EMU monitoring at [facility] in the past that included abnormal baseline EEG due to intermittent predominantly right temporal spike, sharp right,and focal slowing. He continues to refuse surgery to remove irritable focus.  Over the years, he has tried different combination, he has been on current medications  Topamax 100 mg twice a day, +50 mg twice a day, Dilantin 100 mg 2 tablets twice a day, Keppra 500 mg 3 tablets b.i.d, brand name  He is also taking Buspirone 10 mg t.i.d., citalopram 20 mg every day for his depression and angry control prescribed by psychiatry.  He continues to have 2-3 seizures every day, complex partial with secondary generalization, he had burst open few helmet due to his seizure, he continued to smoke marijuana and cigarette. He helps his mother taking care of his elderly bed ridden grandmother at home, has raging spells sometimes.   Vimpat 150 twice a day was added since 2015,   He still has seizure almost daily,  He is now taking Topamax 100 plus 50 mg twice a day , Dilantin 200 mg twice a day, Keppra 500 mg 3 tablets twice a day, Vimpat 150 mg twice a day  UPDATE February 07 2017: He came here urgently in early February 2018 because bilateral hands shaking, he was given the prescription of Vraylar, his shaking has much improved after stop the medication.  He is going through dental procedure, his mother had POA, they agreed to proceed with VNS  UPDATE June 09 2017: His seizure overall has much improved, is no longer having a daily basis, is tolerating current medications, but yesterday on June 08 2017, he  had few recurrent seizures, he is still going through dental procedures,  He is taking Keppra 500 mg 3 tablets, Vimpat 150 mg 2 tablets, topiramate 100 mg 2 tablets twice a day  UPDATE Dec 12 2017: His seizure overall has improved some, he also goes to [facility], taking buspiprone, which has helped his mood some,  Continue have frequent migraine headaches, 3-4 times each week, sometimes multiple episode in the day,  He smokes cigarettes some, mother has convinced him to stop use marijuana.  He continue have significant depression, after discussed with mother and patient, we decided to switch Keppra to lamotrigine,  We also talked about VNS placement, he wants to hold off   UPDATE March 20 2018: He is doing much better now with current combinations, lamotrigine 100 mg 2 tablets twice a day, Vimpat 150 mg 2 tablets twice a day, Topamax 100 mg 2 tablets twice a day  Only few recurrent seizure, much better than his previous baseline his mood has improved as well  UPDATE July 20 2018: He is doing much better now with current combination of Vimpat 150 mg 2 tablets twice a day, lamotrigine 100 mg 3 tablets twice a day, Topamax 100 mg twice a day, he only has seizure once or twice each week, they are short lasting, sometimes without recurrent seizure in 1 week, this is in contrast to previous daily multiple seizure episode,  But he complains of dizziness, lightheadedness after each dose of medications,  His depression has much improved too, is more  compliant with his mother  EEG in April 2019 showed generalized epileptiform discharge in the background of mild dysrhythmic slowing.  Update October 04, 2018 He is accompanied by his mother at today's visit, he presented to emergency room on September 24, 2018 after found falling down at home, he has no recollection of the event, mother thought he might have a seizure, his seizure overall is under excellent control, tolerating  lamotrigine XR 300 mg 2 tablets at nighttime, Vimpat 2 tablets twice a day, his mood is also under good control.  I personally reviewed MRI of brain: Advanced atrophy of the cerebellum, likely secondary to chronic antiepileptic medication MRI of cervical spine: Multilevel degenerative changes, moderate to severe foraminal narrowing at C3-4, C7-T1, secondary to multilevel uncovertebral disease, there is no evidence of canal stenosis.  Patient complains slow worsening gait abnormality over the past few months, urinary frequency, urgency, chronic low back [name], also mild neck [name] radiating [name] to right hand, arm  Virtual Visit via Phone March 14 2019: I interviewed patient and his mother, his seizure is under very good control, only had 3 seizure and few staring spells over past 4 months.  He continues to have gait abnormalities, using walker, right arm weakness, no incontinence.  EMG/NCS on Nov 10 2018: showed evidence of active right cervical radiculopathy, involving right C7,8, T1, moderate C6 myotomes.   He was seen by neurosurgeon, not a surgical candidate.   UPDATE July 12 2019: He is doing very well from a seizure standpoint with current combination lamotrigine ER 300 mg 2 tablets at bedtime, Vimpat 200 mg 2 tablets twice a day, he had minor staring spells about once a week, rarely has generalized seizures  Continue have significant right hand weakness, but not progressing, continue to have gait abnormality,  Mother reported few episode of sudden onset generalized weakness, could not walk, left arm against gravity, most recent episode was July 10, 2019, he has got up taking his morning medications, he noticed sudden onset weakness, he was able to call mother to check on it, episode lasted about few hours, gradually improved  Reviewed emergency room presentation on November 20 2018, similar episode on September 24, 2018, during the spells, he was able to move his toes and fingers, but  very slowly, laboratory evaluations December showed negative troponin, normal CMP, with glucose of 99, normal CBC,  REVIEW OF SYSTEMS: Full 14 system review of systems performed and notable only for those listed, all others are neg:  As above   ALLERGIES: No Known Allergies  HOME MEDICATIONS: Outpatient Medications Prior to Visit  Medication Sig Dispense Refill   busPIRone (BUSPAR) 15 MG tablet Take 15 mg by mouth 3 (three) times daily.      lacosamide (VIMPAT) 200 MG TABS tablet Take 2 tablets (400 mg total) by mouth 2 (two) times daily. 360 tablet 1   LamoTRIgine 300 MG TB24 24 hour tablet Take 2 tablets (600 mg total) by mouth at bedtime. 180 tablet 4   No facility-administered medications prior to visit.     PAST MEDICAL HISTORY: Past Medical History:  Diagnosis Date   Anxiety    Depression    Seizures ([place])     PAST SURGICAL HISTORY: Past Surgical History:  Procedure Laterality Date   none      FAMILY HISTORY: Family History  Problem Relation Age of Onset   Asthma Father     SOCIAL HISTORY: Social History   Socioeconomic History   Marital status: Single  Spouse name: Not on file   Number of children: 0   Years of education: 12   Highest education level: Not on file  Occupational History    Comment: Disabled  Scientist, product/process development strain: Not on file   Food insecurity    Worry: Not on file    Inability: Not on file   Transportation needs    Medical: Not on file    Non-medical: Not on file  Tobacco Use   Smoking status: Current Every Day Smoker    Packs/day: 0.50    Years: 20.00    Pack years: 10.00    Types: Cigarettes   Smokeless tobacco: Never Used  Substance and Sexual Activity   Alcohol use: No    Alcohol/week: 0.0 standard drinks   Drug use: No    Comment: 11/08/16 - reports he has stopped smoking marijuana.   Sexual activity: Not on file  Lifestyle   Physical activity    Days per week: Not on file     Minutes per session: Not on file   Stress: Not on file  Relationships   Social connections    Talks on phone: Not on file    Gets together: Not on file    Attends religious service: Not on file    Active member of club or organization: Not on file    Attends meetings of clubs or organizations: Not on file    Relationship status: Not on file   Intimate partner violence    Fear of current or ex partner: Not on file    Emotionally abused: Not on file    Physically abused: Not on file    Forced sexual activity: Not on file  Other Topics Concern   Not on file  Social History Narrative   Patient lives at home with his mother Andrew [name] ). Patient is disabled. Patient has high school education.   Left handed.   Caffeine- soda - pepsi four cans daily.   Patients father died of over dose.-40     PHYSICAL EXAM  Vitals:   07/12/19 0914  BP: (!) 158/72  Pulse: 76  Temp: 98.3 F (36.8 C)  Weight: 223 lb (101.2 kg)  Height: 5\' 10"  (1.778 m)   Body mass index is 32 kg/m. Generalized: Well developed, in no acute distress, wear helmet Head: normocephalic and atraumatic,. gingival hypertrophy, wearing helmet  Neck: Supple, no carotid bruits  Musculoskeletal: No deformity  Neurological examination  Mentation: Alert oriented to time, place, history taking. Follows most commands speech and language fluent  Cranial nerve II-XII: Pupils were equal round reactive to light extraocular movements were full, visual field were full on confrontational test. Facial sensation and strength were normal. hearing was intact to finger rubbing bilaterally. Uvula tongue midline. head turning and shoulder shrug were normal and symmetric.Tongue protrusion into cheek strength was normal.  Motor: Right hand intrinsic muscle atrophy, mild weakness at right elbow extension, shoulder external rotation, wrist flexion, extension, grip, moderate weakness on finger abduction Coordination:  finger-nose-finger, heel-to-shin bilaterally, no dysmetria  Reflexes: Brachioradialis 2/2, biceps 2/2, triceps absent patellar absent, Achilles absent plantar responses were flexor bilaterally.  Gait and Station: Rising up from seated position by pushing on chair arm, cautious, unsteady dragging right leg  DIAGNOSTIC DATA (LABS, IMAGING, TESTING) -   ASSESSMENT AND PLAN Intractable epilepsy  Doing very well with current extended release lamotrigine 300mg  2 tabs every night.   Vimpat  200 mg 2 tablets twice a  day,  Slow worsening gait abnormality,  Length dependent sensory changes, hyperreflexia of bilateral lower extremity, right intrinsic hand muscle atrophy, right distal arm weakness  Evidence of advanced cervical degenerative disease on MRI, moderate to severe multilevel foraminal narrowing  EMG nerve conduction study reviewed right cervical radiculopathy  MRI of lumbar spine showed multilevel degenerative changes, but there was no significant canal stenosis or foraminal narrowing  He was seen by neuro surgeon, not a surgical candidate  Will continue monitoring, may consider repeat EMG nerve conduction study if his symptoms continue to worsen  Recurrent episode of generalized weakness  The description does not suggestive of seizure, differentiation diagnoses also include hypoglycemia, blood pressure variations,  Laboratory evaluations     Marcial Pacas, M.D. Ph.D.  [facility] [place], [place] [address] Phone: [phone] Fax:      [phone]

## 2019-07-14 LAB — HEMOGLOBIN A1C
Est. average glucose Bld gHb Est-mCnc: 100 mg/dL
Hgb A1c MFr Bld: 5.1 % (ref 4.8–5.6)

## 2019-07-14 LAB — CBC WITH DIFFERENTIAL
Basophils Absolute: 0.1 10*3/uL (ref 0.0–0.2)
Basos: 1 %
EOS (ABSOLUTE): 0.2 10*3/uL (ref 0.0–0.4)
Eos: 2 %
Hematocrit: 50.6 % (ref 37.5–51.0)
Hemoglobin: 16.6 g/dL (ref 13.0–17.7)
Immature Grans (Abs): 0 10*3/uL (ref 0.0–0.1)
Immature Granulocytes: 0 %
Lymphocytes Absolute: 3.1 10*3/uL (ref 0.7–3.1)
Lymphs: 42 %
MCH: 27.2 pg (ref 26.6–33.0)
MCHC: 32.8 g/dL (ref 31.5–35.7)
MCV: 83 fL (ref 79–97)
Monocytes Absolute: 0.6 10*3/uL (ref 0.1–0.9)
Monocytes: 8 %
Neutrophils Absolute: 3.4 10*3/uL (ref 1.4–7.0)
Neutrophils: 47 %
RBC: 6.11 x10E6/uL — ABNORMAL HIGH (ref 4.14–5.80)
RDW: 13.2 % (ref 11.6–15.4)
WBC: 7.4 10*3/uL (ref 3.4–10.8)

## 2019-07-14 LAB — COMPREHENSIVE METABOLIC PANEL
ALT: 21 IU/L (ref 0–44)
AST: 20 IU/L (ref 0–40)
Albumin/Globulin Ratio: 1.6 (ref 1.2–2.2)
Albumin: 4.5 g/dL (ref 4.0–5.0)
Alkaline Phosphatase: 78 IU/L (ref 39–117)
BUN/Creatinine Ratio: 7 — ABNORMAL LOW (ref 9–20)
BUN: 10 mg/dL (ref 6–24)
Bilirubin Total: 0.4 mg/dL (ref 0.0–1.2)
CO2: 21 mmol/L (ref 20–29)
Calcium: 10 mg/dL (ref 8.7–10.2)
Chloride: 102 mmol/L (ref 96–106)
Creatinine, Ser: 1.41 mg/dL — ABNORMAL HIGH (ref 0.76–1.27)
GFR calc Af Amer: 68 mL/min/{1.73_m2} (ref >59)
GFR calc non Af Amer: 59 mL/min/{1.73_m2} — ABNORMAL LOW (ref >59)
Globulin, Total: 2.8 g/dL (ref 1.5–4.5)
Glucose: 94 mg/dL (ref 65–99)
Potassium: 4.5 mmol/L (ref 3.5–5.2)
Sodium: 137 mmol/L (ref 134–144)
Total Protein: 7.3 g/dL (ref 6.0–8.5)

## 2019-07-14 LAB — VITAMIN B12: Vitamin B-12: 310 pg/mL (ref 232–1245)

## 2019-07-14 LAB — CK: Total CK: 269 U/L (ref 49–439)

## 2019-07-14 LAB — COPPER, SERUM: Copper: 92 ug/dL (ref 72–166)

## 2019-07-14 LAB — TSH: TSH: 1.49 u[IU]/mL (ref 0.450–4.500)

## 2019-07-16 ENCOUNTER — Telehealth: Payer: Self-pay | Admitting: Neurology

## 2019-07-16 NOTE — Telephone Encounter (Signed)
Please call patient, laboratory evaluation showed elevated creatinine 1.41, with GFR of 68, this is the increase compared to previous level in December 2019, he should increase water intake  Rest of the laboratory evaluations were normal

## 2019-07-17 ENCOUNTER — Telehealth: Payer: Self-pay | Admitting: *Deleted

## 2019-07-17 NOTE — Telephone Encounter (Signed)
PA for Vimpat started through covermymeds ([id number]).  Pt has coverage with [employer] [phone]).  Decision pending.

## 2019-07-17 NOTE — Telephone Encounter (Signed)
PA approved through 07/16/2020.

## 2019-07-17 NOTE — Telephone Encounter (Signed)
Spoke to patient's mother, Fardeen Treasure, who is aware of the lab results.  She will have him increase his water intake.  She is also in the process of getting him established with a PCP.

## 2019-09-03 DIAGNOSIS — Z87898 Personal history of other specified conditions: Secondary | ICD-10-CM | POA: Diagnosis not present

## 2019-09-03 DIAGNOSIS — R899 Unspecified abnormal finding in specimens from other organs, systems and tissues: Secondary | ICD-10-CM | POA: Diagnosis not present

## 2019-09-03 DIAGNOSIS — R3911 Hesitancy of micturition: Secondary | ICD-10-CM | POA: Diagnosis not present

## 2019-09-03 DIAGNOSIS — Z0000 Encounter for general adult medical examination without abnormal findings: Secondary | ICD-10-CM | POA: Diagnosis not present

## 2019-09-24 ENCOUNTER — Telehealth: Payer: Self-pay | Admitting: Neurology

## 2019-09-24 NOTE — Telephone Encounter (Signed)
I called [facility] [employer] [phone]) and completed PA case over the phone.  Pt [id number].  PA approved through 09/23/2020.  Case [id number].

## 2019-09-24 NOTE — Telephone Encounter (Signed)
I called [facility] and they were able to process the prescription. 90-day supply processed at $0 co-pay.  I returned the call to patient's mother and provided her with this update.

## 2019-09-24 NOTE — Telephone Encounter (Signed)
Pts mother called in and stated the pharmacy is giving them a hard time with filling his LamoTRIgine 300 MG TB24 24 hour tablet. She states the insurance isnt going through.

## 2020-01-15 ENCOUNTER — Other Ambulatory Visit: Payer: Self-pay

## 2020-01-15 ENCOUNTER — Telehealth: Payer: Self-pay | Admitting: Neurology

## 2020-01-15 ENCOUNTER — Ambulatory Visit (INDEPENDENT_AMBULATORY_CARE_PROVIDER_SITE_OTHER): Payer: Medicare Other | Admitting: Neurology

## 2020-01-15 ENCOUNTER — Encounter: Payer: Self-pay | Admitting: Neurology

## 2020-01-15 VITALS — BP 147/99 | HR 67 | Temp 97.5°F | Ht 70.0 in | Wt 226.5 lb

## 2020-01-15 DIAGNOSIS — R269 Unspecified abnormalities of gait and mobility: Secondary | ICD-10-CM

## 2020-01-15 DIAGNOSIS — R569 Unspecified convulsions: Secondary | ICD-10-CM

## 2020-01-15 DIAGNOSIS — R531 Weakness: Secondary | ICD-10-CM

## 2020-01-15 MED ORDER — LACOSAMIDE 200 MG PO TABS: 400.0000 mg | ORAL_TABLET | Freq: Two times a day (BID) | ORAL | 4 refills | Status: DC

## 2020-01-15 MED ORDER — LAMOTRIGINE ER 300 MG PO TB24: 600.0000 mg | ORAL_TABLET | Freq: Every day | ORAL | 4 refills | Status: DC

## 2020-01-15 NOTE — Telephone Encounter (Signed)
Please call VNS Megan to coordinate his VNS placement

## 2020-01-15 NOTE — Progress Notes (Addendum)
[facility]  PATIENT: Andrew [name] DOB: May 10, 1971  HISTORY OF PRESENT ILLNESS:Andrew [name], 49 year old male returns for followup. He is with his mother.He has a history of intractable seizure disorder since 49 years old. He has been followed in the office since 1990. 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  He has had EMU monitoring at [facility] in the past that included abnormal baseline EEG due to intermittent predominantly right temporal spike, sharp right,and focal slowing. He continues to refuse surgery to remove irritable focus.  Over the years, he has tried different combination, he has been on current medications  Topamax 100 mg twice a day, +50 mg twice a day, Dilantin 100 mg 2 tablets twice a day, Keppra 500 mg 3 tablets b.i.d, brand name  He is also taking Buspirone 10 mg t.i.d., citalopram 20 mg every day for his depression and angry control prescribed by psychiatry.  He continues to have 2-3 seizures every day, complex partial with secondary generalization, he had burst open few helmet due to his seizure, he continued to smoke marijuana and cigarette. He helps his mother taking care of his elderly bed ridden grandmother at home, has raging spells sometimes.   Vimpat 150 twice a day was added since 2015,   He still has seizure almost daily,  He is now taking Topamax 100 plus 50 mg twice a day , Dilantin 200 mg twice a day, Keppra 500 mg 3 tablets twice a day, Vimpat 150 mg twice a day  UPDATE February 07 2017: He came here urgently in early February 2018 because bilateral hands shaking, he was given the prescription of Vraylar, his shaking has much improved after stop the medication.  He is going through dental procedure, his mother had POA, they agreed to proceed with VNS  UPDATE June 09 2017: His seizure overall has much improved, is no longer having a daily basis, is tolerating current medications, but yesterday on June 08 2017, he  had few recurrent seizures, he is still going through dental procedures,  He is taking Keppra 500 mg 3 tablets, Vimpat 150 mg 2 tablets, topiramate 100 mg 2 tablets twice a day  UPDATE Dec 12 2017: His seizure overall has improved some, he also goes to [facility], taking buspiprone, which has helped his mood some,  Continue have frequent migraine headaches, 3-4 times each week, sometimes multiple episode in the day,  He smokes cigarettes some, mother has convinced him to stop use marijuana.  He continue have significant depression, after discussed with mother and patient, we decided to switch Keppra to lamotrigine,  We also talked about VNS placement, he wants to hold off   UPDATE March 20 2018: He is doing much better now with current combinations, lamotrigine 100 mg 2 tablets twice a day, Vimpat 150 mg 2 tablets twice a day, Topamax 100 mg 2 tablets twice a day  Only few recurrent seizure, much better than his previous baseline his mood has improved as well  UPDATE July 20 2018: He is doing much better now with current combination of Vimpat 150 mg 2 tablets twice a day, lamotrigine 100 mg 3 tablets twice a day, Topamax 100 mg twice a day, he only has seizure once or twice each week, they are short lasting, sometimes without recurrent seizure in 1 week, this is in contrast to previous daily multiple seizure episode,  But he complains of dizziness, lightheadedness after each dose of medications,  His depression has much improved too, is more  compliant with his mother  EEG in April 2019 showed generalized epileptiform discharge in the background of mild dysrhythmic slowing.  Update October 04, 2018 He is accompanied by his mother at today's visit, he presented to emergency room on September 24, 2018 after found falling down at home, he has no recollection of the event, mother thought he might have a seizure, his seizure overall is under excellent control, tolerating  lamotrigine XR 300 mg 2 tablets at nighttime, Vimpat 2 tablets twice a day, his mood is also under good control.  I personally reviewed MRI of brain: Advanced atrophy of the cerebellum, likely secondary to chronic antiepileptic medication MRI of cervical spine: Multilevel degenerative changes, moderate to severe foraminal narrowing at C3-4, C7-T1, secondary to multilevel uncovertebral disease, there is no evidence of canal stenosis.  Patient complains slow worsening gait abnormality over the past few months, urinary frequency, urgency, chronic low back [name], also mild neck [name] radiating [name] to right hand, arm  Virtual Visit via Phone March 14 2019: I interviewed patient and his mother, his seizure is under very good control, only had 3 seizure and few staring spells over past 4 months.  He continues to have gait abnormalities, using walker, right arm weakness, no incontinence.  EMG/NCS on Nov 10 2018: showed evidence of active right cervical radiculopathy, involving right C7,8, T1, moderate C6 myotomes.   He was seen by neurosurgeon, not a surgical candidate.   UPDATE July 12 2019: He is doing very well from a seizure standpoint with current combination lamotrigine ER 300 mg 2 tablets at bedtime, Vimpat 200 mg 2 tablets twice a day, he had minor staring spells about once a week, rarely has generalized seizures  Continue have significant right hand weakness, but not progressing, continue to have gait abnormality,  Mother reported few episode of sudden onset generalized weakness, could not walk, left arm against gravity, most recent episode was July 10, 2019, he has got up taking his morning medications, he noticed sudden onset weakness, he was able to call mother to check on it, episode lasted about few hours, gradually improved  Reviewed emergency room presentation on November 20 2018, similar episode on September 24, 2018, during the spells, he was able to move his toes and fingers, but  very slowly, laboratory evaluations December showed negative troponin, normal CMP, with glucose of 99, normal CBC,  UPDATE Jan 15 2020: He is overall doing very well, there was no recurrent generalized tonic-clonic seizure, but he has staring spells, transient unresponsiveness almost daily basis, he no longer has to wear his helmet, continue have right hand weakness, gait difficulty has improved  REVIEW OF SYSTEMS: Full 14 system review of systems performed and notable only for those listed, all others are neg:  As above   ALLERGIES: No Known Allergies  HOME MEDICATIONS: Outpatient Medications Prior to Visit  Medication Sig Dispense Refill  . busPIRone (BUSPAR) 15 MG tablet Take 15 mg by mouth 3 (three) times daily.     Marland Kitchen lacosamide (VIMPAT) 200 MG TABS tablet Take 2 tablets (400 mg total) by mouth 2 (two) times daily. 360 tablet 4  . LamoTRIgine 300 MG TB24 24 hour tablet Take 2 tablets (600 mg total) by mouth at bedtime. 180 tablet 4   No facility-administered medications prior to visit.    PAST MEDICAL HISTORY: Past Medical History:  Diagnosis Date  . Anxiety   . Depression   . Seizures ([place])     PAST SURGICAL HISTORY: Past Surgical History:  Procedure Laterality Date  . none      FAMILY HISTORY: Family History  Problem Relation Age of Onset  . Asthma Father     SOCIAL HISTORY: Social History   Socioeconomic History  . Marital status: Single    Spouse name: Not on file  . Number of children: 0  . Years of education: 33  . Highest education level: Not on file  Occupational History    Comment: Disabled  Tobacco Use  . Smoking status: Current Every Day Smoker    Packs/day: 0.50    Years: 20.00    Pack years: 10.00    Types: Cigarettes  . Smokeless tobacco: Never Used  Substance and Sexual Activity  . Alcohol use: No    Alcohol/week: 0.0 standard drinks  . Drug use: No    Comment: 11/08/16 - reports he has stopped smoking marijuana.  . Sexual activity:  Not on file  Other Topics Concern  . Not on file  Social History Narrative   Patient lives at home with his mother Salem Laskoski ). Patient is disabled. Patient has high school education.   Left handed.   Caffeine- soda - pepsi four cans daily.   Patients father died of over dose.-40   Social Determinants of Health   Financial Resource Strain:   . Difficulty of Paying Living Expenses: Not on file  Food Insecurity:   . Worried About Charity fundraiser in the Last Year: Not on file  . Ran Out of Food in the Last Year: Not on file  Transportation Needs:   . Lack of Transportation (Medical): Not on file  . Lack of Transportation (Non-Medical): Not on file  Physical Activity:   . Days of Exercise per Week: Not on file  . Minutes of Exercise per Session: Not on file  Stress:   . Feeling of Stress : Not on file  Social Connections:   . Frequency of Communication with Friends and Family: Not on file  . Frequency of Social Gatherings with Friends and Family: Not on file  . Attends Religious Services: Not on file  . Active Member of Clubs or Organizations: Not on file  . Attends Archivist Meetings: Not on file  . Marital Status: Not on file  Intimate Partner Violence:   . Fear of Current or Ex-Partner: Not on file  . Emotionally Abused: Not on file  . Physically Abused: Not on file  . Sexually Abused: Not on file     PHYSICAL EXAM  Vitals:   01/15/20 1433  BP: (!) 147/99  Pulse: 67  Temp: (!) 97.5 F (36.4 C)  Weight: 226 lb 8 oz (102.7 kg)  Height: 5\' 10"  (1.778 m)   Body mass index is 32.5 kg/m.  PHYSICAL EXAMNIATION:  Gen: NAD, conversant, well nourised, well groomed                     Cardiovascular: Regular rate rhythm, no peripheral edema, warm, nontender. Eyes: Conjunctivae clear without exudates or hemorrhage Neck: Supple, no carotid bruits. Pulmonary: Clear to auscultation bilaterally   NEUROLOGICAL EXAM:  MENTAL STATUS: Speech/cognition:     Speech is normal; fluent and spontaneous with normal comprehension.   CRANIAL NERVES: CN II: Visual fields are full to confrontation.  Pupils are round equal and briskly reactive to light. CN III, IV, VI: extraocular movement are normal. No ptosis. CN V: Facial sensation is intact to pinprick in all 3 divisions bilaterally. Corneal responses are intact.  CN VII: Face is symmetric with normal eye closure and smile. CN VIII: Hearing is normal to casual conversation CN IX, X: Palate elevates symmetrically. Phonation is normal. CN XI: Head turning and shoulder shrug are intact CN XII: Tongue is midline with normal movements and no atrophy.  MOTOR: Right hand intrinsic muscle atrophy, there was no significant right upper extremity proximal muscle weakness, right finger abduction 3, grip 4, finger extension 4+, finger flexion 4, wrist flexion 4  REFLEXES: Reflexes are symmetric  SENSORY: He reported decreased light touch pinprick at right hand, unreliable, also split tuning fork at midline  COORDINATION: Rapid alternating movements and fine finger movements are intact. There is no dysmetria on finger-to-nose and heel-knee-shin.    GAIT/STANCE: Can get up from seated position without pushing on chair arm, wide-based, cautious,  DIAGNOSTIC DATA (LABS, IMAGING, TESTING) -   ASSESSMENT AND PLAN Intractable epilepsy  Doing very well with current extended release lamotrigine 300mg  2 tabs every night.   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 Ph.D.  [facility] [place], [place] [address] Phone: [phone] Fax:      [phone]  He was seen by neurosurgeon Dr. Kathyrn Sheriff on 01/24/2020, agreed on proceed with vagal nerve stimulation placement

## 2020-01-15 NOTE — Telephone Encounter (Signed)
I called Andrew [name] (VNS rep for our office). She will email a patient referral form for Dr. Krista Blue to sign. She will need it faxed back with the last two office notes, demographics and insurance card attached.  Once she receives it, they will verify his insurance benefits and reach out to the patient's mother, Andrew [name].  The patient will then be scheduled for an appt with Dr. Kathyrn Sheriff at [facility].

## 2020-01-16 NOTE — Telephone Encounter (Signed)
Paperwork has been completed and signed by Dr. Krista Blue. The necessary documents have been attached. Our VNS rep has had a name change - now Lamont Dowdy [phone]). She stopped by the office to pick up the form. She will provide updates once the patient's insurance has been contacted.

## 2020-01-24 DIAGNOSIS — G40219 Localization-related (focal) (partial) symptomatic epilepsy and epileptic syndromes with complex partial seizures, intractable, without status epilepticus: Secondary | ICD-10-CM | POA: Diagnosis not present

## 2020-01-24 DIAGNOSIS — Z6832 Body mass index (BMI) 32.0-32.9, adult: Secondary | ICD-10-CM | POA: Insufficient documentation

## 2020-01-24 DIAGNOSIS — I10 Essential (primary) hypertension: Secondary | ICD-10-CM | POA: Insufficient documentation

## 2020-01-30 ENCOUNTER — Telehealth: Payer: Self-pay | Admitting: *Deleted

## 2020-01-30 NOTE — Telephone Encounter (Signed)
Megan (VNS rep) called to let us know the patient was evaluated by Dr. Kathyrn Sheriff at [facility]. Says he was approved for VNS placement. However, now the patient is reporting that he has changed his mind.   I called his mother for further information. Says the patient is still nervous about getting the VNS. He has an appt for NCV/EMG on 02/18/20 and would like to speak to Dr. Krista Blue further at that time.

## 2020-02-18 ENCOUNTER — Other Ambulatory Visit: Payer: Self-pay

## 2020-02-18 ENCOUNTER — Ambulatory Visit (INDEPENDENT_AMBULATORY_CARE_PROVIDER_SITE_OTHER): Payer: Medicare Other | Admitting: Neurology

## 2020-02-18 ENCOUNTER — Telehealth: Payer: Self-pay | Admitting: Neurology

## 2020-02-18 DIAGNOSIS — G629 Polyneuropathy, unspecified: Secondary | ICD-10-CM

## 2020-02-18 DIAGNOSIS — R269 Unspecified abnormalities of gait and mobility: Secondary | ICD-10-CM

## 2020-02-18 DIAGNOSIS — R569 Unspecified convulsions: Secondary | ICD-10-CM | POA: Diagnosis not present

## 2020-02-18 DIAGNOSIS — R531 Weakness: Secondary | ICD-10-CM | POA: Diagnosis not present

## 2020-02-18 NOTE — Telephone Encounter (Addendum)
I talked with his mother,    besides lumbar puncture that we mentioned during EMG nerve conduction study on February 18, 2020, I also ordered MRI of cervical spine without contrast, previous MRI in November 2019 showed multilevel degenerative changes, with evidence of moderately to severe bilateral foraminal stenosis at multiple levels, I would like to repeat MRI cervical spine to see if there are any changes  In addition, I also ordered laboratory evaluation GM 1 antibody and some other laboratory evaluations

## 2020-02-18 NOTE — Progress Notes (Signed)
[facility]  PATIENT: Andrew [name] DOB: [birth year]  HISTORY OF PRESENT ILLNESS:[name], 49 year old male returns for followup. He is with his mother.He has a history of intractable seizure disorder since 49 years old. He has been followed in the office since 1990. 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  He has had EMU monitoring at [facility] in the past that included abnormal baseline EEG due to intermittent predominantly right temporal spike, sharp right,and focal slowing. He continues to refuse surgery to remove irritable focus.  Over the years, he has tried different combination, he has been on current medications  Topamax 100 mg twice a day, +50 mg twice a day, Dilantin 100 mg 2 tablets twice a day, Keppra 500 mg 3 tablets b.i.d, brand name  He is also taking Buspirone 10 mg t.i.d., citalopram 20 mg every day for his depression and angry control prescribed by psychiatry.  He continues to have 2-3 seizures every day, complex partial with secondary generalization, he had burst open few helmet due to his seizure, he continued to smoke marijuana and cigarette. He helps his mother taking care of his elderly bed ridden grandmother at home, has raging spells sometimes.   Vimpat 150 twice a day was added since 2015,   He still has seizure almost daily,  He is now taking Topamax 100 plus 50 mg twice a day , Dilantin 200 mg twice a day, Keppra 500 mg 3 tablets twice a day, Vimpat 150 mg twice a day  UPDATE February 07 2017: He came here urgently in early February 2018 because bilateral hands shaking, he was given the prescription of Vraylar, his shaking has much improved after stop the medication.  He is going through dental procedure, his mother had POA, they agreed to proceed with VNS  UPDATE June 09 2017: His seizure overall has much improved, is no longer having a daily basis, is tolerating current medications, but yesterday on June 08 2017, he  had few recurrent seizures, he is still going through dental procedures,  He is taking Keppra 500 mg 3 tablets, Vimpat 150 mg 2 tablets, topiramate 100 mg 2 tablets twice a day  UPDATE Dec 12 2017: His seizure overall has improved some, he also goes to [facility], taking buspiprone, which has helped his mood some,  Continue have frequent migraine headaches, 3-4 times each week, sometimes multiple episode in the day,  He smokes cigarettes some, mother has convinced him to stop use marijuana.  He continue have significant depression, after discussed with mother and patient, we decided to switch Keppra to lamotrigine,  We also talked about VNS placement, he wants to hold off   UPDATE March 20 2018: He is doing much better now with current combinations, lamotrigine 100 mg 2 tablets twice a day, Vimpat 150 mg 2 tablets twice a day, Topamax 100 mg 2 tablets twice a day  Only few recurrent seizure, much better than his previous baseline his mood has improved as well  UPDATE July 20 2018: He is doing much better now with current combination of Vimpat 150 mg 2 tablets twice a day, lamotrigine 100 mg 3 tablets twice a day, Topamax 100 mg twice a day, he only has seizure once or twice each week, they are short lasting, sometimes without recurrent seizure in 1 week, this is in contrast to previous daily multiple seizure episode,  But he complains of dizziness, lightheadedness after each dose of medications,  His depression has much improved too, is more  compliant with his mother  EEG in April 2019 showed generalized epileptiform discharge in the background of mild dysrhythmic slowing.  Update October 04, 2018 He is accompanied by his mother at today's visit, he presented to emergency room on September 24, 2018 after found falling down at home, he has no recollection of the event, mother thought he might have a seizure, his seizure overall is under excellent control, tolerating  lamotrigine XR 300 mg 2 tablets at nighttime, Vimpat 2 tablets twice a day, his mood is also under good control.  I personally reviewed MRI of brain: Advanced atrophy of the cerebellum, likely secondary to chronic antiepileptic medication MRI of cervical spine: Multilevel degenerative changes, moderate to severe foraminal narrowing at C3-4, C7-T1, secondary to multilevel uncovertebral disease, there is no evidence of canal stenosis.  Patient complains slow worsening gait abnormality over the past few months, urinary frequency, urgency, chronic low back [name], also mild neck [name] radiating [name] to right hand, arm  Virtual Visit via Phone March 14 2019: I interviewed patient and his mother, his seizure is under very good control, only had 3 seizure and few staring spells over past 4 months.  He continues to have gait abnormalities, using walker, right arm weakness, no incontinence.  EMG/NCS on Nov 10 2018: showed evidence of active right cervical radiculopathy, involving right C7,8, T1, moderate C6 myotomes.   He was seen by neurosurgeon, not a surgical candidate.   UPDATE July 12 2019: He is doing very well from a seizure standpoint with current combination lamotrigine ER 300 mg 2 tablets at bedtime, Vimpat 200 mg 2 tablets twice a day, he had minor staring spells about once a week, rarely has generalized seizures  Continue have significant right hand weakness, but not progressing, continue to have gait abnormality,  Mother reported few episode of sudden onset generalized weakness, could not walk, left arm against gravity, most recent episode was July 10, 2019, he has got up taking his morning medications, he noticed sudden onset weakness, he was able to call mother to check on it, episode lasted about few hours, gradually improved  Reviewed emergency room presentation on November 20 2018, similar episode on September 24, 2018, during the spells, he was able to move his toes and fingers, but  very slowly, laboratory evaluations December showed negative troponin, normal CMP, with glucose of 99, normal CBC,  UPDATE Jan 15 2020: He is overall doing very well, there was no recurrent generalized tonic-clonic seizure, but he has staring spells, transient unresponsiveness almost daily basis, he no longer has to wear his helmet, continue have right hand weakness, gait difficulty has improved  UPDATE February 18 2020: He was seen by neurosurgeon Dr. Kathyrn Sheriff, deemed to be a good candidate for vagal nerve stimulation, however, patient have a tendency to back-and-forth with this decision, all VNS related question was answered, despite polypharmacy large dose of Vimpat 400 mg twice a day, lamotrigine 600 mg daily, he continues to have frequent spells of "small seizures", staring into space, unresponsive for 1 minutes,  this is a significant compared to previous frequent drop attacks, he has to wear helmet in the past,  He continue have significant right more than left hand weakness, gait abnormality, he denies significant sensory changes  He came in today for repeat electrodiagnostic study today, which showed 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  I will refer him to fluoroscopy guided lumbar puncture  I again personally reviewed MRIs in 2019, MRI of cervical spine/brain: No acute abnormality, advanced atrophy of cerebellum likely secondary to chronic antielliptic medication therapy, multilevel cervical degenerative changes, evidence of significant canal stenosis, moderate to severe at bilateral C4-5, C5-6, C6-7, C7-T1 MRI of lumbar spine showed mild degenerative changes, no significant  canal or foraminal narrowing.   REVIEW OF SYSTEMS: Full 14 system review of systems performed and notable only for those listed, all others are neg:  As above   ALLERGIES: No Known Allergies  HOME MEDICATIONS: Outpatient Medications Prior to Visit  Medication Sig Dispense Refill  . busPIRone (BUSPAR) 15 MG tablet Take 15 mg by mouth 3 (three) times daily.     Marland Kitchen lacosamide (VIMPAT) 200 MG TABS tablet Take 2 tablets (400 mg total) by mouth 2 (two) times daily. 360 tablet 4  . LamoTRIgine 300 MG TB24 24 hour tablet Take 2 tablets (600 mg total) by mouth at bedtime. 180 tablet 4   No facility-administered medications prior to visit.    PAST MEDICAL HISTORY: Past Medical History:  Diagnosis Date  . Anxiety   . Depression   . Seizures ([place])     PAST SURGICAL HISTORY: Past Surgical History:  Procedure Laterality Date  . none      FAMILY HISTORY: Family History  Problem Relation Age of Onset  . Asthma Father     SOCIAL HISTORY: Social History   Socioeconomic History  . Marital status: Single    Spouse name: Not on file  . Number of children: 0  . Years of education: 51  . Highest education level: Not on file  Occupational History    Comment: Disabled  Tobacco Use  . Smoking status: Current Every Day Smoker    Packs/day: 0.50    Years: 20.00    Pack years: 10.00    Types: Cigarettes  . Smokeless tobacco: Never Used  Substance and Sexual Activity  . Alcohol use: No    Alcohol/week: 0.0 standard drinks  . Drug use: No    Comment: 11/08/16 - reports he has stopped smoking marijuana.  . Sexual activity: Not on file  Other Topics Concern  . Not on file  Social History Narrative   Patient lives at home with his mother Andrew [name] ). Patient is disabled. Patient has high school education.   Left handed.   Caffeine- soda - pepsi four cans daily.   Patients father died of over dose.-40   Social Determinants of Health   Financial Resource Strain:   .  Difficulty of Paying Living Expenses:   Food Insecurity:   . Worried About Charity fundraiser in the Last Year:   . Arboriculturist in the Last Year:   Transportation Needs:   . Film/video editor (Medical):   Marland Kitchen Lack of Transportation (Non-Medical):   Physical Activity:   . Days of Exercise per Week:   . Minutes of Exercise per Session:   Stress:   . Feeling of Stress :   Social Connections:   . Frequency of Communication with Friends and Family:   . Frequency of Social Gatherings with Friends and Family:   . Attends Religious Services:   . Active Member of Clubs or Organizations:   . Attends Archivist Meetings:   Marland Kitchen Marital Status:   Intimate  Partner Violence:   . Fear of Current or Ex-Partner:   . Emotionally Abused:   Marland Kitchen Physically Abused:   . Sexually Abused:      PHYSICAL EXAM  There were no vitals filed for this visit. There is no height or weight on file to calculate BMI.  PHYSICAL EXAMNIATION:  Gen: NAD, conversant, well nourised, well groomed                      NEUROLOGICAL EXAM:  MENTAL STATUS: Speech/cognition:    Speech is normal; fluent and spontaneous with normal comprehension.  Follow commands on neurological examinations  CRANIAL NERVES: CN II: Visual fields are full to confrontation.  Pupils are round equal and briskly reactive to light. CN III, IV, VI: extraocular movement are normal. No ptosis. CN V: Facial sensation is intact to pinprick in all 3 divisions bilaterally. Corneal responses are intact.  CN VII: Face is symmetric with normal eye closure and smile. CN VIII: Hearing is normal to casual conversation CN IX, X: Palate elevates symmetrically. Phonation is normal. CN XI: Head turning and shoulder shrug are intact CN XII: Tongue is midline with normal movements and no atrophy.  MOTOR: Right hand intrinsic muscle atrophy, there was no significant right upper extremity proximal muscle weakness, right finger abduction 3, grip  4, finger extension 4+, finger flexion 4, wrist flexion 4, mild left hand grip, finger abduction weakness.  He also has mild bilateral toe flexion extension weakness,  REFLEXES: Reflexes are absent bilaterally  SENSORY: He reported decreased light touch pinprick at right hand  COORDINATION: Rapid alternating movements and fine finger movements are intact. There is no dysmetria on finger-to-nose and heel-knee-shin.    GAIT/STANCE: He needs to push on chair arm to get up from seated position, wide-based, cautious, not stand up on heels, tiptoe, Romberg signs was positive  DIAGNOSTIC DATA (LABS, IMAGING, TESTING) -   ASSESSMENT AND PLAN Intractable epilepsy  Doing very well with current extended release lamotrigine 300mg  2 tabs every night.   Vimpat  200 mg 2 tablets twice a day,  Frequent generalized tonic-clonic seizure, but continue have staring spells, he is a good candidate for VNS,  Has been seen by neurosurgeon Dr. Kathyrn Sheriff twice, however patient is back-and-forth about his decision, being asked related questions answered today  Slow worsening gait abnormality,  No significant sensory complaints, areflexia bilateral lower extremity, right intrinsic hand muscle atrophy since 2019, is stable now   Evidence of advanced cervical degenerative disease on MRI, moderate to severe multilevel foraminal narrowing  EMG nerve conduction study today showed well-preserved bilateral upper and lower extremity sensory responses, with evidence of prolonged F-wave latency at multiple nerves tested, there was also evidence of temporal dispersion at bilateral tibial, left median motor responses. Electromyography showed no evidence of active denervation, there was evidence of mild chronic neuropathic changes. above findings suggest possibility of multifocal motor neuropathy   Will proceed with repeat MRI of cervical spine  Fluoroscopy guided lumbar puncture  If the spinal fluid showed evidence of  crystalbumin dissociation  May consider IVIG treatment     Marcial Pacas, M.D. Ph.D.  [facility] [place], [place] [address] Phone: [phone] Fax:      [phone]  He was seen by neurosurgeon Dr. Kathyrn Sheriff on 01/24/2020, agreed on proceed with vagal nerve stimulation placement

## 2020-02-18 NOTE — Procedures (Signed)
Full Name: Andrew [name] Gender: Male MRN #: [id number] Date of Birth: [birth year]    Visit Date: 02/18/2020 08:43 Age: 49 Years Examining Physician: Marcial Pacas, MD  Referring Physician: Marcial Pacas, MD History: 49 year old male with right hand weakness progressive gait abnormality since 2019, he denies significant sensory changes.  Summary of the tests: Nerve conduction study: Bilateral sural, superficial peroneal, median and ulnar sensory responses were normal. Right tibial, bilateral peroneal to EDB, left ulnar motor responses were normal.  Left median motor responses showed no evidence of dispersion at the stimulation site. Right median motor responses were absent.  Right ulnar motor responses showed significantly decreased CMAP amplitude, with mildly slow conduction velocity.  Left tibial motor responses showed mildly decreased CMAP amplitude, with borderline conduction velocity.  Bilateral tibial, ulnar F-wave latency were mild to moderately prolonged.  Electromyography: Selected needle examination of bilateral lower extremity muscles, right upper extremity muscles, bilateral lumbosacral paraspinal and right cervical paraspinal muscles were performed.  There was evidence of chronic neuropathic changes involving bilateral L4, 5, S1 myotomes.  There is also active neuropathic changes involving right T1, C8, milder degree C7 myotomes.  There is no evidence of activity induration involving bilateral lumbosacral, or cervical paraspinal muscles.  Conclusion: This is an abnormal study.  There is electrodiagnostic evidence of chronic neuropathy, mainly involving motor component, sparing sensory component.  There is evidence of prolonged F-wave latency, temporal dispersion at left median motor, lateral tibial motor responses, above findings suggestive of multifocal neuropathy.    ------------------------------- Marcial Pacas, M.D. PhD  [facility] [place], [place] [address] Tel: [phone] Fax: [phone]         [facility]    Nerve / Sites Muscle Latency Ref. Amplitude Ref. Rel Amp Segments Distance Velocity Ref. Area    ms ms mV mV %  cm m/s m/s mVms  L Median - APB     Wrist APB 3.1 ?4.4 7.6 ?4.0 100 Wrist - APB 7   23.2     Upper arm APB 8.3  4.0  52.6 Upper arm - Wrist 26 51 ?49 17.7  R Median - APB     Wrist APB NR ?4.4 NR ?4.0 NR Wrist - APB 7   NR     Upper arm APB NR  NR  NR Upper arm - Wrist 25 NR ?49 NR  L Ulnar - ADM     Wrist ADM 2.7 ?3.3 10.4 ?6.0 100 Wrist - ADM 7   24.2     B.Elbow ADM 7.0  9.1  87.6 B.Elbow - Wrist 23 54 ?49 24.2     A.Elbow ADM 8.8  8.5  93.3 A.Elbow - B.Elbow 10 54 ?49 24.2         A.Elbow - Wrist      R Ulnar - ADM     Wrist ADM 3.3 ?3.3 1.0 ?6.0 100 Wrist - ADM 7   2.6     B.Elbow ADM 8.3  1.3  128 B.Elbow - Wrist 23 46 ?49 4.9     A.Elbow ADM 10.4  1.2  90 A.Elbow - B.Elbow 10 46 ?49 4.7         A.Elbow - Wrist      L Peroneal - EDB     Ankle EDB 4.7 ?6.5 3.0 ?2.0 100 Ankle - EDB 9   8.8     Fib head EDB 12.0  2.7  91.7 Fib head - Ankle 32 44 ?  44 9.3     Pop fossa EDB 14.3  2.7  96.4 Pop fossa - Fib head 10 44 ?44 9.1         Pop fossa - Ankle      R Peroneal - EDB     Ankle EDB 4.2 ?6.5 6.5 ?2.0 100 Ankle - EDB 9   14.9     Fib head EDB 11.1  4.7  72.7 Fib head - Ankle 31 45 ?44 13.7     Pop fossa EDB 13.4  4.2  89.4 Pop fossa - Fib head 10 44 ?44 13.5         Pop fossa - Ankle      L Tibial - AH     Ankle AH 3.3 ?5.8 3.5 ?4.0 100 Ankle - AH 9   8.6     Pop fossa AH 12.6  2.0  56.9 Pop fossa - Ankle 38 41 ?41 7.0  R Tibial - AH     Ankle AH 3.3 ?5.8 4.4 ?4.0 100 Ankle - AH 9   13.3     Pop fossa AH 14.0  2.9  65.8 Pop fossa - Ankle 38 35 ?41 9.5                     SNC    Nerve / Sites Rec. Site Peak Lat Ref.  Amp Ref. Segments Distance Peak Diff Ref.    ms ms V V  cm ms ms  L Sural - Ankle (Calf)     Calf Ankle 3.9 ?4.4 14 ?6 Calf - Ankle 14    R Sural - Ankle (Calf)      Calf Ankle 3.7 ?4.4 17 ?6 Calf - Ankle 14    L Superficial peroneal - Ankle     Lat leg Ankle 4.3 ?4.4 8 ?6 Lat leg - Ankle 14    R Superficial peroneal - Ankle     Lat leg Ankle 4.3 ?4.4 7 ?6 Lat leg - Ankle 14    L Median, Ulnar - Transcarpal comparison     Median Palm Wrist 1.7 ?2.2 88 ?35 Median Palm - Wrist 8       Ulnar Palm Wrist 1.8 ?2.2 17 ?12 Ulnar Palm - Wrist 8          Median Palm - Ulnar Palm  -0.1 ?0.4  R Median, Ulnar - Transcarpal comparison     Median Palm Wrist 1.7 ?2.2 117 ?35 Median Palm - Wrist 8       Ulnar Palm Wrist 1.8 ?2.2 20 ?12 Ulnar Palm - Wrist 8          Median Palm - Ulnar Palm  -0.0 ?0.4  L Median - Orthodromic (Dig II, Mid palm)     Dig II Wrist 2.9 ?3.4 12 ?10 Dig II - Wrist 13    R Median - Orthodromic (Dig II, Mid palm)     Dig II Wrist 2.9 ?3.4 21 ?10 Dig II - Wrist 14    L Ulnar - Orthodromic, (Dig V, Mid palm)     Dig V Wrist 2.7 ?3.1 11 ?5 Dig V - Wrist 11    R Ulnar - Orthodromic, (Dig V, Mid palm)     Dig V Wrist 2.7 ?3.1 10 ?5 Dig V - Wrist 19                           F  Wave  Nerve F Lat Ref.   ms ms  L Tibial - AH 59.5 ?56.0  L Ulnar - ADM 34.4 ?32.0  R Tibial - AH 60.1 ?56.0  R Ulnar - ADM 35.4 ?32.0             EMG Summary Table    Spontaneous MUAP Recruitment  Muscle IA Fib PSW Fasc Other Amp Dur. Poly Pattern  R. Tibialis anterior Normal None None None _______ Normal Normal Normal Reduced  R. Tibialis posterior Normal None None None _______ Normal Normal Normal Reduced  R. Peroneus longus Normal None None None _______ Normal Normal Normal Reduced  R. Gastrocnemius (Medial head) Normal None None None _______ Normal Normal Normal Reduced  R. Vastus lateralis Normal None None None _______ Normal Normal Normal Reduced  L. Tibialis anterior Normal None None None _______ Normal Normal Normal Reduced  L. Tibialis posterior Normal None None None _______ Normal Normal 1+ Reduced  L. Peroneus longus Normal None None None  _______ Normal Normal Normal Reduced  L. Gastrocnemius (Medial head) Normal None None None _______ Normal Normal Normal Reduced  L. Vastus lateralis Normal None None None _______ Normal Normal Normal Reduced  R. Lumbar paraspinals (mid) Normal None None None _______ Normal Normal Normal Normal  R. Lumbar paraspinals (low) Normal None None None _______ Normal Normal Normal Normal  L. Lumbar paraspinals (mid) Normal None None None _______ Normal Normal Normal Normal  L. Lumbar paraspinals (low) Normal None None None _______ Normal Normal Normal Normal  R. Abductor digiti minimi (manus) Increased 1+ 1+ None _______ Normal Increased Normal Discrete  R. Pronator teres Increased None None None _______ Normal Normal Normal Reduced  R. Flexor digitorum profundus (Ulnar) Increased None None None _______ Increased Increased 1+ Reduced  R. Biceps brachii Normal None None None _______ Increased Increased 1+ Reduced  R. Deltoid Normal None None None _______ Increased Increased 1+ Reduced  R. Cervical paraspinals Normal None None None _______ Normal Normal Normal Normal

## 2020-02-18 NOTE — Telephone Encounter (Signed)
Medicare/medicaid order sent to [facility]. No auth they will reach out to the patient to schedule.  °

## 2020-02-19 ENCOUNTER — Other Ambulatory Visit (INDEPENDENT_AMBULATORY_CARE_PROVIDER_SITE_OTHER): Payer: Self-pay

## 2020-02-19 DIAGNOSIS — R269 Unspecified abnormalities of gait and mobility: Secondary | ICD-10-CM | POA: Diagnosis not present

## 2020-02-19 DIAGNOSIS — Z0289 Encounter for other administrative examinations: Secondary | ICD-10-CM

## 2020-02-19 DIAGNOSIS — G629 Polyneuropathy, unspecified: Secondary | ICD-10-CM | POA: Diagnosis not present

## 2020-02-19 DIAGNOSIS — R569 Unspecified convulsions: Secondary | ICD-10-CM | POA: Diagnosis not present

## 2020-02-19 NOTE — Addendum Note (Signed)
Addended by: Inis Sizer D on: 02/19/2020 09:11 AM   Modules accepted: Orders

## 2020-02-26 LAB — MULTIPLE MYELOMA PANEL, SERUM
Albumin SerPl Elph-Mcnc: 3.7 g/dL (ref 2.9–4.4)
Albumin/Glob SerPl: 1.1 (ref 0.7–1.7)
Alpha 1: 0.2 g/dL (ref 0.0–0.4)
Alpha2 Glob SerPl Elph-Mcnc: 0.8 g/dL (ref 0.4–1.0)
B-Globulin SerPl Elph-Mcnc: 1.4 g/dL — ABNORMAL HIGH (ref 0.7–1.3)
Gamma Glob SerPl Elph-Mcnc: 1 g/dL (ref 0.4–1.8)
Globulin, Total: 3.5 g/dL (ref 2.2–3.9)
IgA/Immunoglobulin A, Serum: 438 mg/dL — ABNORMAL HIGH (ref 90–386)
IgG (Immunoglobin G), Serum: 1212 mg/dL (ref 603–1613)
IgM (Immunoglobulin M), Srm: 98 mg/dL (ref 20–172)
Total Protein: 7.2 g/dL (ref 6.0–8.5)

## 2020-02-26 LAB — COPPER, SERUM: Copper: 111 ug/dL (ref 69–132)

## 2020-02-26 LAB — INTERPRETATION

## 2020-02-26 LAB — GM1 IGG AUTOANTIBODIES: GM1 IgG Autoantibodies: <20 % (ref 0–30)

## 2020-02-26 LAB — ANA W/REFLEX IF POSITIVE: Anti Nuclear Antibody (ANA): NEGATIVE

## 2020-02-27 ENCOUNTER — Telehealth: Payer: Self-pay | Admitting: *Deleted

## 2020-02-27 NOTE — Telephone Encounter (Signed)
I spoke to his mother on [place]. She is aware of his lab results.

## 2020-02-27 NOTE — Telephone Encounter (Signed)
-----   Message from Marcial Pacas, MD sent at 02/27/2020  3:01 PM EDT ----- Please call patient: Laboratory evaluations showed no significant abnormalities

## 2020-03-03 ENCOUNTER — Ambulatory Visit
Admission: RE | Admit: 2020-03-03 | Discharge: 2020-03-03 | Disposition: A | Discharge status: Home / Self Care | Payer: Medicare Other | Source: Ambulatory Visit | Attending: Neurology | Admitting: Neurology

## 2020-03-03 ENCOUNTER — Other Ambulatory Visit: Payer: Self-pay

## 2020-03-03 VITALS — BP 157/107 | HR 66

## 2020-03-03 DIAGNOSIS — R569 Unspecified convulsions: Secondary | ICD-10-CM

## 2020-03-03 DIAGNOSIS — M6250 Muscle wasting and atrophy, not elsewhere classified, unspecified site: Secondary | ICD-10-CM | POA: Diagnosis not present

## 2020-03-03 DIAGNOSIS — R269 Unspecified abnormalities of gait and mobility: Secondary | ICD-10-CM

## 2020-03-03 DIAGNOSIS — G629 Polyneuropathy, unspecified: Secondary | ICD-10-CM | POA: Diagnosis not present

## 2020-03-03 DIAGNOSIS — R531 Weakness: Secondary | ICD-10-CM | POA: Diagnosis not present

## 2020-03-03 NOTE — Discharge Instructions (Signed)

## 2020-03-05 ENCOUNTER — Telehealth: Payer: Self-pay | Admitting: Neurology

## 2020-03-05 NOTE — Telephone Encounter (Signed)
Please call patient for normal csf

## 2020-03-05 NOTE — Telephone Encounter (Signed)
I have spoken to his mother on [place]. She is aware of his CSF results.

## 2020-03-14 ENCOUNTER — Other Ambulatory Visit: Payer: Self-pay

## 2020-03-14 ENCOUNTER — Ambulatory Visit
Admission: RE | Admit: 2020-03-14 | Discharge: 2020-03-14 | Disposition: A | Discharge status: Home / Self Care | Payer: Medicare Other | Source: Ambulatory Visit | Attending: Neurology | Admitting: Neurology

## 2020-03-14 DIAGNOSIS — R569 Unspecified convulsions: Secondary | ICD-10-CM | POA: Diagnosis not present

## 2020-03-14 DIAGNOSIS — G629 Polyneuropathy, unspecified: Secondary | ICD-10-CM | POA: Diagnosis not present

## 2020-03-14 DIAGNOSIS — R531 Weakness: Secondary | ICD-10-CM

## 2020-03-14 DIAGNOSIS — R269 Unspecified abnormalities of gait and mobility: Secondary | ICD-10-CM

## 2020-03-17 ENCOUNTER — Telehealth: Payer: Self-pay | Admitting: Neurology

## 2020-03-17 NOTE — Telephone Encounter (Signed)
I spoke to Andrew [name] and provided the information below. He is in agreement to start IVIG treatments. Per Dr. Krista Blue, he will need a total of six treatments.  I have left his mother a message on her mobile number. He will let her know I would also like to speak with her about the findings below and the ordered medication therapy. He will have her call me back.  Signed orders for IVIG provided to [facility]. Once approved, they will contact the patient to schedule his appointments.

## 2020-03-17 NOTE — Telephone Encounter (Signed)
I called and spoke to [place] again. His mother is still not home. He is going to have her call me if she has any questions.

## 2020-03-17 NOTE — Telephone Encounter (Signed)
  IMPRESSION:   MRI cervical spine (without) demonstrating: - Degenerative spondylosis with spinal stenosis and foraminal stenosis as above from C3-C7 levels.   Please call patient, MRI of the cervical spine showed multilevel degenerative changes, there was no evidence of spinal cord compression, variable degree of foraminal narrowing,  EMG nerve conduction study showed features suggestive of multifocal motor neuropathy, will start with IVIG treatment, 2 g/kg divided into 4 days, followed by 1 g/kg every 4 weeks,

## 2020-03-18 NOTE — Telephone Encounter (Signed)
  [place],Marie(Pt's mother) has called Sharyn Lull, RN back.please call

## 2020-03-18 NOTE — Telephone Encounter (Signed)
I spoke to the patient's mother and she is in agreement to move forward with IVIG. She is aware to expect a call for scheduling.

## 2020-04-01 NOTE — Telephone Encounter (Signed)
[facility] has concerns about infusing IVIG in our suite. They recommended HH infusions. I spoke with Dr. Krista Blue and she is agreeable to this.   I called pt's mother, Andrew [name], per DPR. I advised her of this change and she is very appreciative since they do not have transportation. I advised her that she will be getting a call from an agency to schedule the IVIG infusions.  I will work with [facility] to get pt set up.

## 2020-04-01 NOTE — Telephone Encounter (Signed)
Faxed referral and order to [place].

## 2020-04-04 LAB — FUNGUS CULTURE W SMEAR
CULTURE:: NO GROWTH
MICRO NUMBER:: 10351490
SMEAR:: NONE SEEN
SPECIMEN QUALITY:: ADEQUATE

## 2020-04-04 LAB — CSF CELL COUNT WITH DIFFERENTIAL
RBC Count, CSF: 1 cells/uL — ABNORMAL HIGH
WBC, CSF: 0 cells/uL (ref 0–5)

## 2020-04-04 LAB — GRAM STAIN
MICRO NUMBER:: 10351489
SPECIMEN QUALITY:: ADEQUATE

## 2020-04-04 LAB — GLUCOSE, CSF: Glucose, CSF: 57 mg/dL (ref 40–80)

## 2020-04-04 LAB — VDRL, CSF: VDRL Quant, CSF: NONREACTIVE

## 2020-04-04 LAB — PROTEIN, CSF: Total Protein, CSF: 36 mg/dL (ref 15–45)

## 2020-04-24 ENCOUNTER — Inpatient Hospital Stay (HOSPITAL_COMMUNITY)
Admission: EM | Admit: 2020-04-24 | Discharge: 2020-04-26 | DRG: 809 | Disposition: A | Discharge status: Home / Self Care | Payer: Medicare Other | Attending: Internal Medicine | Admitting: Internal Medicine

## 2020-04-24 ENCOUNTER — Telehealth: Payer: Self-pay | Admitting: Neurology

## 2020-04-24 ENCOUNTER — Other Ambulatory Visit: Payer: Self-pay

## 2020-04-24 ENCOUNTER — Encounter (HOSPITAL_COMMUNITY): Payer: Self-pay | Admitting: Internal Medicine

## 2020-04-24 DIAGNOSIS — G40209 Localization-related (focal) (partial) symptomatic epilepsy and epileptic syndromes with complex partial seizures, not intractable, without status epilepticus: Secondary | ICD-10-CM | POA: Diagnosis present

## 2020-04-24 DIAGNOSIS — T50Z15A Adverse effect of immunoglobulin, initial encounter: Secondary | ICD-10-CM | POA: Diagnosis present

## 2020-04-24 DIAGNOSIS — Z20822 Contact with and (suspected) exposure to covid-19: Secondary | ICD-10-CM | POA: Diagnosis not present

## 2020-04-24 DIAGNOSIS — Z8249 Family history of ischemic heart disease and other diseases of the circulatory system: Secondary | ICD-10-CM | POA: Diagnosis not present

## 2020-04-24 DIAGNOSIS — D596 Hemoglobinuria due to hemolysis from other external causes: Secondary | ICD-10-CM | POA: Diagnosis not present

## 2020-04-24 DIAGNOSIS — I10 Essential (primary) hypertension: Secondary | ICD-10-CM | POA: Diagnosis present

## 2020-04-24 DIAGNOSIS — Z87891 Personal history of nicotine dependence: Secondary | ICD-10-CM

## 2020-04-24 DIAGNOSIS — F319 Bipolar disorder, unspecified: Secondary | ICD-10-CM | POA: Diagnosis present

## 2020-04-24 DIAGNOSIS — R269 Unspecified abnormalities of gait and mobility: Secondary | ICD-10-CM | POA: Diagnosis present

## 2020-04-24 DIAGNOSIS — R17 Unspecified jaundice: Secondary | ICD-10-CM | POA: Diagnosis present

## 2020-04-24 DIAGNOSIS — I161 Hypertensive emergency: Secondary | ICD-10-CM

## 2020-04-24 DIAGNOSIS — R7402 Elevation of levels of lactic acid dehydrogenase (LDH): Secondary | ICD-10-CM | POA: Diagnosis present

## 2020-04-24 DIAGNOSIS — R531 Weakness: Secondary | ICD-10-CM | POA: Diagnosis not present

## 2020-04-24 DIAGNOSIS — R5381 Other malaise: Secondary | ICD-10-CM | POA: Diagnosis not present

## 2020-04-24 LAB — COMPREHENSIVE METABOLIC PANEL
ALT: 27 U/L (ref 0–44)
AST: 23 U/L (ref 15–41)
Albumin: 3.3 g/dL — ABNORMAL LOW (ref 3.5–5.0)
Alkaline Phosphatase: 64 U/L (ref 38–126)
Anion gap: 7 (ref 5–15)
BUN: 15 mg/dL (ref 6–20)
CO2: 22 mmol/L (ref 22–32)
Calcium: 9 mg/dL (ref 8.9–10.3)
Chloride: 104 mmol/L (ref 98–111)
Creatinine, Ser: 1.45 mg/dL — ABNORMAL HIGH (ref 0.61–1.24)
GFR calc Af Amer: >60 mL/min (ref >60)
GFR calc non Af Amer: 57 mL/min — ABNORMAL LOW (ref >60)
Glucose, Bld: 96 mg/dL (ref 70–99)
Potassium: 4.7 mmol/L (ref 3.5–5.1)
Sodium: 133 mmol/L — ABNORMAL LOW (ref 135–145)
Total Bilirubin: 2.6 mg/dL — ABNORMAL HIGH (ref 0.3–1.2)
Total Protein: 8.4 g/dL — ABNORMAL HIGH (ref 6.5–8.1)

## 2020-04-24 LAB — CBC WITH DIFFERENTIAL/PLATELET
Abs Immature Granulocytes: 0.1 10*3/uL — ABNORMAL HIGH (ref 0.00–0.07)
Basophils Absolute: 0.1 10*3/uL (ref 0.0–0.1)
Basophils Relative: 1 %
Eosinophils Absolute: 0.6 10*3/uL — ABNORMAL HIGH (ref 0.0–0.5)
Eosinophils Relative: 7 %
HCT: 45.1 % (ref 39.0–52.0)
Hemoglobin: 14.7 g/dL (ref 13.0–17.0)
Immature Granulocytes: 1 %
Lymphocytes Relative: 25 %
Lymphs Abs: 2 10*3/uL (ref 0.7–4.0)
MCH: 29.8 pg (ref 26.0–34.0)
MCHC: 32.6 g/dL (ref 30.0–36.0)
MCV: 91.3 fL (ref 80.0–100.0)
Monocytes Absolute: 0.7 10*3/uL (ref 0.1–1.0)
Monocytes Relative: 9 %
Neutro Abs: 4.7 10*3/uL (ref 1.7–7.7)
Neutrophils Relative %: 57 %
Platelets: 203 10*3/uL (ref 150–400)
RBC: 4.94 MIL/uL (ref 4.22–5.81)
RDW: 15.2 % (ref 11.5–15.5)
WBC: 8.2 10*3/uL (ref 4.0–10.5)
nRBC: 0.4 % — ABNORMAL HIGH (ref 0.0–0.2)

## 2020-04-24 LAB — URINALYSIS, MICROSCOPIC (REFLEX)

## 2020-04-24 LAB — PROTIME-INR
INR: 1.1 (ref 0.8–1.2)
Prothrombin Time: 13.7 seconds (ref 11.4–15.2)

## 2020-04-24 LAB — CBC
HCT: 44.2 % (ref 39.0–52.0)
Hemoglobin: 13.9 g/dL (ref 13.0–17.0)
MCH: 28.3 pg (ref 26.0–34.0)
MCHC: 31.4 g/dL (ref 30.0–36.0)
MCV: 90 fL (ref 80.0–100.0)
Platelets: 207 10*3/uL (ref 150–400)
RBC: 4.91 MIL/uL (ref 4.22–5.81)
RDW: 13.9 % (ref 11.5–15.5)
WBC: 7.7 10*3/uL (ref 4.0–10.5)
nRBC: 0 % (ref 0.0–0.2)

## 2020-04-24 LAB — TYPE AND SCREEN
ABO/RH(D): AB POS
Antibody Screen: NEGATIVE
PT AG Type: NEGATIVE

## 2020-04-24 LAB — CK: Total CK: 308 U/L (ref 49–397)

## 2020-04-24 LAB — HIV ANTIBODY (ROUTINE TESTING W REFLEX): HIV Screen 4th Generation wRfx: NONREACTIVE

## 2020-04-24 LAB — URINALYSIS, ROUTINE W REFLEX MICROSCOPIC

## 2020-04-24 LAB — MAGNESIUM: Magnesium: 2 mg/dL (ref 1.7–2.4)

## 2020-04-24 LAB — RETICULOCYTES
Immature Retic Fract: 16.5 % — ABNORMAL HIGH (ref 2.3–15.9)
RBC.: 4.82 MIL/uL (ref 4.22–5.81)
Retic Count, Absolute: 72.8 10*3/uL (ref 19.0–186.0)
Retic Ct Pct: 1.5 % (ref 0.4–3.1)

## 2020-04-24 LAB — SARS CORONAVIRUS 2 BY RT PCR (HOSPITAL ORDER, PERFORMED IN ~~LOC~~ HOSPITAL LAB): SARS Coronavirus 2: NEGATIVE

## 2020-04-24 LAB — SAVE SMEAR(SSMR), FOR PROVIDER SLIDE REVIEW

## 2020-04-24 LAB — APTT: aPTT: 26 seconds (ref 24–36)

## 2020-04-24 LAB — BILIRUBIN, DIRECT: Bilirubin, Direct: 0.4 mg/dL — ABNORMAL HIGH (ref 0.0–0.2)

## 2020-04-24 LAB — TSH: TSH: 1.624 u[IU]/mL (ref 0.350–4.500)

## 2020-04-24 LAB — DIRECT ANTIGLOBULIN TEST (NOT AT ARMC)
DAT, IgG: POSITIVE
DAT, complement: NEGATIVE

## 2020-04-24 LAB — LACTATE DEHYDROGENASE: LDH: 656 U/L — ABNORMAL HIGH (ref 98–192)

## 2020-04-24 LAB — LIPASE, BLOOD: Lipase: 42 U/L (ref 11–51)

## 2020-04-24 LAB — PHOSPHORUS: Phosphorus: 2.8 mg/dL (ref 2.5–4.6)

## 2020-04-24 MED ORDER — FLUTICASONE PROPIONATE 50 MCG/ACT NA SUSP
1.0000 | Freq: Every day | NASAL | Status: DC | PRN
  Filled 2020-04-24: qty 16

## 2020-04-24 MED ORDER — BUSPIRONE HCL 10 MG PO TABS
15.0000 mg | ORAL_TABLET | Freq: Three times a day (TID) | ORAL | Status: DC
  Administered 2020-04-24 – 2020-04-26 (×5): 15 mg via ORAL
  Filled 2020-04-24 (×5): qty 2

## 2020-04-24 MED ORDER — LABETALOL HCL 5 MG/ML IV SOLN
20.0000 mg | Freq: Once | INTRAVENOUS | Status: AC
  Administered 2020-04-24: 20 mg via INTRAVENOUS
  Filled 2020-04-24: qty 4

## 2020-04-24 MED ORDER — ACETAMINOPHEN 325 MG PO TABS: 650.0000 mg | ORAL_TABLET | Freq: Four times a day (QID) | ORAL | Status: DC | PRN

## 2020-04-24 MED ORDER — LAMOTRIGINE 150 MG PO TABS
300.0000 mg | ORAL_TABLET | Freq: Two times a day (BID) | ORAL | Status: DC
  Administered 2020-04-25 – 2020-04-26 (×3): 300 mg via ORAL
  Filled 2020-04-24 (×4): qty 2

## 2020-04-24 MED ORDER — SODIUM CHLORIDE 0.9 % IV SOLN: INTRAVENOUS | Status: AC

## 2020-04-24 MED ORDER — SODIUM CHLORIDE 0.9 % IV BOLUS
1000.0000 mL | Freq: Once | INTRAVENOUS | Status: AC
  Administered 2020-04-24: 1000 mL via INTRAVENOUS

## 2020-04-24 MED ORDER — LABETALOL HCL 5 MG/ML IV SOLN
5.0000 mg | INTRAVENOUS | Status: DC | PRN
  Administered 2020-04-24 – 2020-04-25 (×2): 5 mg via INTRAVENOUS
  Filled 2020-04-24 (×2): qty 4

## 2020-04-24 MED ORDER — LABETALOL HCL 5 MG/ML IV SOLN: 5.0000 mg | Freq: Once | INTRAVENOUS | Status: DC

## 2020-04-24 MED ORDER — LABETALOL HCL 5 MG/ML IV SOLN: 5.0000 mg | INTRAVENOUS | Status: DC | PRN

## 2020-04-24 MED ORDER — LABETALOL HCL 5 MG/ML IV SOLN: 10.0000 mg | Freq: Once | INTRAVENOUS | Status: DC

## 2020-04-24 MED ORDER — ONDANSETRON HCL 4 MG PO TABS: 4.0000 mg | ORAL_TABLET | Freq: Four times a day (QID) | ORAL | Status: DC | PRN

## 2020-04-24 MED ORDER — FOLIC ACID 1 MG PO TABS
1.0000 mg | ORAL_TABLET | Freq: Every day | ORAL | Status: DC
  Administered 2020-04-25 – 2020-04-26 (×2): 1 mg via ORAL
  Filled 2020-04-24 (×2): qty 1

## 2020-04-24 MED ORDER — ACETAMINOPHEN 650 MG RE SUPP: 650.0000 mg | Freq: Four times a day (QID) | RECTAL | Status: DC | PRN

## 2020-04-24 MED ORDER — ONDANSETRON HCL 4 MG/2ML IJ SOLN: 4.0000 mg | Freq: Four times a day (QID) | INTRAMUSCULAR | Status: DC | PRN

## 2020-04-24 MED ORDER — HEPARIN SODIUM (PORCINE) 5000 UNIT/ML IJ SOLN
5000.0000 [IU] | Freq: Three times a day (TID) | INTRAMUSCULAR | Status: DC
  Administered 2020-04-24 – 2020-04-26 (×5): 5000 [IU] via SUBCUTANEOUS
  Filled 2020-04-24 (×5): qty 1

## 2020-04-24 MED ORDER — LACOSAMIDE 200 MG PO TABS
400.0000 mg | ORAL_TABLET | Freq: Two times a day (BID) | ORAL | Status: DC
  Administered 2020-04-24 – 2020-04-26 (×4): 400 mg via ORAL
  Filled 2020-04-24 (×4): qty 2

## 2020-04-24 NOTE — H&P (Addendum)
Date: 04/24/2020               Patient Name:  Andrew [name] MRN: [id number]  DOB: 1971/11/08 Age / Sex: 50 y.o., male   PCP: Andrew Snow, FNP              Medical Service: Internal Medicine Teaching Service              Attending Physician: Dr. Aldine Contes, MD    First Contact: Andrew [name], Andrew [name] 3 Pager: [phone]  Second Contact: Dr. Al [name] Pager: [phone]  Third Contact Dr. Molli [name] Pager: [phone]       After Hours (After 5p/  First Contact Pager: [phone]  weekends / holidays): Second Contact Pager: [phone]   Chief Complaint: Hematuria  History of Present Illness:  Andrew [name] is a 49 year old male with a past medical history significant for seizures who presented to the emergency department with hypertension and hematuria in setting of recent IVIG infusions. History obtained from patient and patient's mother who was bedside.  Patient initially noticed new-onset dark red coloration of urine this morning as he was receiving his third dose of IVIG infusion this morning. The home health nurse instructed patient to come to the hospital as his BP was 212/150. On arrival patient's BP was 196/164. Patient's hypertension increased to 241/176 in the emergency department. Patient has no past medical history of hypertension or hematuria. Patient reports that he has been hypertensive during IVIG infusions since last Tuesday (systolic BP [id number]). Patient reports that he was experiencing headaches. Patient denies any fevers, chills, lightheadedness, dizziness, changes in vision, recent head trauma, recent falls, chest pain, back pain, abdominal pain, suprapubic pain, or dysuria.  ED course: Patient was given 20 mg IV labetalol for hypertension. CBC with differential, CMP, CK, TSH, Lipase,  and urinalysis ordered.  Meds: Current Meds  Medication Sig  . busPIRone (BUSPAR) 15 MG tablet Take 15 mg by mouth 3 (three) times daily.   . fluticasone (FLONASE) 50 MCG/ACT nasal spray  Place 1 spray into both nostrils daily as needed for allergies or rhinitis.  Marland Kitchen lacosamide (VIMPAT) 200 MG TABS tablet Take 2 tablets (400 mg total) by mouth 2 (two) times daily.  . LamoTRIgine 300 MG TB24 24 hour tablet Take 2 tablets (600 mg total) by mouth at bedtime.  . Zinc 100 MG TABS Take 100 mg by mouth daily.    Allergies: Allergies as of 04/24/2020  . (No Known Allergies)   Past Medical History:  Diagnosis Date  . Anxiety   . Depression   . Seizures ([place])     Family History: Patient's mother has hypertension.  Social History: Patient lives at home with his mother. Patient does not drink alcohol. He reports that he used to smoke cigarettes, but no longer smokes. Patient does not use any illegal/illicit substance use.  Review of Systems: A complete ROS was negative except as per HPI.  Physical Exam: Blood pressure (!) 174/110, pulse 72, temperature 99 F (37.2 C), temperature source Oral, resp. rate (!) 26, height 5\' 6"  (1.676 m), weight 111.1 kg, SpO2 98 %. Physical Exam  Constitutional: He is oriented to person, place, and time. No distress.  HENT:  Head: Normocephalic and atraumatic.  Eyes: Pupils are equal, round, and reactive to light. Conjunctivae and EOM are normal. No scleral icterus.  Cardiovascular: Normal rate, regular rhythm and normal heart sounds. Exam reveals no gallop and no friction rub.  No murmur heard. Pulmonary/Chest: Effort normal and breath  sounds normal. No respiratory distress. He has no wheezes. He has no rales.  Abdominal: Soft. Bowel sounds are normal. He exhibits no distension.  Musculoskeletal:        General: Normal range of motion.  Neurological: He is alert and oriented to person, place, and time.  Skin: Skin is warm and dry. He is not diaphoretic.  Psychiatric: Mood and affect normal.   EKG: personally reviewed my interpretation is sinus rhythm.  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  # Hypertension. # Hematuria. Patient has been receiving IVIG infusions for progressively worsening gait abnormalities. Patient reported that he has had some hypertension during infusions since last Tuesday. He reports that this morning he noticed some red-colored urine, without dysuria or back pain and no suprapubic tenderness on physical examination. Patient's highest BP has been 241/176 at 1301. Received IV Labetalol 20 mg at 1330. Most recent BP is 193/118. Urinalysis revealed 11-20 RBC/hpf without leukocytes or bacteria. Total Bilirubin elevated at 2.6. Hemoglobin is 14.7. CK was within normal limits at 308. Patient denies any fevers, chills, lightheadedness, dizziness, changes in vision, recent head trauma, recent falls, chest pain, back pain, abdominal pain, suprapubic pain, or dysuria. No scleral icterus on physical exam. Assessment: The etiology of this acute hypertension and hematuria is most likely IVIG infusions. Hematuria in this setting is concerning for hemolytic anemia. Initial CBC revealed hemoglobin within normal limited. Will need follow up CBC and additional workup for hemolytic anemia. Labetalol improving patients BP, however patient is still hypertensive at this time. Will need continuous telemetry and BP monitoring. Plan: - IV Labetalol 5 mg PRN for BP >180. - Telemetry. - Continuous BP monitoring. - CBC with differential.  - Peripheral blood smear. - Haptoglobin level. - Lactate dehydrogenase level. - Reticulocyte count. - Direct antiglobulin test. - INR and aPTT. - Indirect bilirubin. - Repeat CMP.   # Seizures. # Gait Abnormality. Patient has history of seizures since he was 49 years old. Since 2019, he began experiencing right hand weakness and gait disturbances. Is being followed outpatient by  Dr. Krista [name]. Patient denies any seizures within the past week. Plan: - Continue home ER Lamotrigine 300 mg: 2 tablets at nighttime. - Continue home Vimpat 200 mg: 2 tablets twice a day. - Continue home Buspirone 15 mg: three times daily.  Dispo: Admit patient to Inpatient with expected length of stay greater than 2 midnights.  Signed: Nanetta [name], Medical Student 04/24/2020, 4:16 PM   Attestation for Student Documentation:  I personally was present and performed or re-performed the history, physical exam and medical decision-making activities of this service and have verified that the service and findings are accurately documented in the student's note.  Andrew Decant, MD 04/24/2020, 6:13 PM

## 2020-04-24 NOTE — ED Notes (Signed)
Dinner ordered 

## 2020-04-24 NOTE — Telephone Encounter (Signed)
The patient was getting Gammagard 10%. He received a full infusion on 6/1 & 6/2. He received about 74ml on 6/3 before the RN stopped it due to his elevated BP. That is also when the patient reported the blood in his urine. Each infusion day, he was pre-medicated with Tylenol 650mg  po and Benadryl 25mg  po. The loading dose planned was 200mg  divided over 4 days. He was on a titrating rate, starting at 78ml/hr and working up to 115ml/hr.  She is aware that Dr. Krista Blue is holding his treatment.

## 2020-04-24 NOTE — Telephone Encounter (Signed)
Gae Bon PA called to request a CB from Dr.Yan to discuss pt as he is currently admitted at the ED

## 2020-04-24 NOTE — Telephone Encounter (Signed)
I have spoken with Caryl Pina, RN. He had gross hematuria this morning and EMS was called. Caryl Pina reports his BP was  212/150 when they arrived. He has completed two IVIG infusions and only received 58ml of medication today prior to his BP going up.

## 2020-04-24 NOTE — ED Provider Notes (Addendum)
[place] [facility] EMERGENCY DEPARTMENT Provider Note   CSN: [id number] Arrival date & time: 04/24/20  1050     History Chief Complaint  Patient presents with  . Hematuria    Andrew [name] is a 49 y.o. male with history of epilepsy, bipolar disorder presents for evaluation of acute onset, persistent hematuria beginning just prior to arrival.  He has a history of intractable seizure disorder since he was 49 years old which manifests as multiple complex partial seizures daily.  He follows with Dr. Krista Blue with neurology.  Over the years he has developed progressively worsening gait abnormality, right worse than left hand weakness.  They decided to proceed with IVIG Gammagard infusions that started on Tuesday, 04/22/2020.  He has had daily infusions since then and today was his third.  He reports that during the infusion he developed hematuria.  He states that earlier this morning when he urinated his urine was normal.  He denies dysuria, urgency or frequency.  He denies abdominal pain, nausea, vomiting, chest pain or shortness of breath.  He reports that last night he had a frontal headache which was "a little more severe" from his baseline headaches but it resolved with Tylenol and has not recurred.  He denies any fever or neck stiffness.  In the ED he is noted to be hypertensive and states that he has no history of hypertension.  The history is provided by the patient and medical records.       Past Medical History:  Diagnosis Date  . Anxiety   . Depression   . Seizures [facility])     Patient Active Problem List   Diagnosis Date Noted  . 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 Neuropathy 02/18/2020  . Weakness 07/12/2019  . Cervical stenosis of spinal canal 10/04/2018  . Gait abnormality 10/04/2018  . Partial symptomatic epilepsy with complex partial seizures, not intractable, without status epilepticus ([place]) 12/24/2016  . Bipolar disorder ([place]) 12/24/2016  .  Seizures ([place])   . Anxiety     Past Surgical History:  Procedure Laterality Date  . none         Family History  Problem Relation Age of Onset  . Asthma Father     Social History   Tobacco Use  . Smoking status: Current Every Day Smoker    Packs/day: 0.50    Years: 20.00    Pack years: 10.00    Types: Cigarettes  . Smokeless tobacco: Never Used  Substance Use Topics  . Alcohol use: No    Alcohol/week: 0.0 standard drinks  . Drug use: No    Comment: 11/08/16 - reports he has stopped smoking marijuana.    Home Medications Prior to Admission medications   Medication Sig Start Date End Date Taking? Authorizing Provider  busPIRone (BUSPAR) 15 MG tablet Take 15 mg by mouth 3 (three) times daily.    Yes [provider]  fluticasone (FLONASE) 50 MCG/ACT nasal spray Place 1 spray into both nostrils daily as needed for allergies or rhinitis.   Yes [provider]  lacosamide (VIMPAT) 200 MG TABS tablet Take 2 tablets (400 mg total) by mouth 2 (two) times daily. 01/15/20  Yes Marcial Pacas, MD  LamoTRIgine 300 MG TB24 24 hour tablet Take 2 tablets (600 mg total) by mouth at bedtime. 01/15/20  Yes Marcial Pacas, MD  Zinc 100 MG TABS Take 100 mg by mouth daily.   Yes [provider]    Allergies  Patient has no known allergies.  Review of Systems   Review of Systems  Constitutional: Negative for chills and fever.  Respiratory: Negative for shortness of breath.   Cardiovascular: Negative for chest pain.  Gastrointestinal: Negative for abdominal pain.  Genitourinary: Negative for difficulty urinating, dysuria, frequency and urgency.       +urine color change  Neurological: Positive for seizures (Seizure disorder), weakness (Chronic) and headaches (Resolved).  All other systems reviewed and are negative.   Physical Exam Updated Vital Signs BP (!) 174/110   Pulse 72   Temp 99 F (37.2 C) (Oral)   Resp (!) 26   Ht 5\' 6"  (1.676 m)   Wt 111.1 kg    SpO2 98%   BMI 39.54 kg/m   Physical Exam Vitals and nursing note reviewed.  Constitutional:      General: He is not in acute distress.    Appearance: He is well-developed.  HENT:     Head: Normocephalic and atraumatic.  Eyes:     General:        Right eye: No discharge.        Left eye: No discharge.     Extraocular Movements: Extraocular movements intact.     Conjunctiva/sclera: Conjunctivae normal.     Pupils: Pupils are equal, round, and reactive to light.  Neck:     Vascular: No JVD.     Trachea: No tracheal deviation.  Cardiovascular:     Rate and Rhythm: Normal rate and regular rhythm.     Pulses: Normal pulses.     Heart sounds: Normal heart sounds.  Pulmonary:     Effort: Pulmonary effort is normal.     Breath sounds: Normal breath sounds.  Abdominal:     General: There is no distension.     Palpations: Abdomen is soft.     Tenderness: There is no abdominal tenderness. There is no guarding or rebound.  Musculoskeletal:     Cervical back: Neck supple.  Skin:    General: Skin is warm and dry.     Findings: No erythema.  Neurological:     Mental Status: He is alert and oriented to person, place, and time.     Comments: Chronic right hand weakness with grip and wrist flexion and extension compared to left upper extremity.  Otherwise 5/5 strength of upper and lower extremity major muscle groups.  There is dysmetria with finger-to-nose of the right upper extremity.  Sensation intact to light touch of face and extremities.  No cranial nerve deficit noted.  Psychiatric:        Behavior: Behavior normal.     ED Results / Procedures / Treatments   Labs (all labs ordered are listed, but only abnormal results are displayed) Labs Reviewed  COMPREHENSIVE METABOLIC PANEL - Abnormal; Notable for the following components:      Result Value   Sodium 133 (*)    Creatinine, Ser 1.45 (*)    Total Protein 8.4 (*)    Albumin 3.3 (*)    Total Bilirubin 2.6 (*)    GFR calc non  Af Amer 57 (*)    All other components within normal limits  CBC WITH DIFFERENTIAL/PLATELET - Abnormal; Notable for the following components:   nRBC 0.4 (*)    Eosinophils Absolute 0.6 (*)    Abs Immature Granulocytes 0.10 (*)    All other components within normal limits  URINALYSIS, ROUTINE W REFLEX MICROSCOPIC - Abnormal; Notable for the following components:   Color, Urine  RED (*)    Glucose, UA   (*)    Value: TEST NOT REPORTED DUE TO COLOR INTERFERENCE OF URINE PIGMENT   Hgb urine dipstick   (*)    Value: TEST NOT REPORTED DUE TO COLOR INTERFERENCE OF URINE PIGMENT   Bilirubin Urine   (*)    Value: TEST NOT REPORTED DUE TO COLOR INTERFERENCE OF URINE PIGMENT   Ketones, ur   (*)    Value: TEST NOT REPORTED DUE TO COLOR INTERFERENCE OF URINE PIGMENT   Protein, ur   (*)    Value: TEST NOT REPORTED DUE TO COLOR INTERFERENCE OF URINE PIGMENT   Nitrite   (*)    Value: TEST NOT REPORTED DUE TO COLOR INTERFERENCE OF URINE PIGMENT   Leukocytes,Ua   (*)    Value: TEST NOT REPORTED DUE TO COLOR INTERFERENCE OF URINE PIGMENT   All other components within normal limits  URINALYSIS, MICROSCOPIC (REFLEX) - Abnormal; Notable for the following components:   Bacteria, UA RARE (*)    All other components within normal limits  SARS CORONAVIRUS 2 BY RT PCR (HOSPITAL ORDER, [facility])  URINE CULTURE  LIPASE, BLOOD  TSH  CK  BILIRUBIN, DIRECT  HIV ANTIBODY (ROUTINE TESTING W REFLEX)  MAGNESIUM  PHOSPHORUS  APTT  PROTIME-INR  RETICULOCYTES  LACTATE DEHYDROGENASE  HAPTOGLOBIN  CBC  SAVE SMEAR ([facility])  PATHOLOGIST SMEAR REVIEW  COMPREHENSIVE METABOLIC PANEL  CBC WITH DIFFERENTIAL/PLATELET  DIRECT ANTIGLOBULIN TEST (NOT AT [facility])    EKG EKG Interpretation  Date/Time:  Thursday April 24 2020 11:28:19 EDT Ventricular Rate:  68 PR Interval:    QRS Duration: 96 QT Interval:  377 QTC Calculation: 401 R Axis:   -17 Text Interpretation: Sinus rhythm Left  ventricular hypertrophy Confirmed by Lajean Saver [id number]) on 04/24/2020 12:50:11 PM   Radiology No results found.  Procedures .Critical Care Performed by: Renita Papa, PA-C Authorized by: Renita Papa, PA-C   Critical care provider statement:    Critical care time (minutes):  45   Critical care was necessary to treat or prevent imminent or life-threatening deterioration of the following conditions:  Circulatory failure   Critical care was time spent personally by me on the following activities:  Discussions with consultants, evaluation of patient's response to treatment, examination of patient, ordering and performing treatments and interventions, ordering and review of laboratory studies, ordering and review of radiographic studies, pulse oximetry, re-evaluation of patient's condition, obtaining history from patient or surrogate and review of old charts   (including critical care time)  Medications Ordered in ED Medications  acetaminophen (TYLENOL) tablet 650 mg (has no administration in time range)    Or  acetaminophen (TYLENOL) suppository 650 mg (has no administration in time range)  ondansetron (ZOFRAN) tablet 4 mg (has no administration in time range)    Or  ondansetron (ZOFRAN) injection 4 mg (has no administration in time range)  0.9 %  sodium chloride infusion (has no administration in time range)  lamoTRIgine (LAMICTAL) tablet 300 mg (has no administration in time range)  lacosamide (VIMPAT) tablet 400 mg (has no administration in time range)  busPIRone (BUSPAR) tablet 15 mg (has no administration in time range)  fluticasone (FLONASE) 50 MCG/ACT nasal spray 1 spray (has no administration in time range)  labetalol (NORMODYNE) injection 5 mg (has no administration in time range)  labetalol (NORMODYNE) injection 5 mg (has no administration in time range)  folic acid (FOLVITE) tablet 1 mg (has no administration  in time range)  labetalol (NORMODYNE) injection 20 mg (20 mg  Intravenous Given 04/24/20 1330)  sodium chloride 0.9 % bolus 1,000 mL (1,000 mLs Intravenous New Bag/Given 04/24/20 1413)    ED Course  I have reviewed the triage vital signs and the nursing notes.  Pertinent labs & imaging results that were available during my care of the patient were reviewed by me and considered in my medical decision making (see chart for details).  Clinical Course as of Apr 25 1707  Thu Apr 24, 2020  1517 Total Bilirubin(!): 2.6 [MF]    Clinical Course User Index [MF] Debroah Baller   MDM Rules/Calculators/A&P                      Patient presents for evaluation of hypertension, dark urine.  He is afebrile, persistently hypertensive in the ED.  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  This was terminated early due to rising blood pressures.  Patient also mentions that his urine is dark today.  He had a headache yesterday but this resolved with Tylenol.  Today his neurologic examination is at his baseline.  He is nontoxic in appearance.  He denies chest pain, abdominal pain and the rest of his physical examination is reassuring.  UA difficult to analyze due to color but has some microscopic hematuria and rare bacteria, not particularly concerning for UTI.  Remainder lab work reviewed and interpreted by myself shows no leukocytosis, no thrombocytopenia or anemia.  He has mildly elevated creatinine which appears to be baseline.  Interestingly, his total bilirubin is acutely elevated but the remainder of his LFTs are within normal limits and given reassuring abdominal examination I doubt acute surgical abdominal pathology including cholecystitis or choledocholithiasis.  His lipase is also within normal limits.  Patient was given IV labetalol in the ED with some improvement in his blood pressures.  On reevaluation he is resting comfortably in no apparent distress.  He reports no new symptoms.  1:29 PM CONSULT: I  spoke with Dr. Krista Blue, the patient's neurologist over the phone.  She suspects that the patient's blood pressure and hyperbilirubinemia are in the setting of his IVIG infusions.  She recommends stopping the IVIG treatment and agrees with plan for admission for management of the patient's hypertension.  She recommends keeping the patient well-hydrated also recommends obtaining CK and TSH for further evaluation and management.  Spoke with Dr. Sharon Seller with internal medicine teaching service who agrees to assume care of patient and bring him to the hospital for further evaluation and management.  Final Clinical Impression(s) / ED Diagnoses Final diagnoses:  Hypertensive emergency  Hyperbilirubinemia    Rx / DC Orders ED Discharge Orders    None         Renita Papa, PA-C 04/24/20 1708    Lajean Saver, MD 04/25/20 [phone]

## 2020-04-24 NOTE — ED Triage Notes (Signed)
Pt sent in by [facility] nurse with c/o new onset hematuria this AM, which pt states he has not had before.  Pt is receiving an IV infusion of Gammagard 10% which was his second dose.  Nurse stopped infusion and sent him to ED.  No reported hx of HTN and BP was elevated at home.  Pt denies any pain, reports a HA last PM but none today.  Treated with Tylenol with relief.  Pt poor historian.

## 2020-04-24 NOTE — Telephone Encounter (Signed)
Ashley,RN has called to report that during and IV infusion pt was sent by ambulance to ED.  Caryl Pina states pt is due for another tomorrow.  Caryl Pina is asking for a call from [facility]

## 2020-04-24 NOTE — Telephone Encounter (Signed)
I was able to talk with hospitalist the [facility], during his third day of IVIG infusion, patient was noted to have significantly elevated blood pressure, blood pressure was 221/145, urine was dark color, CBC showed no significant abnormality, CMP showed low sodium 133, total bilirubin was significantly elevated 2.6, with normal liver functional test  He will be admitted for hypertensive treatment, also suggested well hydration, check CPK, TSH levels

## 2020-04-24 NOTE — ED Notes (Signed)
This RN called main lab to add on CK and TSH

## 2020-04-25 DIAGNOSIS — D596 Hemoglobinuria due to hemolysis from other external causes: Principal | ICD-10-CM

## 2020-04-25 DIAGNOSIS — I161 Hypertensive emergency: Secondary | ICD-10-CM

## 2020-04-25 LAB — URINE CULTURE: Culture: <10000 — AB

## 2020-04-25 LAB — COMPREHENSIVE METABOLIC PANEL
ALT: 30 U/L (ref 0–44)
AST: 32 U/L (ref 15–41)
Albumin: 3 g/dL — ABNORMAL LOW (ref 3.5–5.0)
Alkaline Phosphatase: 58 U/L (ref 38–126)
Anion gap: 9 (ref 5–15)
BUN: 13 mg/dL (ref 6–20)
CO2: 23 mmol/L (ref 22–32)
Calcium: 8.9 mg/dL (ref 8.9–10.3)
Chloride: 104 mmol/L (ref 98–111)
Creatinine, Ser: 1.36 mg/dL — ABNORMAL HIGH (ref 0.61–1.24)
GFR calc Af Amer: >60 mL/min (ref >60)
GFR calc non Af Amer: >60 mL/min (ref >60)
Glucose, Bld: 95 mg/dL (ref 70–99)
Potassium: 4.9 mmol/L (ref 3.5–5.1)
Sodium: 136 mmol/L (ref 135–145)
Total Bilirubin: 2.1 mg/dL — ABNORMAL HIGH (ref 0.3–1.2)
Total Protein: 7.9 g/dL (ref 6.5–8.1)

## 2020-04-25 LAB — CBC WITH DIFFERENTIAL/PLATELET
Abs Immature Granulocytes: 0.05 10*3/uL (ref 0.00–0.07)
Basophils Absolute: 0.1 10*3/uL (ref 0.0–0.1)
Basophils Relative: 1 %
Eosinophils Absolute: 0.8 10*3/uL — ABNORMAL HIGH (ref 0.0–0.5)
Eosinophils Relative: 10 %
HCT: 42.3 % (ref 39.0–52.0)
Hemoglobin: 13.7 g/dL (ref 13.0–17.0)
Immature Granulocytes: 1 %
Lymphocytes Relative: 26 %
Lymphs Abs: 2 10*3/uL (ref 0.7–4.0)
MCH: 29.1 pg (ref 26.0–34.0)
MCHC: 32.4 g/dL (ref 30.0–36.0)
MCV: 90 fL (ref 80.0–100.0)
Monocytes Absolute: 0.8 10*3/uL (ref 0.1–1.0)
Monocytes Relative: 11 %
Neutro Abs: 4 10*3/uL (ref 1.7–7.7)
Neutrophils Relative %: 51 %
Platelets: 205 10*3/uL (ref 150–400)
RBC: 4.7 MIL/uL (ref 4.22–5.81)
RDW: 14.1 % (ref 11.5–15.5)
WBC: 7.8 10*3/uL (ref 4.0–10.5)
nRBC: 0.3 % — ABNORMAL HIGH (ref 0.0–0.2)

## 2020-04-25 LAB — LACTATE DEHYDROGENASE: LDH: 770 U/L — ABNORMAL HIGH (ref 98–192)

## 2020-04-25 MED ORDER — LABETALOL HCL 5 MG/ML IV SOLN
5.0000 mg | Freq: Once | INTRAVENOUS | Status: AC
  Administered 2020-04-25: 5 mg via INTRAVENOUS
  Filled 2020-04-25: qty 4

## 2020-04-25 MED ORDER — SODIUM CHLORIDE 0.9 % IV SOLN: INTRAVENOUS | Status: DC

## 2020-04-25 NOTE — Consult Note (Signed)
Marland Kitchen    HEMATOLOGY/ONCOLOGY CONSULTATION NOTE  Date of Service: 04/25/2020  Patient Care Team: Lang Snow, FNP as PCP - General (Nurse Practitioner)  CHIEF COMPLAINTS/PURPOSE OF CONSULTATION:  Hemolytic Anemia  HISTORY OF PRESENTING ILLNESS:   Andrew [name] is a wonderful 49 y.o. 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 Patient has a history of intractable seizure disorder since age 35 and follows with Dr. Krista Blue from [facility] neurology for management of this.  His seizures have been complex partial seizures with secondary generalization.  He has required multiple antiseizure medications including Topamax Dilantin, Vimpat and Keppra to try to get his seizures under control.  He is also on several different psychiatric medications for mood disorder and anxiety. Patient has also had issues with worsening gait and concerns with advanced cervical degenerative disease.  He also had recent EMG which showed concerns for multifocal motor neuropathy.  Patient was recently started on IVIG by his neurologist.  Patient's mother notes that while he was getting his third IVIG dose he developed significant elevated blood pressure and red urine.  His blood pressures have been more than 378 systolics and he is requiring a fair amount of medication to have this controlled. Labs showed minimal drop in hemoglobin from 14.7 down to 13.9.  Normal WBC and platelets.  Elevated LDH in the 600s with some mildly increased indirect hyperbilirubinemia.  Haptoglobin pending.  Coombs test positive for IgG.  Patient does not know his own blood group but his mother says she is A Rh positive.   Patient denies fevers, chills, night sweats, new headaches or focal neurological deficits or acute change in mental status.  Renal function relatively stable.  Hematology consulted for concerns with hemolytic anemia.  Patient and his mother  were met at bedside and both note that he is drinking a lot of water and his urine has nearly cleared up with no ongoing significant hematuria.  Blood pressure still fluctuating and running somewhat higher.    MEDICAL HISTORY:  Past Medical History:  Diagnosis Date  . Anxiety   . Depression   . Seizures ([place])     SURGICAL HISTORY: Past Surgical History:  Procedure Laterality Date  . none      SOCIAL HISTORY: Social History   Socioeconomic History  . Marital status: Single    Spouse name: Not on file  . Number of children: 0  . Years of education: 29  . Highest education level: Not on file  Occupational History    Comment: Disabled  Tobacco Use  . Smoking status: Current Every Day Smoker    Packs/day: 0.50    Years: 20.00    Pack years: 10.00    Types: Cigarettes  . Smokeless tobacco: Never Used  Substance and Sexual Activity  . Alcohol use: No    Alcohol/week: 0.0 standard drinks  . Drug use: No    Comment: 11/08/16 - reports he has stopped smoking marijuana.  . Sexual activity: Not on file  Other Topics Concern  . Not on file  Social History Narrative   Patient lives at home with his mother Andrew [name] ). Patient is disabled. Patient has high school education.   Left handed.   Caffeine- soda - pepsi four cans daily.   Patients father died of over dose.-40   Social Determinants of Health   Financial Resource Strain:   . Difficulty of Paying Living Expenses:  Food Insecurity:   . Worried About Charity fundraiser in the Last Year:   . Arboriculturist in the Last Year:   Transportation Needs:   . Film/video editor (Medical):   Marland Kitchen Lack of Transportation (Non-Medical):   Physical Activity:   . Days of Exercise per Week:   . Minutes of Exercise per Session:   Stress:   . Feeling of Stress :   Social Connections:   . Frequency of Communication with Friends and Family:   . Frequency of Social Gatherings with Friends and Family:   . Attends  Religious Services:   . Active Member of Clubs or Organizations:   . Attends Archivist Meetings:   Marland Kitchen Marital Status:   Intimate Partner Violence:   . Fear of Current or Ex-Partner:   . Emotionally Abused:   Marland Kitchen Physically Abused:   . Sexually Abused:     FAMILY HISTORY: Family History  Problem Relation Age of Onset  . Asthma Father     ALLERGIES:  has No Known Allergies.  MEDICATIONS:  Current Facility-Administered Medications  Medication Dose Route Frequency Provider Last Rate Last Admin  . 0.9 %  sodium chloride infusion   Intravenous Continuous Al Decant, MD 125 mL/hr at 04/25/20 1508 Rate Verify at 04/25/20 1508  . acetaminophen (TYLENOL) tablet 650 mg  650 mg Oral Q6H PRN Seawell, Jaimie A, DO       Or  . acetaminophen (TYLENOL) suppository 650 mg  650 mg Rectal Q6H PRN Seawell, Jaimie A, DO      . busPIRone (BUSPAR) tablet 15 mg  15 mg Oral TID Seawell, Jaimie A, DO   15 mg at 04/25/20 1010  . fluticasone (FLONASE) 50 MCG/ACT nasal spray 1 spray  1 spray Each Nare Daily PRN Seawell, Jaimie A, DO      . folic acid (FOLVITE) tablet 1 mg  1 mg Oral Daily Seawell, Jaimie A, DO   1 mg at 04/25/20 1011  . heparin injection 5,000 Units  5,000 Units Subcutaneous Q8H Seawell, Jaimie A, DO   5,000 Units at 04/25/20 1401  . labetalol (NORMODYNE) injection 5 mg  5 mg Intravenous Q2H PRN Seawell, Jaimie A, DO   5 mg at 04/24/20 2045  . lacosamide (VIMPAT) tablet 400 mg  400 mg Oral BID Seawell, Jaimie A, DO   400 mg at 04/25/20 1010  . lamoTRIgine (LAMICTAL) tablet 300 mg  300 mg Oral BID Seawell, Jaimie A, DO   300 mg at 04/25/20 1011  . ondansetron (ZOFRAN) tablet 4 mg  4 mg Oral Q6H PRN Seawell, Jaimie A, DO       Or  . ondansetron (ZOFRAN) injection 4 mg  4 mg Intravenous Q6H PRN Seawell, Jaimie A, DO        REVIEW OF SYSTEMS:    10 Point review of Systems was done is negative except as noted above.  PHYSICAL EXAMINATION: ECOG PERFORMANCE STATUS: 3 - Symptomatic,  >50% confined to bed  . Vitals:   04/25/20 0842 04/25/20 1139  BP: (!) 178/105 (!) 145/98  Pulse: 75 77  Resp: 18 18  Temp: 98.6 F (37 C) 98.3 F (36.8 C)  SpO2: 97% 98%   Filed Weights   04/24/20 1100 04/25/20 0346  Weight: 245 lb (111.1 kg) 240 lb 4.8 oz (109 kg)   .Body mass index is 38.79 kg/m.  GENERAL:alert, in no acute distress and comfortable SKIN: no acute rashes EYES: conjunctiva are pink and non-injected,  sclera anicteric OROPHARYNX: MMM, no exudates, no oropharyngeal erythema or ulceration NECK: supple, no JVD LYMPH:  no palpable lymphadenopathy in the cervical, axillary or inguinal regions LUNGS: clear to auscultation b/l with normal respiratory effort HEART: regular rate & rhythm ABDOMEN:  normoactive bowel sounds , non tender, not distended. Extremity: no pedal edema PSYCH: alert & oriented x 3 with fluent speech NEURO: no focal motor/sensory deficits  LABORATORY DATA:  I have reviewed the data as listed  . CBC Latest Ref Rng & Units 04/25/2020 04/24/2020 04/24/2020  WBC 4.0 - 10.5 K/uL 7.8 7.7 8.2  Hemoglobin 13.0 - 17.0 g/dL 13.7 13.9 14.7  Hematocrit 39.0 - 52.0 % 42.3 44.2 45.1  Platelets 150 - 400 K/uL 205 207 203    . CMP Latest Ref Rng & Units 04/25/2020 04/24/2020 02/19/2020  Glucose 70 - 99 mg/dL 95 96 -  BUN 6 - 20 mg/dL 13 15 -  Creatinine 0.61 - 1.24 mg/dL 1.36(H) 1.45(H) -  Sodium 135 - 145 mmol/L 136 133(L) -  Potassium 3.5 - 5.1 mmol/L 4.9 4.7 -  Chloride 98 - 111 mmol/L 104 104 -  CO2 22 - 32 mmol/L 23 22 -  Calcium 8.9 - 10.3 mg/dL 8.9 9.0 -  Total Protein 6.5 - 8.1 g/dL 7.9 8.4(H) 7.2  Total Bilirubin 0.3 - 1.2 mg/dL 2.1(H) 2.6(H) -  Alkaline Phos 38 - 126 U/L 58 64 -  AST 15 - 41 U/L 32 23 -  ALT 0 - 44 U/L 30 27 -     RADIOGRAPHIC STUDIES: I have personally reviewed the radiological images as listed and agreed with the findings in the report. No results found.  ASSESSMENT & PLAN:   49 year old male with history of seizure  disorder, mood disorder and psychiatric issues, advanced cervical degenerative disc disease and multifocal neuropathy with  #1 Coombs +ve Mild intravascular hemolysis without significant anemia. Newly while getting IVIG.  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 This usually tends to be self-limited and tends to resolve as the culprit isohemagglutinin resolves. Total dose of IVIG and recipient having type a blood group tend to be risk factors.  The possible much less likely to be related to other medications. Plan -History and temporal nature of events reviewed in details. -Available labs discussed in detail with patient with his mother at bedside. -Case was discussed in detail with hospital medicine team. -At this point the patient's urine is clearing up and he has no signs of ongoing significant intravascular hemolysis.  Bilirubin levels are trending down.  No significant drop in hemoglobin levels in the first 24 hours. -Would continue to monitor labs daily to ensure no's significant drop in hemoglobin levels.  Also check reticulocyte count in the morning to ensure adequate bone marrow response and reserves given multiple potential bone marrow suppressive antiseizure meds. -Would recommend putting the patient temporarily on folic acid 1 mg daily to support accelerated hematopoiesis. -No clear role for steroids. -Would hold additional IVIG at this time. -If the patient has very significant/life-threatening hemolysis from his passive alloantibodies from IVIG which seems unlikely--- the treatment consideration would be plasmapheresis to remove these antibodies.  Plasmapheresis is not indicated or recommended at this time. -No significant schistocytes or thrombocytopenia to suggest malignant hypertension related significant microangiopathic hemolysis. -Hematology will sign off please call if additional questions or significant changes.  All of the patients questions were answered with  apparent satisfaction. The patient knows to call the clinic with any problems, questions or concerns.  I  spent 45 minutes counseling the patient face to face. The total time spent in the appointment was 80 minutes and more than 50% was on counseling and direct patient cares.    Sullivan Lone MD [place] AAHIVMS [facility] Hematology/Oncology Physician [facility]  (Office):       [phone] (Work cell):  [phone] (Fax):           [phone]  04/25/2020 3:49 PM

## 2020-04-25 NOTE — Plan of Care (Signed)
  Problem: Coping: Goal: Level of anxiety will decrease Outcome: Progressing   Problem: Pain Managment: Goal: General experience of comfort will improve Outcome: Progressing   Problem: Safety: Goal: Ability to remain free from injury will improve Outcome: Progressing   Problem: Skin Integrity: Goal: Risk for impaired skin integrity will decrease Outcome: Progressing   

## 2020-04-25 NOTE — Discharge Summary (Addendum)
Name: Andrew [name] MRN: [id number] DOB: 1971-10-22 49 y.o. PCP: Lang Snow, FNP  Date of Admission: 04/24/2020 10:50 AM Date of Discharge:  04/26/20  Attending Physician: Aldine Contes, MD  Discharge Diagnosis:  1. Hypertension 2. Hematuria 3. Drug-induced hemolysis 4. Seizures 5. Gait abnormality  Discharge Medications: Allergies as of 04/26/2020   No Known Allergies     Medication List    TAKE these medications   busPIRone 15 MG tablet Commonly known as: BUSPAR Take 15 mg by mouth 3 (three) times daily.   fluticasone 50 MCG/ACT nasal spray Commonly known as: FLONASE Place 1 spray into both nostrils daily as needed for allergies or rhinitis.   folic acid 1 MG tablet Commonly known as: FOLVITE Take 1 tablet (1 mg total) by mouth daily.   lacosamide 200 MG Tabs tablet Commonly known as: Vimpat Take 2 tablets (400 mg total) by mouth 2 (two) times daily.   LamoTRIgine 300 MG Tb24 24 hour tablet Take 2 tablets (600 mg total) by mouth at bedtime.   Zinc 100 MG Tabs Take 100 mg by mouth daily.       Disposition and follow-up:   Andrew [name] was discharged from [facility] in Stable condition.  At the hospital follow up visit please address:   1.  Please check blood pressure. Please ensure patient's hematuria has not returned. Please ensure follow up with PCP and neurology.  2.  Labs / imaging needed at time of follow-up: CBC  3.  Pending labs/ test needing follow-up: na  Follow-up Appointments: Follow-up Information    Lang Snow, FNP Follow up in 1 week(s).   Specialty: Nurse Practitioner Contact information: [place] [address] [phone]        Marcial Pacas, MD Follow up.   Specialty: Neurology Contact information: [place] [address] [phone]           Hospital Course by problem list:  1. Hypertension 2. Hematuria 3. Drug-induced  hemolysis Patient without hx of hypertension developed some increased blood pressures since beginning IVIG infusions for gait abnormalities last week. Initially, the increased BPs were in the 676P systolic. On day of admission, his systolics were over 950 and his diastolics were over 932. This same morning he noted red/brown urine as well. The nurse who was performing the infusion recommended they call EMS. Urinalysis could not be interpreted due to the blood. Hgb was WNL on admission. He had elevated LDH, decreased haptoglobin, elevated total bilirubin and DAT positive for IgG. CK was within normal limits. Patient denied any fevers, chills, lightheadedness, dizziness, changes in vision, recent head trauma, recent falls, chest pain, back pain, abdominal pain, suprapubic pain, or dysuria. No scleral icterus on physical exam. Signs and sx consistent with a drug induced hemolysis. Treated with IV labetalol to bring his blood pressure down. Blood pressure 139/98 on morning of discharge. Given gentle fluids with resolution of his hematuria. On day of discharge his Hgb was WNL and stable, LDH was downtrending and retic count was appropriate. Hematology consult recommended starting folic acid given AED use to ensure proper RBC recovery. Should follow up with PCP outpatient to recheck blood pressure.  4. Seizures Patient has history of seizures since he was 49 years old. Patient denies any seizures within the past week. Continued on home antiepileptics throughout admission.   5. Gait abnormality Since 2019, pt began experiencing right hand weakness and gait disturbances. Patient  was receiving IVIG to try and mitigate this process. IVIG was stopped and likely should not be resumed. Patient should follow up with Dr. Krista Blue outpatient.   Discharge Vitals:   BP (!) 150/95 (BP Location: Left Arm)   Pulse 64   Temp 98.3 F (36.8 C) (Oral)   Resp 18   Ht 5\' 6"  (1.676 m)   Wt 110.3 kg   SpO2 100%   BMI 39.25 kg/m    Pertinent Labs, Studies, and Procedures:  Results for orders placed or performed during the hospital encounter of 04/24/20 (from the past 168 hour(s))  Urinalysis, Routine w reflex microscopic   Collection Time: 04/24/20 11:10 AM  Result Value Ref Range   Color, Urine RED (A) YELLOW   APPearance CLEAR CLEAR   Specific Gravity, Urine  1.005 - 1.030    TEST NOT REPORTED DUE TO COLOR INTERFERENCE OF URINE PIGMENT   pH  5.0 - 8.0    TEST NOT REPORTED DUE TO COLOR INTERFERENCE OF URINE PIGMENT   Glucose, UA (A) NEGATIVE mg/dL    TEST NOT REPORTED DUE TO COLOR INTERFERENCE OF URINE PIGMENT   Hgb urine dipstick (A) NEGATIVE    TEST NOT REPORTED DUE TO COLOR INTERFERENCE OF URINE PIGMENT   Bilirubin Urine (A) NEGATIVE    TEST NOT REPORTED DUE TO COLOR INTERFERENCE OF URINE PIGMENT   Ketones, ur (A) NEGATIVE mg/dL    TEST NOT REPORTED DUE TO COLOR INTERFERENCE OF URINE PIGMENT   Protein, ur (A) NEGATIVE mg/dL    TEST NOT REPORTED DUE TO COLOR INTERFERENCE OF URINE PIGMENT   Nitrite (A) NEGATIVE    TEST NOT REPORTED DUE TO COLOR INTERFERENCE OF URINE PIGMENT   Leukocytes,Ua (A) NEGATIVE    TEST NOT REPORTED DUE TO COLOR INTERFERENCE OF URINE PIGMENT  Urinalysis, Microscopic (reflex)   Collection Time: 04/24/20 11:10 AM  Result Value Ref Range   RBC / HPF 6-10 0 - 5 RBC/hpf   WBC, UA 0-5 0 - 5 WBC/hpf   Bacteria, UA RARE (A) NONE SEEN   Squamous Epithelial / LPF 0-5 0 - 5  Urine culture   Collection Time: 04/24/20 11:25 AM   Specimen: Urine, Random  Result Value Ref Range   Specimen Description URINE, RANDOM    Special Requests NONE    Culture (A)     <10,000 COLONIES/mL INSIGNIFICANT GROWTH Performed at [facility], [address], [place], [place] [address]    Report Status 04/25/2020 FINAL   Comprehensive metabolic panel   Collection Time: 04/24/20 11:29 AM  Result Value Ref Range   Sodium 133 (L) 135 - 145 mmol/L   Potassium 4.7 3.5 - 5.1 mmol/L   Chloride 104 98 -  111 mmol/L   CO2 22 22 - 32 mmol/L   Glucose, Bld 96 70 - 99 mg/dL   BUN 15 6 - 20 mg/dL   Creatinine, Ser 1.45 (H) 0.61 - 1.24 mg/dL   Calcium 9.0 8.9 - 10.3 mg/dL   Total Protein 8.4 (H) 6.5 - 8.1 g/dL   Albumin 3.3 (L) 3.5 - 5.0 g/dL   AST 23 15 - 41 U/L   ALT 27 0 - 44 U/L   Alkaline Phosphatase 64 38 - 126 U/L   Total Bilirubin 2.6 (H) 0.3 - 1.2 mg/dL   GFR calc non Af Amer 57 (L) >60 mL/min   GFR calc Af Amer >60 >60 mL/min   Anion gap 7 5 - 15  CBC with Differential  Collection Time: 04/24/20 11:29 AM  Result Value Ref Range   WBC 8.2 4.0 - 10.5 K/uL   RBC 4.94 4.22 - 5.81 MIL/uL   Hemoglobin 14.7 13.0 - 17.0 g/dL   HCT 45.1 39.0 - 52.0 %   MCV 91.3 80.0 - 100.0 fL   MCH 29.8 26.0 - 34.0 pg   MCHC 32.6 30.0 - 36.0 g/dL   RDW 15.2 11.5 - 15.5 %   Platelets 203 150 - 400 K/uL   nRBC 0.4 (H) 0.0 - 0.2 %   Neutrophils Relative % 57 %   Neutro Abs 4.7 1.7 - 7.7 K/uL   Lymphocytes Relative 25 %   Lymphs Abs 2.0 0.7 - 4.0 K/uL   Monocytes Relative 9 %   Monocytes Absolute 0.7 0.1 - 1.0 K/uL   Eosinophils Relative 7 %   Eosinophils Absolute 0.6 (H) 0.0 - 0.5 K/uL   Basophils Relative 1 %   Basophils Absolute 0.1 0.0 - 0.1 K/uL   Immature Granulocytes 1 %   Abs Immature Granulocytes 0.10 (H) 0.00 - 0.07 K/uL  Lipase, blood   Collection Time: 04/24/20 11:29 AM  Result Value Ref Range   Lipase 42 11 - 51 U/L  TSH   Collection Time: 04/24/20 11:29 AM  Result Value Ref Range   TSH 1.624 0.350 - 4.500 uIU/mL  CK   Collection Time: 04/24/20 11:29 AM  Result Value Ref Range   Total CK 308 49 - 397 U/L  SARS Coronavirus 2 by RT PCR (hospital order, performed in [place] hospital lab) Nasopharyngeal Nasopharyngeal Swab   Collection Time: 04/24/20  2:14 PM   Specimen: Nasopharyngeal Swab  Result Value Ref Range   SARS Coronavirus 2 NEGATIVE NEGATIVE  Bilirubin, direct   Collection Time: 04/24/20  7:23 PM  Result Value Ref Range   Bilirubin, Direct 0.4 (H) 0.0 - 0.2  mg/dL  HIV Antibody (routine testing w rflx)   Collection Time: 04/24/20  7:23 PM  Result Value Ref Range   HIV Screen 4th Generation wRfx Non Reactive Non Reactive  Magnesium   Collection Time: 04/24/20  7:23 PM  Result Value Ref Range   Magnesium 2.0 1.7 - 2.4 mg/dL  Phosphorus   Collection Time: 04/24/20  7:23 PM  Result Value Ref Range   Phosphorus 2.8 2.5 - 4.6 mg/dL  APTT   Collection Time: 04/24/20  7:23 PM  Result Value Ref Range   aPTT 26 24 - 36 seconds  Protime-INR   Collection Time: 04/24/20  7:23 PM  Result Value Ref Range   Prothrombin Time 13.7 11.4 - 15.2 seconds   INR 1.1 0.8 - 1.2  Reticulocytes   Collection Time: 04/24/20  7:23 PM  Result Value Ref Range   Retic Ct Pct 1.5 0.4 - 3.1 %   RBC. 4.82 4.22 - 5.81 MIL/uL   Retic Count, Absolute 72.8 19.0 - 186.0 K/uL   Immature Retic Fract 16.5 (H) 2.3 - 15.9 %  Lactate dehydrogenase   Collection Time: 04/24/20  7:23 PM  Result Value Ref Range   LDH 656 (H) 98 - 192 U/L  Haptoglobin   Collection Time: 04/24/20  7:23 PM  Result Value Ref Range   Haptoglobin <10 (L) 23 - 355 mg/dL  Save Smear   Collection Time: 04/24/20  7:23 PM  Result Value Ref Range   Smear Review SMEAR STAINED AND AVAILABLE FOR REVIEW   CBC   Collection Time: 04/24/20  7:23 PM  Result Value Ref Range  WBC 7.7 4.0 - 10.5 K/uL   RBC 4.91 4.22 - 5.81 MIL/uL   Hemoglobin 13.9 13.0 - 17.0 g/dL   HCT 44.2 39.0 - 52.0 %   MCV 90.0 80.0 - 100.0 fL   MCH 28.3 26.0 - 34.0 pg   MCHC 31.4 30.0 - 36.0 g/dL   RDW 13.9 11.5 - 15.5 %   Platelets 207 150 - 400 K/uL   nRBC 0.0 0.0 - 0.2 %  Type and screen [place]   Collection Time: 04/24/20  7:23 PM  Result Value Ref Range   ABO/RH(D) AB POS    Antibody Screen NEG    Sample Expiration 04/27/2020,2359    Antibody Identification ANTI A1    PT AG Type      NEGATIVE FOR A1 ANTIGEN Performed at [facility], [place] [address], [place], [place] [address]   Direct  antiglobulin test (not at [facility])   Collection Time: 04/24/20  7:32 PM  Result Value Ref Range   DAT, complement NEG    DAT, IgG      POS Performed at [facility], [place] [address], [place], [place] [address]   Comprehensive metabolic panel   Collection Time: 04/25/20  8:22 AM  Result Value Ref Range   Sodium 136 135 - 145 mmol/L   Potassium 4.9 3.5 - 5.1 mmol/L   Chloride 104 98 - 111 mmol/L   CO2 23 22 - 32 mmol/L   Glucose, Bld 95 70 - 99 mg/dL   BUN 13 6 - 20 mg/dL   Creatinine, Ser 1.36 (H) 0.61 - 1.24 mg/dL   Calcium 8.9 8.9 - 10.3 mg/dL   Total Protein 7.9 6.5 - 8.1 g/dL   Albumin 3.0 (L) 3.5 - 5.0 g/dL   AST 32 15 - 41 U/L   ALT 30 0 - 44 U/L   Alkaline Phosphatase 58 38 - 126 U/L   Total Bilirubin 2.1 (H) 0.3 - 1.2 mg/dL   GFR calc non Af Amer >60 >60 mL/min   GFR calc Af Amer >60 >60 mL/min   Anion gap 9 5 - 15  CBC WITH DIFFERENTIAL   Collection Time: 04/25/20  8:22 AM  Result Value Ref Range   WBC 7.8 4.0 - 10.5 K/uL   RBC 4.70 4.22 - 5.81 MIL/uL   Hemoglobin 13.7 13.0 - 17.0 g/dL   HCT 42.3 39.0 - 52.0 %   MCV 90.0 80.0 - 100.0 fL   MCH 29.1 26.0 - 34.0 pg   MCHC 32.4 30.0 - 36.0 g/dL   RDW 14.1 11.5 - 15.5 %   Platelets 205 150 - 400 K/uL   nRBC 0.3 (H) 0.0 - 0.2 %   Neutrophils Relative % 51 %   Neutro Abs 4.0 1.7 - 7.7 K/uL   Lymphocytes Relative 26 %   Lymphs Abs 2.0 0.7 - 4.0 K/uL   Monocytes Relative 11 %   Monocytes Absolute 0.8 0.1 - 1.0 K/uL   Eosinophils Relative 10 %   Eosinophils Absolute 0.8 (H) 0.0 - 0.5 K/uL   Basophils Relative 1 %   Basophils Absolute 0.1 0.0 - 0.1 K/uL   Immature Granulocytes 1 %   Abs Immature Granulocytes 0.05 0.00 - 0.07 K/uL  Lactate dehydrogenase   Collection Time: 04/25/20  8:22 AM  Result Value Ref Range   LDH 770 (H) 98 - 192 U/L  CBC   Collection Time: 04/26/20  7:13 AM  Result Value Ref Range  WBC 9.5 4.0 - 10.5 K/uL   RBC 4.61 4.22 - 5.81 MIL/uL   Hemoglobin 13.4 13.0 - 17.0 g/dL   HCT 42.0  39.0 - 52.0 %   MCV 91.1 80.0 - 100.0 fL   MCH 29.1 26.0 - 34.0 pg   MCHC 31.9 30.0 - 36.0 g/dL   RDW 15.8 (H) 11.5 - 15.5 %   Platelets 211 150 - 400 K/uL   nRBC 0.3 (H) 0.0 - 0.2 %  Lactate dehydrogenase   Collection Time: 04/26/20  7:13 AM  Result Value Ref Range   LDH 734 (H) 98 - 192 U/L  Reticulocytes   Collection Time: 04/26/20  7:13 AM  Result Value Ref Range   Retic Ct Pct 1.8 0.4 - 3.1 %   RBC. 4.52 4.22 - 5.81 MIL/uL   Retic Count, Absolute 81.4 19.0 - 186.0 K/uL   Immature Retic Fract 24.0 (H) 2.3 - 15.9 %   No results found.   Discharge Instructions: Discharge Instructions    Activity as tolerated - No restrictions   Complete by: As directed    Diet general   Complete by: As directed       Signed: Al Decant, MD 04/26/2020, 9:22 AM   Pager: [phone]

## 2020-04-25 NOTE — Progress Notes (Signed)
   Brief History:  Andrew [name] is a 49 year old male with a past medical history significant for seizures and bipolar disorder, who presented to the emergency department with hypertension and hematuria.  Subjective:  Andrew [name] was evaluated and examined at bedside this morning. Patient reports that he is feeling fine and no longer endorses a headache. He also denies any fever, chills, dizziness, changes in vision, chest pain, back pain, or dysuria.  Objective:  Vital signs in last 24 hours: Vitals:   04/24/20 2112 04/24/20 2322 04/25/20 0346 04/25/20 0405  BP: (!) 171/103 (!) 160/99  (!) 136/91  Pulse: 72 75  84  Resp:  18  17  Temp:  98.8 F (37.1 C)  98.6 F (37 C)  TempSrc:  Oral  Oral  SpO2: 99% 100%  98%  Weight:   109 kg   Height:       Weight change:   Intake/Output Summary (Last 24 hours) at 04/25/2020 0804 Last data filed at 04/25/2020 0405 Gross per 24 hour  Intake 1244.02 ml  Output 1825 ml  Net -580.98 ml   Physical Exam  Constitutional: He is oriented to person, place, and time. No distress.  HENT:  Head: Normocephalic and atraumatic.  Eyes: EOM are normal. No scleral icterus.  Cardiovascular: Normal rate, regular rhythm and normal heart sounds. Exam reveals no gallop and no friction rub.  No murmur heard. Pulmonary/Chest: Effort normal and breath sounds normal. No respiratory distress. He has no wheezes. He has no rales.  Abdominal: Soft. Bowel sounds are normal. He exhibits no distension. There is no abdominal tenderness.  Musculoskeletal:        General: No edema. Normal range of motion.  Neurological: He is alert and oriented to person, place, and time.  Skin: Skin is warm and dry. No rash noted. He is not diaphoretic.  Psychiatric: Mood and affect normal.     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  # Hypertension. # Hematuria. #Drug-induced Hemolysis. Patient's admitted for hypertension and hematuria concerning for hemolysis. Hemoglobin 13.9 this morning down from 14.7 on admission. Total bilirubin elevated at 2.6, indirect bilirubin 1.2. LDH elevated at 656. Direct antiglobulin test was positive for IgG. Blood smear interpretation pending. Blood pressure 178/105 this morning. Assessment: The etiology of this acute hypertension and hemolysis is most likely IVIG infusions. 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 Hemoglobin remained within normal limits, but will need to continue to monitor in the setting of suspected hemolysis. Plan: - IV labetalol 5 mg. - Continue IV labetalol 5 mg PRN for SBP >180. - Continue to monitor BP. - Consult Hematology. Their recommendations are appreciated.  # Seizures. # Gait Abnormality. Patient has history of seizures since he was 49 years old. Since 2019, he began experiencing right hand weakness and gait disturbances. Is being followed outpatient by Dr. Krista Blue. Patient denies any seizures within the past week. Plan: - Continue home ER Lamotrigine 300 mg: 2 tablets at nighttime. - Continue home Vimpat 200 mg: 2 tablets twice a day. - Continue home Buspirone 15 mg: three times daily.  Code status: Full code Diet: Full: regular. VTE prophylaxis: Subcutaneous Heparin.    LOS: 1 day   Nanetta Batty, Medical Student 04/25/2020, 8:04 AM

## 2020-04-25 NOTE — Progress Notes (Signed)
  Date: 04/25/2020  Patient name: Andrew [name]  Medical record number: [id number]  Date of birth: 03/03/1971   I have seen and evaluated Andrew [name] and discussed their care with the Residency Team.  In brief, patient is a 49 year old male with a past medical history significant for seizures and mild intellectual disability who presented to the ED with hypertension hematuria over the last day.  History obtained from patient and chart.  Patient was recently started on IVIG infusions by his neurologist for progressive motor abnormalities including a progressively worsening gait and right-sided weakness.  Patient initially noted dark red urine yesterday morning as he was receiving his third dose of IVIG infusion.  Patient's home health nurse also noted that his blood pressure was elevated with SBP's in the 200s and he was referred to the ED for further evaluation.  In the emergency room he was noted to have a blood pressure of 241/176.  Patient complained of some associated headaches but no change in his focal weakness.  No chest pain, no shortness of breath, no palpitations, no diaphoresis, no lightheadedness, no syncope, no tingling or numbness, no nausea or vomiting, no abdominal pain, no diarrhea.  Today patient states that he feels well but is noted to have persistent dark red urine.  PMHx, Fam Hx, and/or Soc Hx : As per resident admit note  Vitals:   04/25/20 0405 04/25/20 0842  BP: (!) 136/91 (!) 178/105  Pulse: 84 75  Resp: 17 18  Temp: 98.6 F (37 C) 98.6 F (37 C)  SpO2: 98% 97%   General: Awake, alert oriented x3, NAD CVS: Regular rhythm, normal heart sounds Lungs: CTA bilaterally Abdomen: Soft, nontender, nondistended, normoactive bowel sounds Extremities: No edema noted, nontender to palpation Psych: Normal mood and affect HEENT: Normocephalic, atraumatic Skin: Warm and dry, no rash noted  Assessment and Plan: I have seen and evaluated the patient as outlined above. I  agree with the formulated Assessment and Plan as detailed in the residents' note, with the following changes:   1.  Hypertensive urgency and hematuria in the setting of IVIG infusions: -Patient presented to the ED with hematuria and elevated blood pressures with SBP's in the 200s in the setting of IVIG infusions at home.  The likely etiology for his hypertension as well as hematuria is his IVIG infusions.  Patient likely has drug-induced hemolysis as a reaction to his IVIG. -No further IVIG infusions at this time -Patient blood pressures have improved with SBP's in the 170s this morning.  We will continue with as needed labetalol for now.  Patient has no history of hypertension at home and I suspect that his blood pressure will slowly improve -Patient's LDH remains elevated, indirect bilirubin is mildly elevated as well and he has persistent hematuria consistent with hemolysis.  Patient's hemoglobin has decreased by 1 since admission. 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  We will continue monitor closely.  Aldine Contes, MD 6/4/202111:39 AM

## 2020-04-25 NOTE — Plan of Care (Cosign Needed)
Spoke with hematology/oncology, Dr. Irene Limbo, about IVIG-induced hemolysis. Dr. Irene Limbo recommended to continue to mitigate hypertension, monitor CBC, and obtain a peripheral blood smear. Hemoglobin has remained within normal limits and has not significantly dropped since admission. Patient has remained asymptomatic. Not overtly concerned of severe hemolysis and thinks it will be self-limiting. We will continue to monitor BP, obtain routine CBC, and LDH to monitor the progression of suspected drug-induced hemolysis. Will consult hematology if patient worsens.  Andrew [name], Careers information officer

## 2020-04-25 NOTE — TOC Initial Note (Signed)
Transition of Care [facility]) - Initial/Assessment Note    Patient Details  Name: Andrew [name] MRN: [id number] Date of Birth: [birth year]  Transition of Care [facility]) CM/SW Contact:    Andrew Crews, RN Phone Number: [phone] 04/25/2020, 10:43 AM  Clinical Narrative:                  Spoke with patient at the bedside. PTA home with mom. Stated he had been having Andrew [name] come out to do his IVIG at home - today was supposed to be his last day. States that his mom is at home with him but he also has community and church support. He reports no problems with transportation either at discharge or for follow up medical appointments. TOC team following for transition needs.   Expected Discharge Plan: Home/Self Care Barriers to Discharge: Continued Medical Work up   Patient Goals and CMS Choice   CMS Medicare.gov Compare Post Acute Care list provided to:: Patient Choice offered to / list presented to : Patient  Expected Discharge Plan and Services Expected Discharge Plan: Home/Self Care In-house Referral: Clinical Social Work Discharge Planning Services: CM Consult                                          Prior Living Arrangements/Services   Lives with:: Self, Parents Patient language and need for interpreter reviewed:: Yes        Need for Family Participation in Patient Care: Yes (Comment) Care giver support system in place?: Yes (comment) Current home services: DME(RW) Criminal Activity/Legal Involvement Pertinent to Current Situation/Hospitalization: No - Comment as needed  Activities of Daily Living   ADL Screening (condition at time of admission) Patient's cognitive ability adequate to safely complete daily activities?: Yes Is the patient deaf or have difficulty hearing?: No Does the patient have difficulty seeing, even when wearing glasses/contacts?: No Does the patient have difficulty concentrating, remembering, or making decisions?: No Patient able to express need for  assistance with ADLs?: Yes Does the patient have difficulty dressing or bathing?: No Independently performs ADLs?: Yes (appropriate for developmental age) Does the patient have difficulty walking or climbing stairs?: No Weakness of Legs: Right Weakness of Arms/Hands: Right  Permission Sought/Granted      Share Information with NAME: Andrew [name]     Permission granted to share info w Relationship: mom  Permission granted to share info w Contact Information: [phone]  Emotional Assessment Appearance:: Appears stated age Attitude/Demeanor/Rapport: Engaged Affect (typically observed): Accepting Orientation: : Oriented to Self, Oriented to Place, Oriented to  Time, Oriented to Situation Alcohol / Substance Use: Not Applicable Psych Involvement: No (comment)  Admission diagnosis:  Hyperbilirubinemia [E80.6] 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 Neuropathy 02/18/2020  . Weakness 07/12/2019  . Cervical stenosis of spinal canal 10/04/2018  . Gait abnormality 10/04/2018  . Partial symptomatic epilepsy with complex partial seizures, not intractable, without status epilepticus ([place]) 12/24/2016  . Bipolar disorder ([place]) 12/24/2016  . Seizures ([place])   . Anxiety    PCP:  Andrew [name], [place] Pharmacy:   [facility] [place], [address] [place] [address] Phone: [phone] Fax: [phone]     Social  Determinants of Health (SDOH) Interventions    Readmission Risk Interventions No flowsheet data found.

## 2020-04-26 LAB — RETICULOCYTES
Immature Retic Fract: 24 % — ABNORMAL HIGH (ref 2.3–15.9)
RBC.: 4.52 MIL/uL (ref 4.22–5.81)
Retic Count, Absolute: 81.4 10*3/uL (ref 19.0–186.0)
Retic Ct Pct: 1.8 % (ref 0.4–3.1)

## 2020-04-26 LAB — CBC
HCT: 42 % (ref 39.0–52.0)
Hemoglobin: 13.4 g/dL (ref 13.0–17.0)
MCH: 29.1 pg (ref 26.0–34.0)
MCHC: 31.9 g/dL (ref 30.0–36.0)
MCV: 91.1 fL (ref 80.0–100.0)
Platelets: 211 10*3/uL (ref 150–400)
RBC: 4.61 MIL/uL (ref 4.22–5.81)
RDW: 15.8 % — ABNORMAL HIGH (ref 11.5–15.5)
WBC: 9.5 10*3/uL (ref 4.0–10.5)
nRBC: 0.3 % — ABNORMAL HIGH (ref 0.0–0.2)

## 2020-04-26 LAB — PATHOLOGIST SMEAR REVIEW

## 2020-04-26 LAB — LACTATE DEHYDROGENASE: LDH: 734 U/L — ABNORMAL HIGH (ref 98–192)

## 2020-04-26 LAB — HAPTOGLOBIN: Haptoglobin: <10 mg/dL — ABNORMAL LOW (ref 23–355)

## 2020-04-26 MED ORDER — FOLIC ACID 1 MG PO TABS: 1.0000 mg | ORAL_TABLET | Freq: Every day | ORAL | 3 refills | Status: DC

## 2020-04-26 NOTE — Progress Notes (Signed)
Brief History:  Andrew [name] is a 49 year old male with a past medical history significant for seizures and bipolar disorder, who presented to the emergency department with hypertension and hematuria.  Subjective:  Mr. Kerner was evaluated and examined at bedside this morning. Patient states that he is feeling better today. He denies any fever, chills, dizziness, changes in vision, or chest pain.  Objective:  Vital signs in last 24 hours: Vitals:   04/25/20 2100 04/26/20 0005 04/26/20 0218 04/26/20 0420  BP: (!) 168/99 (!) 147/101  (!) 139/98  Pulse: 84 78  89  Resp: 19 16  20   Temp: 98.5 F (36.9 C) 99.2 F (37.3 C)  98.1 F (36.7 C)  TempSrc: Oral Oral  Oral  SpO2: 100% 100%  100%  Weight:   110.3 kg   Height:       Weight change: -0.831 kg  Intake/Output Summary (Last 24 hours) at 04/26/2020 0746 Last data filed at 04/26/2020 0200 Gross per 24 hour  Intake 698.3 ml  Output 2250 ml  Net -1551.7 ml   Physical Exam  Constitutional: He is oriented to person, place, and time. No distress.  HENT:  Head: Normocephalic and atraumatic.  Eyes: EOM are normal. No scleral icterus.  Cardiovascular: Normal rate, regular rhythm and normal heart sounds. Exam reveals no gallop and no friction rub.  No murmur heard. Pulmonary/Chest: Effort normal and breath sounds normal. No respiratory distress. He has no wheezes. He has no rales.  Abdominal: Soft. Bowel sounds are normal. He exhibits no distension. There is no abdominal tenderness.  Musculoskeletal:        General: No edema. Normal range of motion.  Neurological: He is alert and oriented to person, place, and time.  Skin: Skin is warm and dry. No rash noted. He is not diaphoretic.  Psychiatric: Mood and affect normal.     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  # Hypertension. # Hematuria. #Drug-induced Hemolysis. Patient's admitted for hypertension and hematuria concerning for hemolysis. Hemoglobin has remained relatively stable since admission (14.7 >> 13.9 >> 13.7). Total bilirubin elevated on admission at 2.6, has decreased to 2.1. Awaiting repeat CBC, LDH, reticulocyte count this morning to assess. Patient's urine was yellow this morning and without red pigmentation. Most recent BP was 139/98. Assessment: The etiology of this acute hypertension and hemolysis is most likely IVIG infusions. Awaiting CBC, LDH, and reticulocyte count to assess progression/regression of hemolysis. Urine is improving, patient no longer appears to have hematuria. Patient is afebrile and asymptomatic. Patient is improving well. Given patients clinical status, and improvement in urine, the goal will be to discharge this afternoon if labs are within normal limits. Plan: - Continue IV labetalol 5 mg PRN for SBP >180. - Continue to monitor BP. - Awaiting CBC, LDH, and reticulocyte count lab results. - Supplemental folic acid 1 mg. - Potential discharge home this afternoon if labs within normal limits.  # Seizures. # Gait Abnormality. Patient has history of seizures since he was 49 years old. Since 2019, he began experiencing right hand weakness and gait disturbances. Is being followed outpatient by Dr. Krista Blue. Patient denies any seizures within the past week. Plan: - Continue home ER Lamotrigine 300 mg: 2 tablets at nighttime. - Continue home  Vimpat 200 mg: 2 tablets twice a day. - Continue home Buspirone 15 mg: three times daily.  Code status: Full code Diet: Full: regular. VTE prophylaxis: Subcutaneous Heparin.    LOS: 2 days   Nanetta Batty, Medical Student 04/26/2020, 7:46 AM

## 2020-04-26 NOTE — Discharge Instructions (Signed)
You were admitted to the hospital for high blood pressure and blood in your urine likely due to the IVIG infusions you had been receiving. Those infusions were stopped and you should not receive any additional IVIG infusions. Your blood pleasure was treated with IV medications and came down. The blood in your urine has cleared. We checked your blood levels and they have remained stable. Your blood pressures are not completely back to baseline but they are safe for you to return home. You should follow up with your primary care doctor within the week and with your neurologist within the week. It was a pleasure taking care of you.

## 2020-04-30 ENCOUNTER — Telehealth: Payer: Self-pay | Admitting: *Deleted

## 2020-04-30 NOTE — Telephone Encounter (Signed)
I have spoken to the patient's mother. His appt has been moved to 05/05/20 at 12:45. They will arrive at 12:15 for check-in.

## 2020-04-30 NOTE — Telephone Encounter (Signed)
I was able to talk with [place] registered nurse Lelan Pons, who infused patient IVIG on June 2, during his second day infusion on June 3, he was noted to have significantly elevated blood pressure, was sent to emergency room, evaluation showed evidence of hemolysis, with significantly decreased haptoglobin, elevated LDH, patient's urine turned into dark color  He was seen by Verdis Frederickson again at home, is much better, vital signs were within normal limit  Literature reviewed Hemolytic anemia should be considered in patients treated with high dose IVIG who experience a drop in hemoglobin following treatment and/or who develop clinical signs of hemolysis. It is important that physicians are aware of this potential complication for early recognition as well as for disclosing this potential side effect to patients and their families. It is now our routine practice to monitor the hemoglobin 24 to 48 h after completion of IVIG and one week after discharge, particularly if retreatment is necessary. A work up for hemolysis is not routinely required except if this complication is suspected. Families should be informed to return should patient develop pallor, lethargy, dark urine, shortness of breath or palpitations. Further insight is still required in regards to the pathogenesis and predisposing factors for hemolysis in this patient population.  Please move up his appointment with Judson Roch to her next available

## 2020-04-30 NOTE — Telephone Encounter (Signed)
Review for IVIG treatment.

## 2020-05-05 ENCOUNTER — Encounter: Payer: Self-pay | Admitting: Neurology

## 2020-05-05 ENCOUNTER — Other Ambulatory Visit: Payer: Self-pay

## 2020-05-05 ENCOUNTER — Ambulatory Visit (INDEPENDENT_AMBULATORY_CARE_PROVIDER_SITE_OTHER): Payer: Medicare Other | Admitting: Neurology

## 2020-05-05 VITALS — BP 145/87 | HR 92 | Ht 70.0 in | Wt 225.0 lb

## 2020-05-05 DIAGNOSIS — R269 Unspecified abnormalities of gait and mobility: Secondary | ICD-10-CM | POA: Diagnosis not present

## 2020-05-05 DIAGNOSIS — R569 Unspecified convulsions: Secondary | ICD-10-CM

## 2020-05-05 MED ORDER — FOLIC ACID 1 MG PO TABS: 1.0000 mg | ORAL_TABLET | Freq: Every day | ORAL | 3 refills | Status: DC

## 2020-05-05 NOTE — Progress Notes (Signed)
PATIENT: Andrew [name] DOB: 1971-03-04  REASON FOR VISIT: follow up HISTORY FROM: patient  HISTORY OF PRESENT ILLNESS: Today 05/05/20  HISTORY  HISTORY OF PRESENT ILLNESS:[name], 49 year old male returns for followup. He is with his mother.He has a history of intractable seizure disorder since 49 years old. He has been followed in the office since 1990. 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  He has had EMU monitoring at [facility] in the past that included abnormal baseline EEG due to intermittent predominantly right temporal spike, sharp right,and focal slowing. He continues to refuse surgery to remove irritable focus.  Over the years, he has tried different combination, he has been on current medications  Topamax 100 mg twice a day, +50 mg twice a day, Dilantin 100 mg 2 tablets twice a day, Keppra 500 mg 3 tablets b.i.d, brand name  He is also taking Buspirone 10 mg t.i.d., citalopram 20 mg every day for his depression and angry control prescribed by psychiatry.  He continues to have 2-3 seizures every day, complex partial with secondary generalization, he had burst open few helmet due to his seizure, he continued to smoke marijuana and cigarette. He helps his mother taking care of his elderly bed ridden grandmother at home, has raging spells sometimes.   Vimpat 150 twice a day was added since 2015,   He still has seizure almost daily,  He is now taking Topamax 100 plus 50 mg twice a day , Dilantin 200 mg twice a day, Keppra 500 mg 3 tablets twice a day, Vimpat 150 mg twice a day  UPDATE February 07 2017: He came here urgently in early February 2018 because bilateral hands shaking, he was given the prescription of Vraylar, his shaking has much improved after stop the medication.  He is going through dental procedure, his mother had POA, they agreed to proceed with VNS  UPDATE June 09 2017: His seizure overall has much improved, is no longer  having a daily basis, is tolerating current medications, but yesterday on June 08 2017, he had few recurrent seizures, he is still going through dental procedures,  He is taking Keppra 500 mg 3 tablets, Vimpat 150 mg 2 tablets, topiramate 100 mg 2 tablets twice a day  UPDATE Dec 12 2017: His seizure overall has improved some, he also goes to [facility] psych[facility], taking buspiprone, which has helped his mood some,  Continue have frequent migraine headaches, 3-4 times each week, sometimes multiple episode in the day,  He smokes cigarettes some, mother has convinced him to stop use marijuana.  He continue have significant depression, after discussed with mother and patient, we decided to switch Keppra to lamotrigine,  We also talked about VNS placement, he wants to hold off   UPDATE March 20 2018: He is doing much better now with current combinations, lamotrigine 100 mg 2 tablets twice a day, Vimpat 150 mg 2 tablets twice a day, Topamax 100 mg 2 tablets twice a day  Only few recurrent seizure, much better than his previous baseline his mood has improved as well  UPDATE July 20 2018: He is doing much better now with current combination of Vimpat 150 mg 2 tablets twice a day, lamotrigine 100 mg 3 tablets twice a day, Topamax 100 mg twice a day, he only has seizure once or twice each week, they are short lasting, sometimes without recurrent seizure in 1 week, this is in contrast to previous daily multiple seizure episode,  But he complains of dizziness,  lightheadedness after each dose of medications,  His depression has much improved too, is more compliant with his mother  EEG in April 2019 showed generalized epileptiform discharge in the background of mild dysrhythmic slowing.  Update October 04, 2018 He is accompanied by his mother at today's visit, he presented to emergency room on September 24, 2018 after found falling down at home, he has no recollection of the event,  mother thought he might have a seizure, his seizure overall is under excellent control, tolerating lamotrigine XR 300 mg 2 tablets at nighttime, Vimpat 2 tablets twice a day, his mood is also under good control.  I personally reviewed MRI of brain: Advanced atrophy of the cerebellum, likely secondary to chronic antiepileptic medication MRI of cervical spine: Multilevel degenerative changes, moderate to severe foraminal narrowing at C3-4, C7-T1, secondary to multilevel uncovertebral disease, there is no evidence of canal stenosis.  Patient complains slow worsening gait abnormality over the past few months, urinary frequency, urgency, chronic low back [name], also mild neck [name] radiating [name] to right hand, arm  Virtual Visit via Phone March 14 2019: I interviewed patient and his mother, his seizure is under very good control, only had 3 seizure and few staring spells over past 4 months.  He continues to have gait abnormalities, using walker, right arm weakness, no incontinence.  EMG/NCS on Nov 10 2018: showed evidence of active right cervical radiculopathy, involving right C7,8, T1, moderate C6 myotomes.   He was seen by neurosurgeon, not a surgical candidate.   UPDATE July 12 2019: He is doing very well from a seizure standpoint with current combination lamotrigine ER 300 mg 2 tablets at bedtime, Vimpat 200 mg 2 tablets twice a day, he had minor staring spells about once a week, rarely has generalized seizures  Continue have significant right hand weakness, but not progressing, continue to have gait abnormality,  Mother reported few episode of sudden onset generalized weakness, could not walk, left arm against gravity, most recent episode was July 10, 2019, he has got up taking his morning medications, he noticed sudden onset weakness, he was able to call mother to check on it, episode lasted about few hours, gradually improved  Reviewed emergency room presentation on November 20 2018, similar episode on September 24, 2018, during the spells, he was able to move his toes and fingers, but very slowly, laboratory evaluations December showed negative troponin, normal CMP, with glucose of 99, normal CBC,  UPDATE Jan 15 2020: He is overall doing very well, there was no recurrent generalized tonic-clonic seizure, but he has staring spells, transient unresponsiveness almost daily basis, he no longer has to wear his helmet, continue have right hand weakness, gait difficulty has improved  UPDATE February 18 2020: He was seen by neurosurgeon Dr. Kathyrn Sheriff, deemed to be a good candidate for vagal nerve stimulation, however, patient have a tendency to back-and-forth with this decision, all VNS related question was answered, despite polypharmacy large dose of Vimpat 400 mg twice a day, lamotrigine 600 mg daily, he continues to have frequent spells of "small seizures", staring into space, unresponsive for 1 minutes,  this is a significant compared to previous frequent drop attacks, he has to wear helmet in the past,  He continue have significant right more than left hand weakness, gait abnormality, he denies significant sensory changes  He came in today for repeat electrodiagnostic study today, which showed 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  I will refer him to fluoroscopy guided lumbar puncture  I again personally reviewed MRIs in 2019, MRI of cervical spine/brain: No acute abnormality, advanced atrophy of cerebellum likely secondary to chronic antielliptic medication therapy, multilevel cervical degenerative changes, evidence of significant canal stenosis, moderate to  severe at bilateral C4-5, C5-6, C6-7, C7-T1 MRI of lumbar spine showed mild degenerative changes, no significant canal or foraminal narrowing.  Update May 05, 2020 SS: Laboratory evaluation (multiple myeloma panel, ANA, copper, IgG autoantibodies) showed no significant abnormalities, was sent for LP-normal CSF. MRI cervical spine showed multilevel degenerative changes, no evidence of spinal cord compression, variable degree of foraminal narrowing.  EMG nerve conduction study showed features suggestive of multifocal motor neuropathy, started on IVIG.  During his third day of IVIG, had significant elevated blood pressure 221/145, dark urine, CBC showed no significant abnormality, CMP showed low sodium 133, total bilirubin was significantly elevated 2.6, liver function was normal, was admitted for hypertensive treatment.  He was getting Gammagard 10%, received full infusion on 6/1, 6/2, received about 40 mL 6/3, then was stopped due to elevated BP.  Each infusion day he was medicated with Tylenol 650 mg, Benadryl 25 mg.  In the hospital, hematology recommended starting folic acid given AED use.  Here today with his mom, was discharged from hospital June 5th. Since then, 3 seizure events, which is unusual for him. 1st-mom heard him in his room, tense all over , making vocal noises; 2nd came into mom's room asking why someone stole his underwear, was having hallucinations; 3rd mom heard him fall in the bathroom hit his chin on the counter. The IVIG was helpful for balance. Has been checking BP at home staying around 140/80's.  Waiting for appointment to see primary doctor, urine is back to normal. Was working with Dr. Kathyrn Sheriff for VNS, patient wants to proceed but put on hold, wanting to do 1 thing at a time.  He remains on Vimpat 200 mg, 2 tablets twice a day, lamotrigine XR 300 mg, 2 tablets at bedtime.  REVIEW OF SYSTEMS: Out of a complete 14 system review of symptoms, the patient complains only of the  following symptoms, and all other reviewed systems are negative.  Seizure, walking difficulty  ALLERGIES: No Known Allergies  HOME MEDICATIONS: Outpatient Medications Prior to Visit  Medication Sig Dispense Refill  . busPIRone (BUSPAR) 15 MG tablet Take 15 mg by mouth 3 (three) times daily.     . fluticasone (FLONASE) 50 MCG/ACT nasal spray Place 1 spray into both nostrils daily as needed for allergies or rhinitis.    Marland Kitchen lacosamide (VIMPAT) 200 MG TABS tablet Take 2 tablets (400 mg total) by mouth 2 (two) times daily. 360 tablet 4  . LamoTRIgine 300 MG TB24 24 hour tablet Take 2 tablets (600 mg total) by mouth at bedtime. 180 tablet 4  . Zinc 100 MG TABS Take 100 mg by mouth daily.    . folic acid (FOLVITE) 1 MG tablet Take 1 tablet (1 mg total) by mouth daily. 30 tablet 3   No facility-administered medications prior to visit.    PAST MEDICAL HISTORY: Past Medical History:  Diagnosis Date  . Anxiety   . Depression   . Seizures ([place])     PAST SURGICAL HISTORY: Past Surgical History:  Procedure Laterality Date  .  none      FAMILY HISTORY: Family History  Problem Relation Age of Onset  . Asthma Father     SOCIAL HISTORY: Social History   Socioeconomic History  . Marital status: Single    Spouse name: Not on file  . Number of children: 0  . Years of education: 69  . Highest education level: Not on file  Occupational History    Comment: Disabled  Tobacco Use  . Smoking status: Current Every Day Smoker    Packs/day: 0.50    Years: 20.00    Pack years: 10.00    Types: Cigarettes  . Smokeless tobacco: Never Used  Vaping Use  . Vaping Use: Never used  Substance and Sexual Activity  . Alcohol use: No    Alcohol/week: 0.0 standard drinks  . Drug use: No    Comment: 11/08/16 - reports he has stopped smoking marijuana.  . Sexual activity: Not on file  Other Topics Concern  . Not on file  Social History Narrative   Patient lives at home with his mother Andrew [name] ). Patient is disabled. Patient has high school education.   Left handed.   Caffeine- soda - pepsi four cans daily.   Patients father died of over dose.-40   Social Determinants of Health   Financial Resource Strain:   . Difficulty of Paying Living Expenses:   Food Insecurity:   . Worried About Charity fundraiser in the Last Year:   . Arboriculturist in the Last Year:   Transportation Needs:   . Film/video editor (Medical):   Marland Kitchen Lack of Transportation (Non-Medical):   Physical Activity:   . Days of Exercise per Week:   . Minutes of Exercise per Session:   Stress:   . Feeling of Stress :   Social Connections:   . Frequency of Communication with Friends and Family:   . Frequency of Social Gatherings with Friends and Family:   . Attends Religious Services:   . Active Member of Clubs or Organizations:   . Attends Archivist Meetings:   Marland Kitchen Marital Status:   Intimate Partner Violence:   . Fear of Current or Ex-Partner:   . Emotionally Abused:   Marland Kitchen Physically Abused:   . Sexually Abused:    PHYSICAL EXAM  Vitals:   05/05/20 1221  BP: (!) 145/87  Pulse: 92  Weight: 225 lb (102.1 kg)  Height: _0  (1.778 m)   Body mass index is 32.28 kg/m.  Generalized: Well developed, in no acute distress   Neurological examination  Mentation: Alert oriented to time, place, history taking. Follows all commands speech and language fluent Cranial nerve II-XII: Pupils were equal round reactive to light. Extraocular movements were full, visual field were full on confrontational test. Facial sensation and strength were normal.  Head turning and shoulder shrug  were normal and symmetric. Motor: Right hand intrinsic muscle atrophy, mild right shoulder abduction weakness, mild right hand grip weakness Sensory: Sensory testing is intact to soft touch on all 4 extremities. No evidence of extinction is noted.  Coordination: Cerebellar testing reveals good finger-nose-finger and  heel-to-shin bilaterally.  Gait and station: Has to push off from seated position to stand, gait is wide-based, cautious Reflexes: Deep tendon reflexes were absent bilaterally  DIAGNOSTIC DATA (LABS, IMAGING, TESTING) - I reviewed patient records, labs, notes, testing and imaging myself where available.  Lab Results  Component Value Date   WBC 9.5 04/26/2020   HGB 13.4  04/26/2020   HCT 42.0 04/26/2020   MCV 91.1 04/26/2020   PLT 211 04/26/2020      Component Value Date/Time   NA 136 04/25/2020 0822   NA 137 07/12/2019 1017   K 4.9 04/25/2020 0822   CL 104 04/25/2020 0822   CO2 23 04/25/2020 0822   GLUCOSE 95 04/25/2020 0822   BUN 13 04/25/2020 0822   BUN 10 07/12/2019 1017   CREATININE 1.36 (H) 04/25/2020 0822   CALCIUM 8.9 04/25/2020 0822   PROT 7.9 04/25/2020 0822   PROT 7.2 02/19/2020 0913   ALBUMIN 3.0 (L) 04/25/2020 0822   ALBUMIN 4.5 07/12/2019 1017   AST 32 04/25/2020 0822   ALT 30 04/25/2020 0822   ALKPHOS 58 04/25/2020 0822   BILITOT 2.1 (H) 04/25/2020 0822   BILITOT 0.4 07/12/2019 1017   GFRNONAA >60 04/25/2020 0822   GFRAA >60 04/25/2020 0822   No results found for: CHOL, HDL, LDLCALC, LDLDIRECT, TRIG, CHOLHDL Lab Results  Component Value Date   HGBA1C 5.1 07/12/2019   Lab Results  Component Value Date   VITAMINB12 310 07/12/2019   Lab Results  Component Value Date   TSH 1.624 04/24/2020      ASSESSMENT AND PLAN 49 y.o. year old male  has a past medical history of Anxiety, Depression, and Seizures ([place]). here with:  1.  Slow worsening gait abnormality -Received initial IVIG 6/1, 6/2, on 6/3 infusion was stopped due to high blood pressure, dark urine, was sent to the ER for hypertensive emergency, hematuria, drug-induced hemolysis -Indicates improvement with balance -I saw the patient with Dr. 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 Krista Blue in 2 months or sooner if needed  2.  Intractable epilepsy -Recent seizure spells following recovery from IVIG reaction -Continue lamotrigine XR 300 mg, 2 tablets at bedtime -Continue Vimpat 200 mg, 2 tablets twice a day -Continue plan with Dr. Kathyrn Sheriff for VNS placement   I spent 30 minutes of face-to-face and non-face-to-face time with patient.  This included previsit chart review, lab review, study review, order entry, electronic health record documentation, patient education.  Butler Denmark, AGNP-C, DNP 05/05/2020, 4:18 PM [facility] [address] [place], [place] [address] [phone]

## 2020-05-05 NOTE — Patient Instructions (Addendum)
Stop IVIG at this point  Continue current medications Follow-up with Dr. Kathyrn Sheriff about VNS See your primary doctor, keep record of BP readings at home See you back

## 2020-06-05 DIAGNOSIS — F419 Anxiety disorder, unspecified: Secondary | ICD-10-CM | POA: Diagnosis not present

## 2020-07-14 ENCOUNTER — Ambulatory Visit: Payer: Medicare Other | Admitting: Neurology

## 2020-07-15 ENCOUNTER — Ambulatory Visit: Payer: Medicare Other | Admitting: Neurology

## 2020-07-16 ENCOUNTER — Telehealth: Payer: Self-pay

## 2020-07-16 DIAGNOSIS — I10 Essential (primary) hypertension: Secondary | ICD-10-CM | POA: Diagnosis not present

## 2020-07-16 NOTE — Telephone Encounter (Signed)
I submitted PA request for Vimpat 200mg  on [facility], Key: [id number], using the [employer] Electronic PA Form and received this response:  "The patient currently has access to the requested medication and a Prior Authorization is not needed for the patient/medication."

## 2020-07-31 ENCOUNTER — Telehealth: Payer: Self-pay | Admitting: Neurology

## 2020-07-31 ENCOUNTER — Other Ambulatory Visit: Payer: Self-pay | Admitting: Neurology

## 2020-07-31 NOTE — Telephone Encounter (Signed)
Pt's mother, Ilan Kahrs request refill lacosamide (VIMPAT) 200 MG TABS tablet at [place]. Pt has one day of medication left.

## 2020-07-31 NOTE — Telephone Encounter (Signed)
I called wife and relayed that Dr. Krista Blue did place prescriptin to [facility] and di go thru, should be able to pick up/.

## 2020-08-03 IMAGING — CR DG SHOULDER 2+V*L*
2 series · 2 of 2 positions shown · non-contrast
Comparison: None.

CLINICAL DATA: Fall.  History of seizure.

EXAM:
LEFT SHOULDER - 2+ VIEW

[shoulder grashey]
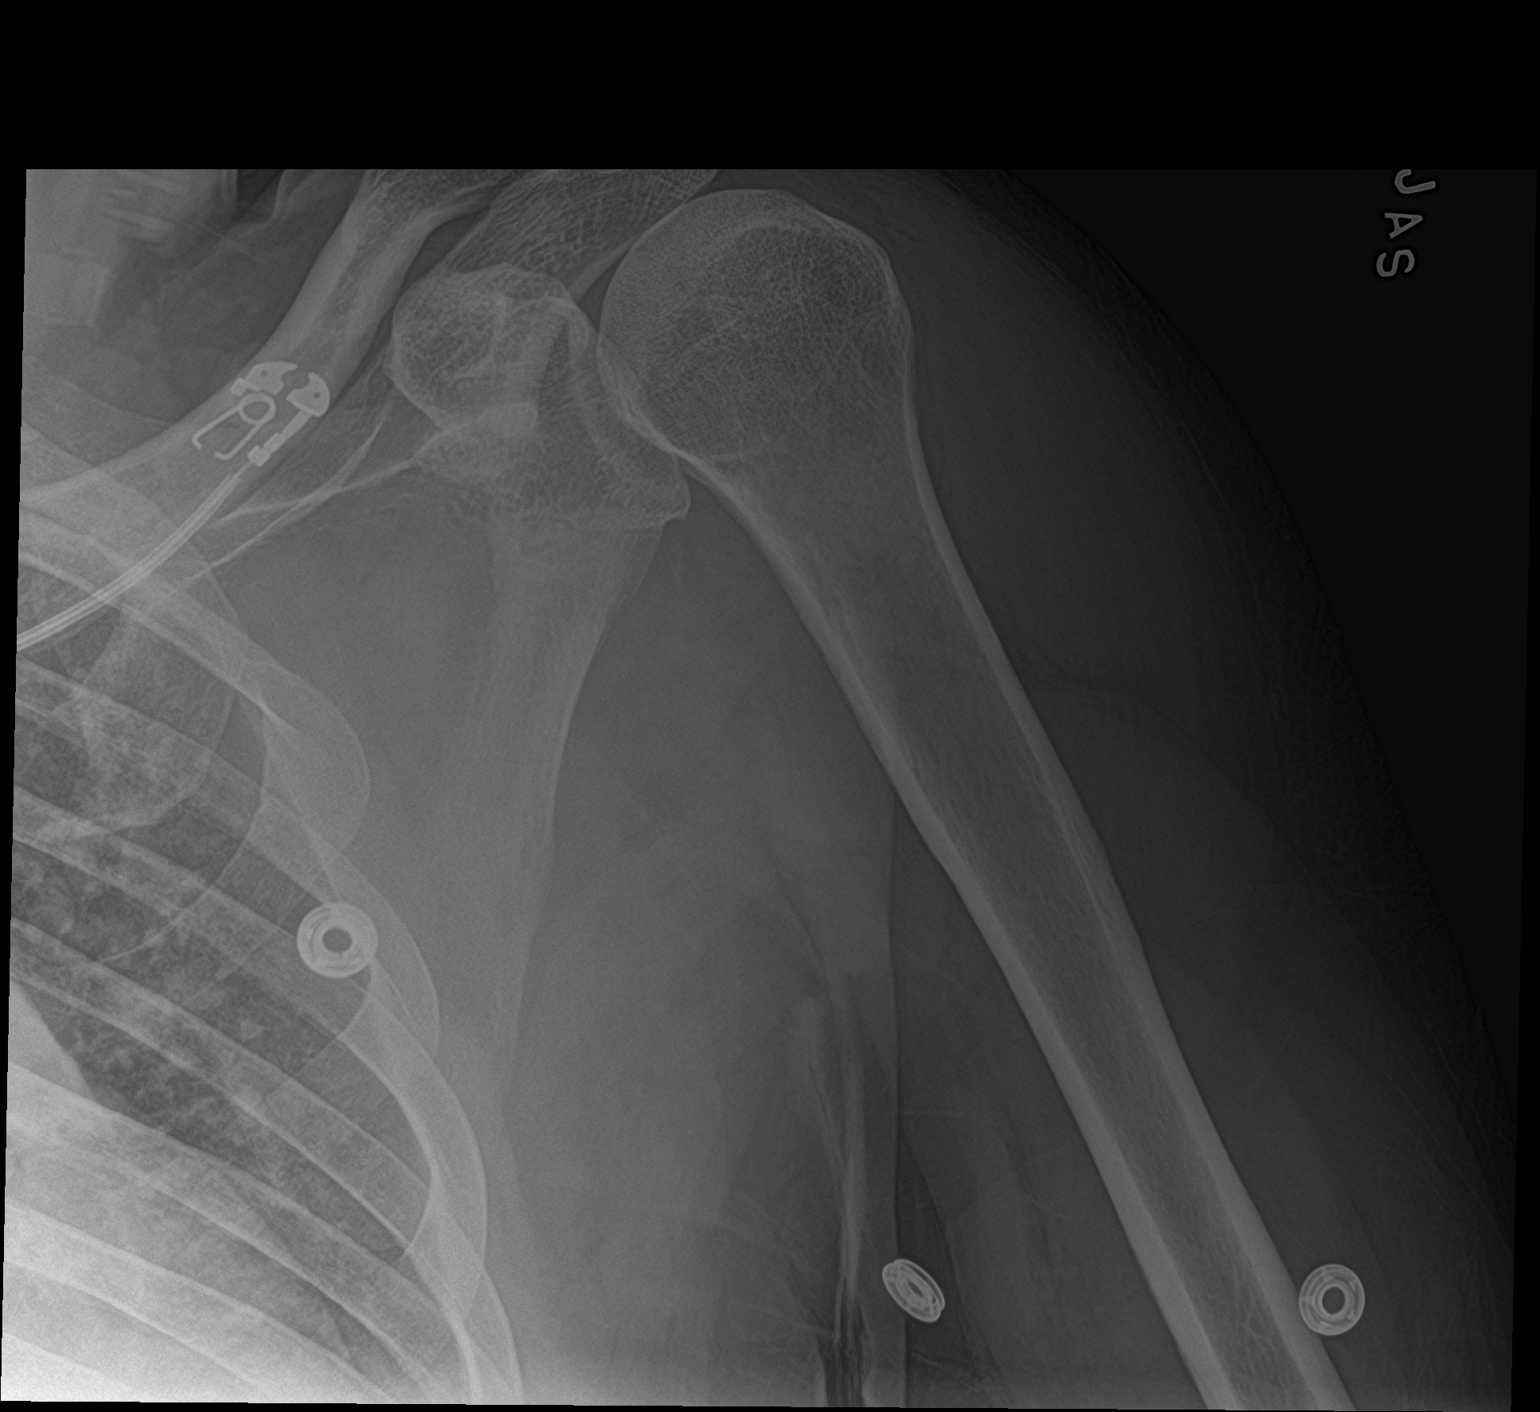

[shoulder y view]
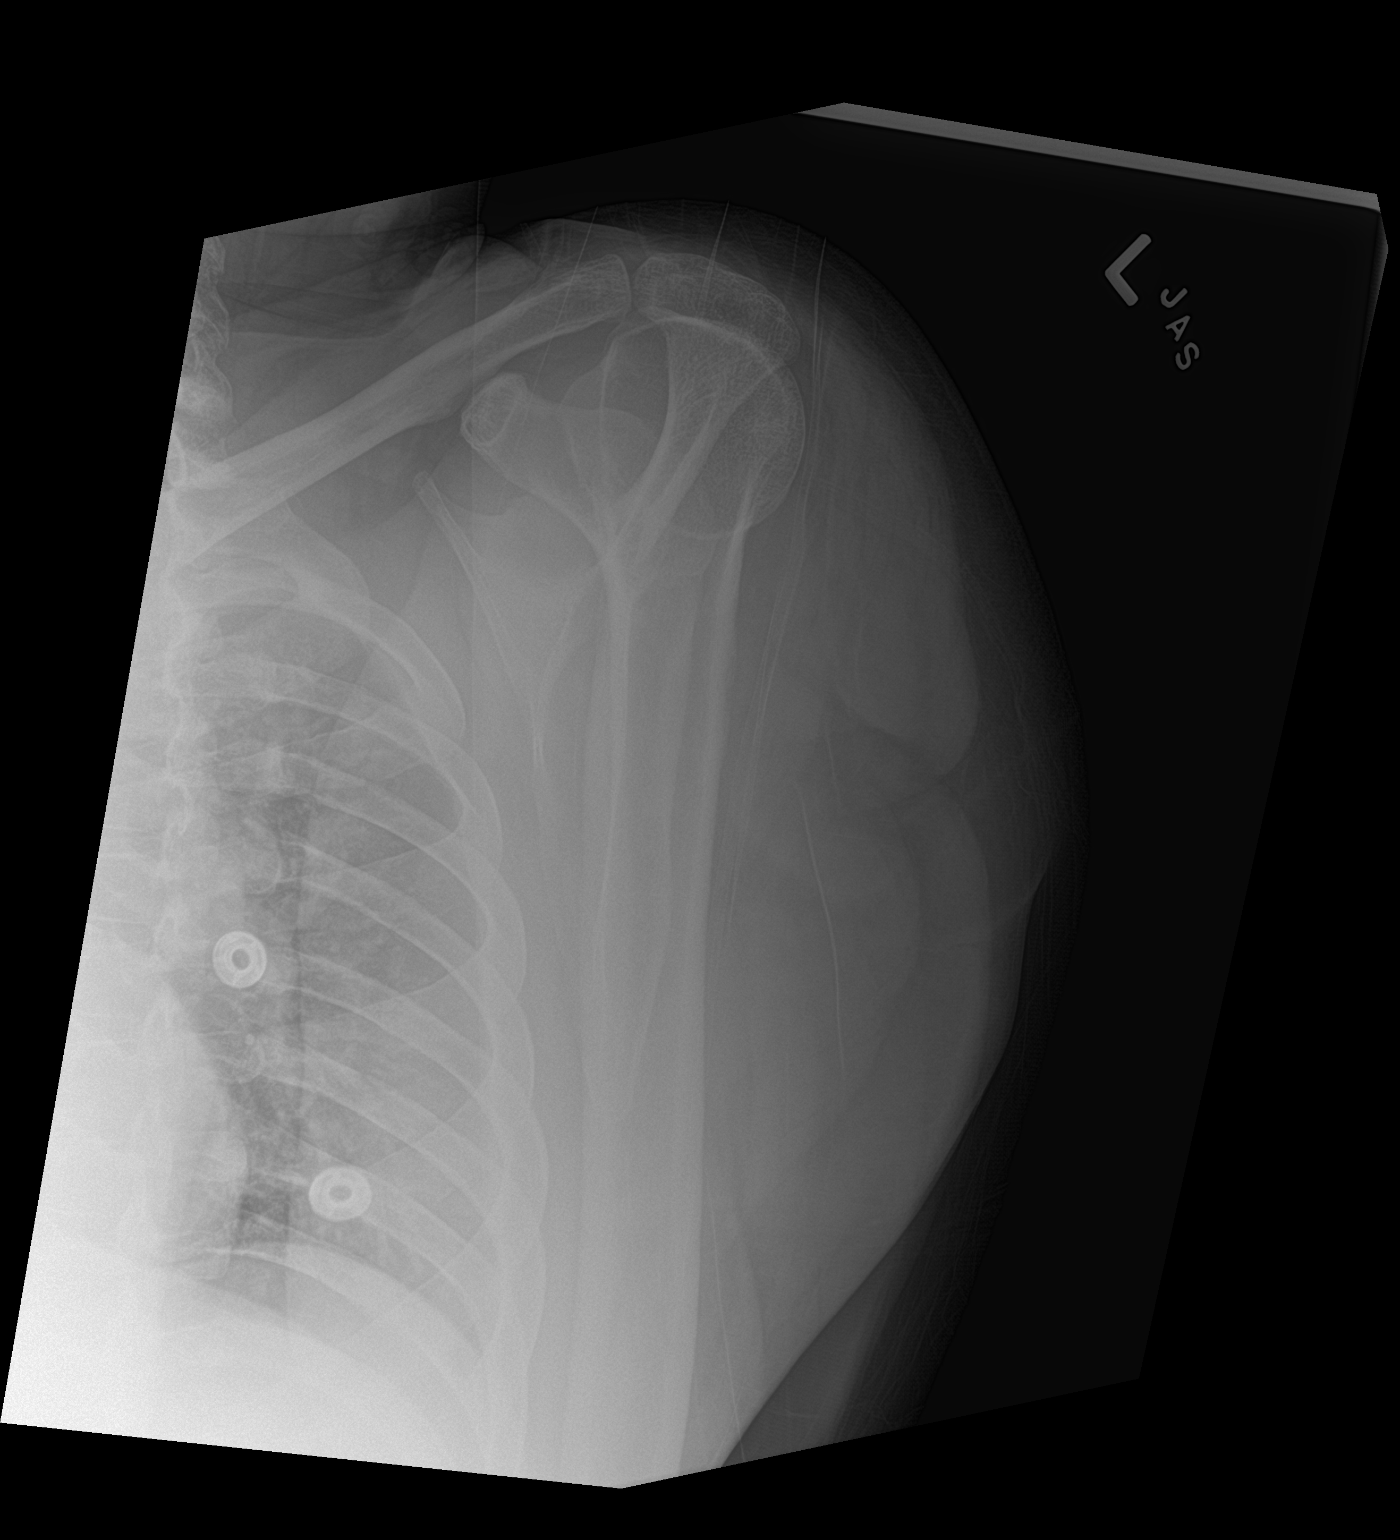

[2 of 2 positions shown; findings below may reference images not displayed]

FINDINGS: There is no evidence of fracture or dislocation. There is no
evidence of arthropathy or other focal bone abnormality. Soft
tissues are unremarkable.
IMPRESSION: Negative.

## 2020-08-03 IMAGING — CR DG CHEST 1V
1 series · 1 of 1 positions shown · non-contrast
Comparison: Chest radiograph dated 10/03/2013

CLINICAL DATA: 47-year-old male with fall.

EXAM:
CHEST  1 VIEW

[chest ap]
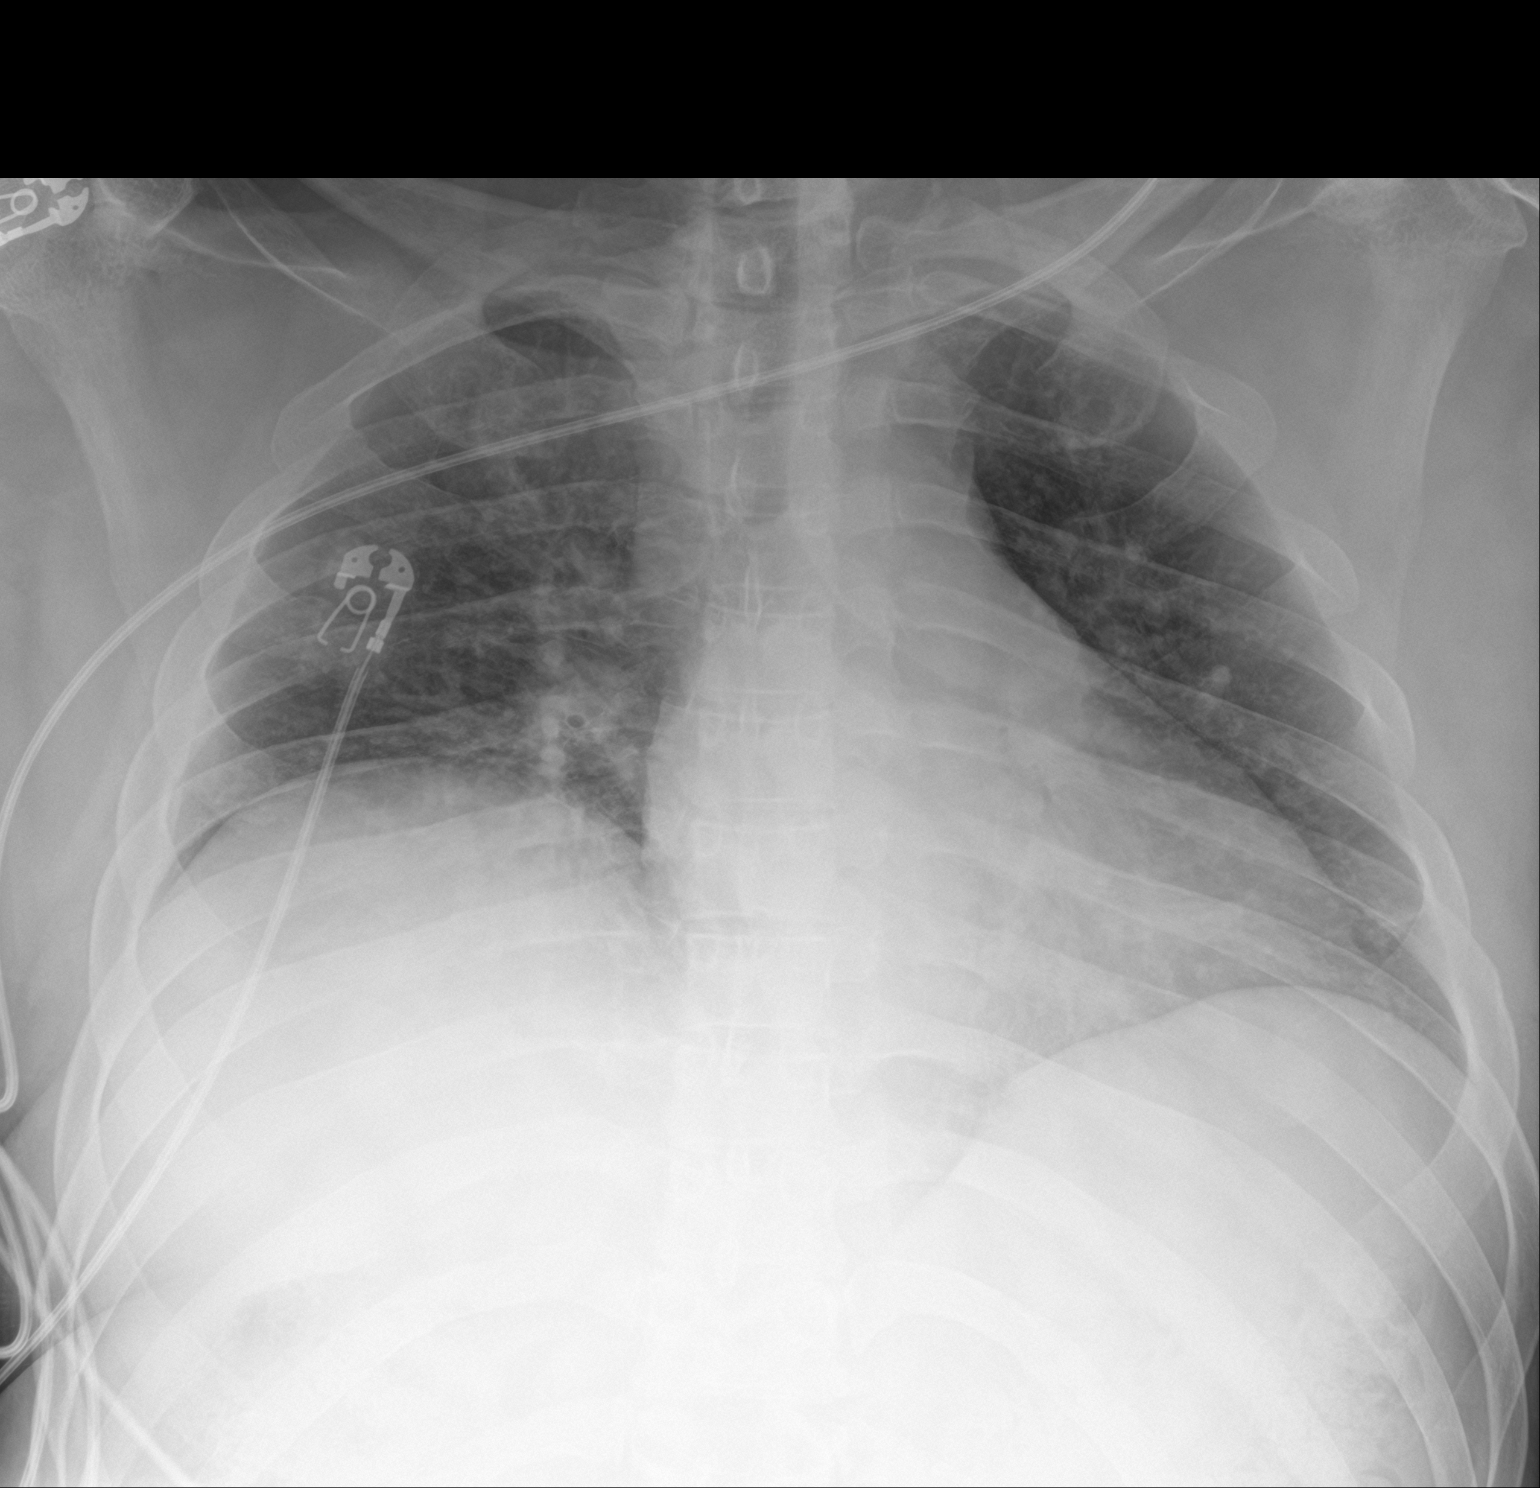

[1 of 1 positions shown; findings below may reference images not displayed]

FINDINGS: There is shallow inspiration. Mild cardiomegaly with mild vascular
congestion. No focal consolidation, pleural effusion, or
pneumothorax. No acute osseous pathology.
IMPRESSION: Mild cardiomegaly with mild vascular congestion. No focal
consolidation.

## 2020-08-26 DIAGNOSIS — F419 Anxiety disorder, unspecified: Secondary | ICD-10-CM | POA: Diagnosis not present

## 2020-09-02 ENCOUNTER — Ambulatory Visit: Payer: Medicare Other | Admitting: Neurology

## 2020-09-04 ENCOUNTER — Ambulatory Visit (INDEPENDENT_AMBULATORY_CARE_PROVIDER_SITE_OTHER): Payer: Medicare Other | Admitting: Neurology

## 2020-09-04 ENCOUNTER — Ambulatory Visit: Payer: Medicare Other | Admitting: Neurology

## 2020-09-04 ENCOUNTER — Encounter: Payer: Self-pay | Admitting: Neurology

## 2020-09-04 VITALS — BP 129/90 | HR 69 | Ht 70.0 in | Wt 218.0 lb

## 2020-09-04 DIAGNOSIS — G629 Polyneuropathy, unspecified: Secondary | ICD-10-CM | POA: Diagnosis not present

## 2020-09-04 DIAGNOSIS — R531 Weakness: Secondary | ICD-10-CM

## 2020-09-04 DIAGNOSIS — R269 Unspecified abnormalities of gait and mobility: Secondary | ICD-10-CM | POA: Diagnosis not present

## 2020-09-04 DIAGNOSIS — R569 Unspecified convulsions: Secondary | ICD-10-CM

## 2020-09-04 NOTE — Progress Notes (Signed)
HISTORY OF PRESENT ILLNESS:[name], 49 year old male returns for followup. He is with his mother.He has a history of intractable seizure disorder since 50 years old. He has been followed in the office since 1990. 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  He has had EMU monitoring at [facility] in the past that included abnormal baseline EEG due to intermittent predominantly right temporal spike, sharp right,and focal slowing. He continues to refuse surgery to remove irritable focus.  Over the years, he has tried different combination, he has been on current medications  Topamax 100 mg twice a day, +50 mg twice a day, Dilantin 100 mg 2 tablets twice a day, Keppra 500 mg 3 tablets b.i.d, brand name  He is also taking Buspirone 10 mg t.i.d., citalopram 20 mg every day for his depression and angry control prescribed by psychiatry.  He continues to have 2-3 seizures every day, complex partial with secondary generalization, he had burst open few helmet due to his seizure, he continued to smoke marijuana and cigarette. He helps his mother taking care of his elderly bed ridden grandmother at home, has raging spells sometimes.   Vimpat 150 twice a day was added since 2015,   He still has seizure almost daily,  He is now taking Topamax 100 plus 50 mg twice a day , Dilantin 200 mg twice a day, Keppra 500 mg 3 tablets twice a day, Vimpat 150 mg twice a day  UPDATE February 07 2017: He came here urgently in early February 2018 because bilateral hands shaking, he was given the prescription of Vraylar, his shaking has much improved after stop the medication.  He is going through dental procedure, his mother had POA, they agreed to proceed with VNS  UPDATE June 09 2017: His seizure overall has much improved, is no longer having a daily basis, is tolerating current medications, but yesterday on June 08 2017, he had few recurrent seizures, he is still going through dental  procedures,  He is taking Keppra 500 mg 3 tablets, Vimpat 150 mg 2 tablets, topiramate 100 mg 2 tablets twice a day  UPDATE Dec 12 2017: His seizure overall has improved some, he also goes to [facility], taking buspiprone, which has helped his mood some,  Continue have frequent migraine headaches, 3-4 times each week, sometimes multiple episode in the day,  He smokes cigarettes some, mother has convinced him to stop use marijuana.  He continue have significant depression, after discussed with mother and patient, we decided to switch Keppra to lamotrigine,  We also talked about VNS placement, he wants to hold off   UPDATE March 20 2018: He is doing much better now with current combinations, lamotrigine 100 mg 2 tablets twice a day, Vimpat 150 mg 2 tablets twice a day, Topamax 100 mg 2 tablets twice a day  Only few recurrent seizure, much better than his previous baseline his mood has improved as well  UPDATE July 20 2018: He is doing much better now with current combination of Vimpat 150 mg 2 tablets twice a day, lamotrigine 100 mg 3 tablets twice a day, Topamax 100 mg twice a day, he only has seizure once or twice each week, they are short lasting, sometimes without recurrent seizure in 1 week, this is in contrast to previous daily multiple seizure episode,  But he complains of dizziness, lightheadedness after each dose of medications,  His depression has much improved too, is more compliant with his mother  EEG in April 2019 showed generalized  epileptiform discharge in the background of mild dysrhythmic slowing.  Update October 04, 2018 He is accompanied by his mother at today's visit, he presented to emergency room on September 24, 2018 after found falling down at home, he has no recollection of the event, mother thought he might have a seizure, his seizure overall is under excellent control, tolerating lamotrigine XR 300 mg 2 tablets at nighttime, Vimpat 2  tablets twice a day, his mood is also under good control.  I personally reviewed MRI of brain: Advanced atrophy of the cerebellum, likely secondary to chronic antiepileptic medication MRI of cervical spine: Multilevel degenerative changes, moderate to severe foraminal narrowing at C3-4, C7-T1, secondary to multilevel uncovertebral disease, there is no evidence of canal stenosis.  Patient complains slow worsening gait abnormality over the past few months, urinary frequency, urgency, chronic low back [name], also mild neck [name] radiating [name] to right hand, arm  Virtual Visit via Phone March 14 2019: I interviewed patient and his mother, his seizure is under very good control, only had 3 seizure and few staring spells over past 4 months.  He continues to have gait abnormalities, using walker, right arm weakness, no incontinence.  EMG/NCS on Nov 10 2018: showed evidence of active right cervical radiculopathy, involving right C7,8, T1, moderate C6 myotomes.   He was seen by neurosurgeon, not a surgical candidate.   UPDATE July 12 2019: He is doing very well from a seizure standpoint with current combination lamotrigine ER 300 mg 2 tablets at bedtime, Vimpat 200 mg 2 tablets twice a day, he had minor staring spells about once a week, rarely has generalized seizures  Continue have significant right hand weakness, but not progressing, continue to have gait abnormality,  Mother reported few episode of sudden onset generalized weakness, could not walk, left arm against gravity, most recent episode was July 10, 2019, he has got up taking his morning medications, he noticed sudden onset weakness, he was able to call mother to check on it, episode lasted about few hours, gradually improved  Reviewed emergency room presentation on November 20 2018, similar episode on September 24, 2018, during the spells, he was able to move his toes and fingers, but very slowly, laboratory evaluations December  showed negative troponin, normal CMP, with glucose of 99, normal CBC,  UPDATE Jan 15 2020: He is overall doing very well, there was no recurrent generalized tonic-clonic seizure, but he has staring spells, transient unresponsiveness almost daily basis, he no longer has to wear his helmet, continue have right hand weakness, gait difficulty has improved  UPDATE February 18 2020: He was seen by neurosurgeon Dr. Kathyrn Sheriff, deemed to be a good candidate for vagal nerve stimulation, however, patient have a tendency to back-and-forth with this decision, all VNS related question was answered, despite polypharmacy large dose of Vimpat 400 mg twice a day, lamotrigine 600 mg daily, he continues to have frequent spells of "small seizures", staring into space, unresponsive for 1 minutes,  this is a significant compared to previous frequent drop attacks, he has to wear helmet in the past,  He continue have significant right more than left hand weakness, gait abnormality, he denies significant sensory changes  He came in today for repeat electrodiagnostic study today, which showed 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  I will refer him to fluoroscopy guided lumbar puncture  I again personally reviewed MRIs in 2019, MRI of cervical spine/brain: No acute abnormality, advanced atrophy of cerebellum likely secondary to chronic antielliptic medication therapy, multilevel cervical degenerative changes, evidence of significant canal stenosis, moderate to severe at bilateral C4-5, C5-6, C6-7, C7-T1 MRI of lumbar spine showed mild degenerative changes, no significant canal or foraminal narrowing.  UPDATE  Sep 04 2020: His mother reported 3 episode of patient woke up in the morning feeling body weakness, slurred speech, but was able to communicate clearly his idea, lasting for hours, gradually resolved, there was no loss of consciousness, this is different from any of his spells in the past,  He has no generalized tonic-clonic seizure, taking Vimpat 200 mg 2 tablets twice a day, lamotrigine xr24 hours 300 mg 2 tablets every night,    REVIEW OF SYSTEMS: Out of a complete 14 system review of symptoms, the patient complains only of the following symptoms, and all other reviewed systems are negative. As above  ALLERGIES: Allergies  Allergen Reactions  . Other     IVIG    HOME MEDICATIONS: Outpatient Medications Prior to Visit  Medication Sig Dispense Refill  . amLODipine (NORVASC) 10 MG tablet Take 10 mg by mouth daily.    . busPIRone (BUSPAR) 15 MG tablet Take 15 mg by mouth 3 (three) times daily.     . fluticasone (FLONASE) 50 MCG/ACT nasal spray Place 1 spray into both nostrils daily as needed for allergies or rhinitis.    . folic acid (FOLVITE) 1 MG tablet Take 1 tablet (1 mg total) by mouth daily. 90 tablet 3  . LamoTRIgine 300 MG TB24 24 hour tablet Take 2 tablets (600 mg total) by mouth at bedtime. 180 tablet 4  . VIMPAT 200 MG TABS tablet TAKE 2 TABLETS BY MOUTH TWICE DAILY 360 tablet 1  . Zinc 100 MG TABS Take 100 mg by mouth daily.     No facility-administered medications prior to visit.    PAST MEDICAL HISTORY: Past Medical History:  Diagnosis Date  . Anxiety   . Depression   . Seizures ([place])     PAST SURGICAL HISTORY: Past Surgical History:  Procedure Laterality Date  . none      FAMILY HISTORY: Family History  Problem Relation Age of Onset  . Asthma Father     SOCIAL HISTORY: Social History   Socioeconomic History  . Marital status: Single    Spouse name: Not on file  . Number of children: 0  . Years of education: 69  . Highest education level: Not on  file  Occupational History    Comment: Disabled  Tobacco Use  . Smoking status: Current Every Day Smoker    Packs/day: 0.50    Years: 20.00    Pack years: 10.00    Types: Cigarettes  . Smokeless tobacco: Never Used  Vaping Use  . Vaping Use: Never used  Substance and Sexual Activity  . Alcohol use: No    Alcohol/week: 0.0 standard drinks  . Drug use: No    Comment: 11/08/16 - reports he has stopped smoking marijuana.  . Sexual activity: Not on file  Other Topics Concern  . Not on file  Social History Narrative   Patient lives at home with his mother Maxie Slovacek ). Patient is disabled. Patient has high school education.   Left handed.   Caffeine- soda - pepsi four cans daily.  Patients father died of over dose.-40   Social Determinants of Health   Financial Resource Strain:   . Difficulty of Paying Living Expenses: Not on file  Food Insecurity:   . Worried About Charity fundraiser in the Last Year: Not on file  . Ran Out of Food in the Last Year: Not on file  Transportation Needs:   . Lack of Transportation (Medical): Not on file  . Lack of Transportation (Non-Medical): Not on file  Physical Activity:   . Days of Exercise per Week: Not on file  . Minutes of Exercise per Session: Not on file  Stress:   . Feeling of Stress : Not on file  Social Connections:   . Frequency of Communication with Friends and Family: Not on file  . Frequency of Social Gatherings with Friends and Family: Not on file  . Attends Religious Services: Not on file  . Active Member of Clubs or Organizations: Not on file  . Attends Archivist Meetings: Not on file  . Marital Status: Not on file  Intimate Partner Violence:   . Fear of Current or Ex-Partner: Not on file  . Emotionally Abused: Not on file  . Physically Abused: Not on file  . Sexually Abused: Not on file   PHYSICAL EXAM  Vitals:   09/04/20 1050  BP: 129/90  Pulse: 69  Weight: 218 lb (98.9 kg)  Height: 5\' 10"   (1.778 m)   Body mass index is 31.28 kg/m.  Generalized: Well developed, in no acute distress   Neurological examination  Mentation: Alert oriented to time, place, history taking. Follows all commands speech and language fluent Cranial nerve II-XII: Pupils were equal round reactive to light. Extraocular movements were full, visual field were full on confrontational test. Facial sensation and strength were normal.  Head turning and shoulder shrug  were normal and symmetric. Motor: Right hand intrinsic muscle atrophy, mild right shoulder abduction weakness, mild right hand grip weakness Sensory: Sensory testing is intact to soft touch on all 4 extremities. No evidence of extinction is noted.  Coordination: Cerebellar testing reveals good finger-nose-finger and heel-to-shin bilaterally.  Gait and station: Has to push off from seated position to stand, gait is wide-based, cautious Reflexes: Deep tendon reflexes were absent bilaterally  DIAGNOSTIC DATA (LABS, IMAGING, TESTING) - I reviewed patient records, labs, notes, testing and imaging myself where available.  Lab Results  Component Value Date   WBC 9.5 04/26/2020   HGB 13.4 04/26/2020   HCT 42.0 04/26/2020   MCV 91.1 04/26/2020   PLT 211 04/26/2020      Component Value Date/Time   NA 136 04/25/2020 0822   NA 137 07/12/2019 1017   K 4.9 04/25/2020 0822   CL 104 04/25/2020 0822   CO2 23 04/25/2020 0822   GLUCOSE 95 04/25/2020 0822   BUN 13 04/25/2020 0822   BUN 10 07/12/2019 1017   CREATININE 1.36 (H) 04/25/2020 0822   CALCIUM 8.9 04/25/2020 0822   PROT 7.9 04/25/2020 0822   PROT 7.2 02/19/2020 0913   ALBUMIN 3.0 (L) 04/25/2020 0822   ALBUMIN 4.5 07/12/2019 1017   AST 32 04/25/2020 0822   ALT 30 04/25/2020 0822   ALKPHOS 58 04/25/2020 0822   BILITOT 2.1 (H) 04/25/2020 0822   BILITOT 0.4 07/12/2019 1017   GFRNONAA >60 04/25/2020 0822   GFRAA >60 04/25/2020 0822   No results found for: CHOL, HDL, LDLCALC, LDLDIRECT,  TRIG, CHOLHDL Lab Results  Component Value Date   HGBA1C  5.1 07/12/2019   Lab Results  Component Value Date   VITAMINB12 310 07/12/2019   Lab Results  Component Value Date   TSH 1.624 04/24/2020      ASSESSMENT AND PLAN 49 y.o. year old male  has a past medical history of Anxiety, Depression, and Seizures ([place]). 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 Kathyrn Sheriff for VNS placement   New onset morning body weakness spells,  Not his typical seizure presentation,  Laboratory evaluation without underlying metabolic derangement,    Marcial Pacas, M.D. Ph.D.  [facility] [place], [place] [address] Phone: [phone] Fax:      [phone]

## 2020-09-10 ENCOUNTER — Telehealth: Payer: Self-pay | Admitting: Neurology

## 2020-09-10 NOTE — Telephone Encounter (Signed)
Please call his mom,  Vimpat level is still pending  Lamotrigine level was 13.9, within normal limit, Mild elevated A1c 5.9, indicating mild elevated glucose level over the past few months, should start diet control, increased exercise  ESR was elevated 51, normal C-reactive protein, this could be nonspecific and findings,  Other possibilities are underlying systemic inflammatory process, such as UTI, respiratory infection, ask him if he has any signs of dark urine, dysuria, fever, cough, does he has any recurrent episode of prolonged morning weakness, make sure he is well hydrated If he has signs of infection, he should contact his primary care physician,  Rest of the laboratory evaluation showed no significant abnormality.

## 2020-09-10 NOTE — Telephone Encounter (Signed)
I returned the call to his mother and provided her with the lab results. She verbalized understanding. She denied any signs or symptoms of any type of infection. She will closely monitor him and call his PCP if needed. She will also watch his diet and encourage both hydration along with exercise. She is aware that we expect his Vimpat to come back within normal limits but will call her again if there are any issues with it. She was in agreement with this plan.

## 2020-09-10 NOTE — Telephone Encounter (Signed)
Gonzalez,Marie(mother) is asking for a call with results to lab work

## 2020-09-11 LAB — COMPREHENSIVE METABOLIC PANEL
ALT: 37 IU/L (ref 0–44)
AST: 18 IU/L (ref 0–40)
Albumin/Globulin Ratio: 1.5 (ref 1.2–2.2)
Albumin: 4.7 g/dL (ref 4.0–5.0)
Alkaline Phosphatase: 119 IU/L (ref 44–121)
BUN/Creatinine Ratio: 9 (ref 9–20)
BUN: 11 mg/dL (ref 6–24)
Bilirubin Total: 0.4 mg/dL (ref 0.0–1.2)
CO2: 21 mmol/L (ref 20–29)
Calcium: 10.1 mg/dL (ref 8.7–10.2)
Chloride: 103 mmol/L (ref 96–106)
Creatinine, Ser: 1.23 mg/dL (ref 0.76–1.27)
GFR calc Af Amer: 80 mL/min/{1.73_m2} (ref >59)
GFR calc non Af Amer: 69 mL/min/{1.73_m2} (ref >59)
Globulin, Total: 3.1 g/dL (ref 1.5–4.5)
Glucose: 84 mg/dL (ref 65–99)
Potassium: 4.3 mmol/L (ref 3.5–5.2)
Sodium: 139 mmol/L (ref 134–144)
Total Protein: 7.8 g/dL (ref 6.0–8.5)

## 2020-09-11 LAB — CBC WITH DIFFERENTIAL/PLATELET
Basophils Absolute: 0.1 x10E3/uL (ref 0.0–0.2)
Basos: 1 %
EOS (ABSOLUTE): 0.4 x10E3/uL (ref 0.0–0.4)
Eos: 5 %
Hematocrit: 46.3 % (ref 37.5–51.0)
Hemoglobin: 16.1 g/dL (ref 13.0–17.7)
Immature Grans (Abs): 0 x10E3/uL (ref 0.0–0.1)
Immature Granulocytes: 0 %
Lymphocytes Absolute: 3.1 x10E3/uL (ref 0.7–3.1)
Lymphs: 41 %
MCH: 28.2 pg (ref 26.6–33.0)
MCHC: 34.8 g/dL (ref 31.5–35.7)
MCV: 81 fL (ref 79–97)
Monocytes Absolute: 0.4 x10E3/uL (ref 0.1–0.9)
Monocytes: 6 %
Neutrophils Absolute: 3.5 x10E3/uL (ref 1.4–7.0)
Neutrophils: 47 %
Platelets: 282 x10E3/uL (ref 150–450)
RBC: 5.71 x10E6/uL (ref 4.14–5.80)
RDW: 13.9 % (ref 11.6–15.4)
WBC: 7.5 x10E3/uL (ref 3.4–10.8)

## 2020-09-11 LAB — C-REACTIVE PROTEIN: CRP: 2 mg/L (ref 0–10)

## 2020-09-11 LAB — SEDIMENTATION RATE: Sed Rate: 51 mm/hr — ABNORMAL HIGH (ref 0–15)

## 2020-09-11 LAB — CK: Total CK: 243 U/L (ref 49–439)

## 2020-09-11 LAB — LAMOTRIGINE LEVEL: Lamotrigine Lvl: 13.9 ug/mL (ref 2.0–20.0)

## 2020-09-11 LAB — HGB A1C W/O EAG: Hgb A1c MFr Bld: 5.9 % — ABNORMAL HIGH (ref 4.8–5.6)

## 2020-09-11 LAB — LACOSAMIDE: Lacosamide: 16.8 ug/mL — ABNORMAL HIGH (ref 5.0–10.0)

## 2020-09-11 LAB — TSH: TSH: 1.24 u[IU]/mL (ref 0.450–4.500)

## 2020-09-29 ENCOUNTER — Other Ambulatory Visit: Payer: Self-pay | Admitting: Neurology

## 2020-10-13 ENCOUNTER — Telehealth: Payer: Self-pay | Admitting: Neurology

## 2020-10-13 NOTE — Telephone Encounter (Signed)
Pt's mother, Kito Cuffe, pharmacy insurance will not pay for LamoTRIgine 300 MG TB24 24 hour tablet any more. He only has 1 week left of medication. Would like a call from the nurse.

## 2020-10-13 NOTE — Telephone Encounter (Addendum)
I called the pharmacy and was informed the medication needs a prior authorization.  He has pharmacy coverage with [employer] (ph: [phone]). UG#[id number].  PA initiated through covermymeds (key: [id number]). Requested an expedited review. PA approved through 10/13/2021.  I returned the call to his mother and notified her of the approval.

## 2020-12-02 DIAGNOSIS — F331 Major depressive disorder, recurrent, moderate: Secondary | ICD-10-CM | POA: Diagnosis not present

## 2020-12-02 DIAGNOSIS — F419 Anxiety disorder, unspecified: Secondary | ICD-10-CM | POA: Diagnosis not present

## 2020-12-22 ENCOUNTER — Ambulatory Visit (INDEPENDENT_AMBULATORY_CARE_PROVIDER_SITE_OTHER): Payer: Medicare Other | Admitting: Neurology

## 2020-12-22 ENCOUNTER — Encounter: Payer: Self-pay | Admitting: Neurology

## 2020-12-22 VITALS — BP 133/90 | HR 69 | Ht 70.0 in | Wt 218.0 lb

## 2020-12-22 DIAGNOSIS — R269 Unspecified abnormalities of gait and mobility: Secondary | ICD-10-CM | POA: Diagnosis not present

## 2020-12-22 DIAGNOSIS — G40209 Localization-related (focal) (partial) symptomatic epilepsy and epileptic syndromes with complex partial seizures, not intractable, without status epilepticus: Secondary | ICD-10-CM | POA: Diagnosis not present

## 2020-12-22 MED ORDER — LAMOTRIGINE ER 300 MG PO TB24: 600.0000 mg | ORAL_TABLET | Freq: Every day | ORAL | 4 refills | Status: DC

## 2020-12-22 NOTE — Progress Notes (Signed)
PATIENT: Andrew [name] DOB: Jul 30, 1971  REASON FOR VISIT: follow up HISTORY FROM: patient  HISTORY OF PRESENT ILLNESS: Today 12/22/20  HISTORY HISTORY OF PRESENT ILLNESS:[name], 50 year old male returns for followup. He is with his mother.He has a history of intractable seizure disorder since 50 years old. He has been followed in the office since 1990. 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  He has had EMU monitoring at [facility] in the past that included abnormal baseline EEG due to intermittent predominantly right temporal spike, sharp right,and focal slowing. He continues to refuse surgery to remove irritable focus.  Over the years, he has tried different combination, he has been on current medications  Topamax 100 mg twice a day, +50 mg twice a day, Dilantin 100 mg 2 tablets twice a day, Keppra 500 mg 3 tablets b.i.d, brand name  He is also taking Buspirone 10 mg t.i.d., citalopram 20 mg every day for his depression and angry control prescribed by psychiatry.  He continues to have 2-3 seizures every day, complex partial with secondary generalization, he had burst open few helmet due to his seizure, he continued to smoke marijuana and cigarette. He helps his mother taking care of his elderly bed ridden grandmother at home, has raging spells sometimes.   Vimpat 150 twice a day was added since2015,   He still has seizure almost daily,  He is now taking Topamax 100 plus 50 mg twice a day , Dilantin 200 mg twice a day, Keppra 500 mg 3 tablets twice a day, Vimpat 150 mg twice a day  UPDATE February 07 2017: He came here urgently in early February 2018 because bilateral hands shaking, he was given the prescription of Vraylar, his shaking has much improved after stop the medication.  He is going through dental procedure, his mother had POA, they agreed to proceed with VNS  UPDATE June 09 2017: His seizure overall has much improved, is no longer having  a daily basis, is tolerating current medications, but yesterday on June 08 2017, he had fewrecurrent seizures, he is still going through dental procedures,  He is taking Keppra 500 mg 3 tablets, Vimpat 150 mg 2 tablets, topiramate 100 mg 2 tablets twice a day  UPDATE Dec 12 2017: His seizure overall has improved some, he also goes to [facility], taking buspiprone, which has helped his mood some,  Continue have frequent migraine headaches, 3-4 times each week, sometimes multiple episode in the day,  He smokes cigarettes some, mother has convinced him to stop use marijuana.  He continue have significant depression, after discussed with mother and patient, we decided to switch Keppra to lamotrigine,  We also talked about VNS placement, he wants to hold off   UPDATE March 20 2018: He is doing much better now with current combinations, lamotrigine 100 mg 2 tablets twice a day, Vimpat 150 mg 2 tablets twice a day, Topamax 100 mg 2 tablets twice a day  Only few recurrent seizure, much better than his previous baseline his mood has improved as well  UPDATE July 20 2018: He is doing much better now with current combination of Vimpat 150 mg 2 tablets twice a day, lamotrigine 100 mg 3 tablets twice a day, Topamax 100 mg twice a day, he only has seizure once or twice each week, they are short lasting, sometimes without recurrent seizure in 1 week, this is in contrast to previous daily multiple seizure episode,  But he complains of dizziness, lightheadedness after each  dose of medications,  His depression has much improved too, is more compliant with his mother  EEG in April 2019 showed generalized epileptiform discharge in the background of mild dysrhythmic slowing.  Update October 04, 2018 He is accompanied by his mother at today's visit, he presented to emergency room on September 24, 2018 after found falling down at home, he has no recollection of the event, mother  thought he might have a seizure, his seizure overall is under excellent control, tolerating lamotrigine XR 300 mg 2 tablets at nighttime, Vimpat 2 tablets twice a day, his mood is also under good control.  I personally reviewed MRI of brain: Advanced atrophy of the cerebellum, likely secondary to chronic antiepileptic medication MRI of cervical spine: Multilevel degenerative changes, moderate to severe foraminal narrowing at C3-4, C7-T1, secondary to multilevel uncovertebral disease, there is no evidence of canal stenosis.  Patient complains slow worsening gait abnormality over the past few months, urinary frequency, urgency, chronic low back [name], also mild neck [name] radiating [name] to right hand, arm  Virtual Visit via Phone March 14 2019: I interviewed patient and his mother, his seizure is under very good control, only had 3 seizure and few staring spells over past 4 months.  He continues to have gait abnormalities, using walker, right arm weakness, no incontinence.  EMG/NCS on Nov 10 2018: showed evidence of active right cervical radiculopathy, involving right C7,8, T1, moderate C6 myotomes.   He was seen by neurosurgeon, not a surgical candidate.   UPDATE July 12 2019: He is doing very well from a seizure standpoint with current combination lamotrigine ER 300 mg 2 tablets at bedtime, Vimpat 200 mg 2 tablets twice a day, he had minor staring spells about once a week, rarely has generalized seizures  Continue have significant right hand weakness, but not progressing, continue to have gait abnormality,  Mother reported few episode of sudden onset generalized weakness, could not walk, left arm against gravity, most recent episode was July 10, 2019, he has got up taking his morning medications, he noticed sudden onset weakness, he was able to call mother to check on it, episode lasted about few hours, gradually improved  Reviewed emergency room presentation on November 20 2018,  similar episode on September 24, 2018, during the spells, he was able to move his toes and fingers, but very slowly, laboratory evaluations December showed negative troponin, normal CMP, with glucose of 99, normal CBC,  UPDATE Jan 15 2020: He is overall doing very well, there was no recurrent generalized tonic-clonic seizure, but he has staring spells, transient unresponsiveness almost daily basis, he no longer has to wear his helmet, continue have right hand weakness, gait difficulty has improved  UPDATE February 18 2020: He was seen by neurosurgeon Dr. Conchita Paris, deemed to be a good candidate for vagal nerve stimulation, however, patient have a tendency to back-and-forth with this decision, all VNS related question was answered, despite polypharmacy large dose of Vimpat 400 mg twice a day, lamotrigine 600 mg daily, he continues to have frequent spells of "small seizures", staring into space, unresponsive for 1 minutes, this is a significant compared to previous frequent drop attacks, he has to wear helmet in the past,  He continue have significant right more than left hand weakness, gait abnormality, he denies significant sensory changes  He came in today for repeat electrodiagnostic study today, which showed 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 I will refer him to fluoroscopy guided lumbar puncture  I again personally reviewed MRIs in 2019, MRI of cervical spine/brain: No acute abnormality, advanced atrophy of cerebellum likely secondary to chronic antielliptic medication therapy, multilevel cervical degenerative changes, evidence of significant canal stenosis, moderate to severe at  bilateral C4-5, C5-6, C6-7, C7-T1 MRI of lumbar spine showed mild degenerative changes, no significant canal or foraminal narrowing.  UPDATE Sep 04 2020: His mother reported 3 episode of patient woke up in the morning feeling body weakness, slurred speech, but was able to communicate clearly his idea, lasting for hours, gradually resolved, there was no loss of consciousness, this is different from any of his spells in the past,  He has no generalized tonic-clonic seizure, taking Vimpat 200 mg 2 tablets twice a day, lamotrigine xr24 hours 300 mg 2 tablets every night,   Update 12/22/2020 SS: Here today with his mom, yesterday, had small event previously reported as morning general weakness, slurred speech, lasted less than 1 hour.  Then returned to normal. Could walk, but had to hold on. This is the only spell of this kind since last seen.   1 generalized tonic-clonic seizure since last seen, on lamotrigine XR 300 mg, 2 tablets at bedtime, Vimpat 400 mg twice daily.  Have decided against VNS for the time being, since doing much better.  Continues to have small spells described as staring off, his mom recognizes them, he can stop them.  Overall, all spells are much less frequent.  No longer wears his helmet.  Has a walker, rarely uses it.  REVIEW OF SYSTEMS: Out of a complete 14 system review of symptoms, the patient complains only of the following symptoms, and all other reviewed systems are negative.  Seizures  ALLERGIES: Allergies  Allergen Reactions  . Other     IVIG    HOME MEDICATIONS: Outpatient Medications Prior to Visit  Medication Sig Dispense Refill  . amLODipine (NORVASC) 10 MG tablet Take 10 mg by mouth daily.    . busPIRone (BUSPAR) 15 MG tablet Take 15 mg by mouth 3 (three) times daily.     . fluticasone (FLONASE) 50 MCG/ACT nasal spray Place 1 spray into both nostrils daily as needed for allergies or rhinitis.    . folic acid (FOLVITE) 1 MG tablet Take 1 tablet (1 mg  total) by mouth daily. 90 tablet 3  . VIMPAT 200 MG TABS tablet TAKE 2 TABLETS BY MOUTH TWICE DAILY 360 tablet 1  . Zinc 100 MG TABS Take 100 mg by mouth daily.    . LamoTRIgine 300 MG TB24 24 hour tablet Take 2 tablets (600 mg total) by mouth at bedtime. 180 tablet 4   No facility-administered medications prior to visit.    PAST MEDICAL HISTORY: Past Medical History:  Diagnosis Date  . Anxiety   . Depression   . Seizures ([place])     PAST SURGICAL HISTORY: Past Surgical History:  Procedure Laterality Date  . none      FAMILY HISTORY: Family History  Problem Relation Age of Onset  . Asthma Father     SOCIAL HISTORY: Social History   Socioeconomic History  . Marital status: Single    Spouse name: Not on file  . Number of children: 0  . Years of education: 81  . Highest education level: Not on  file  Occupational History    Comment: Disabled  Tobacco Use  . Smoking status: Current Every Day Smoker    Packs/day: 0.50    Years: 20.00    Pack years: 10.00    Types: Cigarettes  . Smokeless tobacco: Never Used  Vaping Use  . Vaping Use: Never used  Substance and Sexual Activity  . Alcohol use: No    Alcohol/week: 0.0 standard drinks  . Drug use: No    Comment: 11/08/16 - reports he has stopped smoking marijuana.  . Sexual activity: Not on file  Other Topics Concern  . Not on file  Social History Narrative   Patient lives at home with his mother Daoud Lobue ). Patient is disabled. Patient has high school education.   Left handed.   Caffeine- soda - pepsi four cans daily.   Patients father died of over dose.-40   Social Determinants of Health   Financial Resource Strain: Not on file  Food Insecurity: Not on file  Transportation Needs: Not on file  Physical Activity: Not on file  Stress: Not on file  Social Connections: Not on file  Intimate Partner Violence: Not on file   PHYSICAL EXAM  Vitals:   12/22/20 1241  BP: 133/90  Pulse: 69  Weight: 218 lb  (98.9 kg)  Height: 5\' 10"  (1.778 m)   Body mass index is 31.28 kg/m.  Generalized: Well developed, in no acute distress   Neurological examination  Mentation: Alert oriented to time, place, most history is provided by his mother. Follows all commands speech and language fluent Cranial nerve II-XII: Pupils were equal round reactive to light. Extraocular movements were full, visual field were full on confrontational test. Facial sensation and strength were normal. Head turning and shoulder shrug  were normal and symmetric. Motor: Right hand intrinsic muscle atrophy, mild right shoulder abduction weakness Sensory: Reported decreased sensation to soft touch to right side Coordination: Cerebellar testing reveals good finger-nose-finger and heel-to-shin bilaterally.  Gait and station: Has to push off from seated position to stand, gait is wide-based, cautious Reflexes: Deep tendon reflexes were absent bilaterally  DIAGNOSTIC DATA (LABS, IMAGING, TESTING) - I reviewed patient records, labs, notes, testing and imaging myself where available.  Lab Results  Component Value Date   WBC 7.5 09/04/2020   HGB 16.1 09/04/2020   HCT 46.3 09/04/2020   MCV 81 09/04/2020   PLT 282 09/04/2020      Component Value Date/Time   NA 139 09/04/2020 1205   K 4.3 09/04/2020 1205   CL 103 09/04/2020 1205   CO2 21 09/04/2020 1205   GLUCOSE 84 09/04/2020 1205   GLUCOSE 95 04/25/2020 0822   BUN 11 09/04/2020 1205   CREATININE 1.23 09/04/2020 1205   CALCIUM 10.1 09/04/2020 1205   PROT 7.8 09/04/2020 1205   ALBUMIN 4.7 09/04/2020 1205   AST 18 09/04/2020 1205   ALT 37 09/04/2020 1205   ALKPHOS 119 09/04/2020 1205   BILITOT 0.4 09/04/2020 1205   GFRNONAA 69 09/04/2020 1205   GFRAA 80 09/04/2020 1205   No results found for: CHOL, HDL, LDLCALC, LDLDIRECT, TRIG, CHOLHDL Lab Results  Component Value Date   HGBA1C 5.9 (H) 09/04/2020   Lab Results  Component Value Date   VITAMINB12 310 07/12/2019    Lab Results  Component Value Date   TSH 1.240 09/04/2020   ASSESSMENT AND PLAN 50 y.o. year old male  has a past medical history of Anxiety, Depression, and Seizures ([place]). here with:  1.  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  Intractable epilepsy -1 generalized tonic-clonic seizure since last seen -Continue lamotrigine XR 300 mg, 2 tablets at bedtime -Continue Vimpat 200 mg, 2 tablets twice a day -Has decided to hold off visit with Dr. Kathyrn Sheriff for VNS placement -Vimpat level 16.8, lamotrigine 13.9 in October 2021 -His mom thinks overall doing much better  3.  New onset morning body weakness spells -Extensive laboratory evaluation showed (normal CRP, TSH, CK, CBC, CMP); A1c mildly elevated 5.9, sed rate elevated 51 -1 spell, mild since October, resolves within 1 hour -Unclear etiology of spells, returns to baseline, will continue to monitor -Return in 4-5 months or sooner if needed  I spent 30 minutes of face-to-face and non-face-to-face time with patient.  This included previsit chart review, lab review, study review, order entry, electronic health record documentation, patient education.  Butler Denmark, AGNP-C, DNP 12/22/2020, 1:16 PM [facility] [address], [place], [place] [address] [phone]

## 2020-12-22 NOTE — Patient Instructions (Signed)
Continue current medications Document spells, let us know of seizure  See you back 4-5 months

## 2021-01-20 ENCOUNTER — Other Ambulatory Visit: Payer: Self-pay | Admitting: Neurology

## 2021-01-23 DIAGNOSIS — Z1211 Encounter for screening for malignant neoplasm of colon: Secondary | ICD-10-CM | POA: Diagnosis not present

## 2021-01-23 DIAGNOSIS — I10 Essential (primary) hypertension: Secondary | ICD-10-CM | POA: Diagnosis not present

## 2021-01-28 DIAGNOSIS — Z1211 Encounter for screening for malignant neoplasm of colon: Secondary | ICD-10-CM | POA: Diagnosis not present

## 2021-01-30 LAB — FECAL OCCULT BLOOD, IMMUNOCHEMICAL: IFOBT: POSITIVE

## 2021-02-03 ENCOUNTER — Telehealth: Payer: Self-pay | Admitting: Neurology

## 2021-02-03 NOTE — Telephone Encounter (Signed)
Pt's mother, Gina Costilla (no DPR on file) called, Got a letter from [facility] stating, VIMPAT 200 MG TABS tablet is not covered. Will be getting a temporary supply, call your physician. I am dropping the letter off at your office for Dr. Krista Blue or NP. Would like a call from the nurse.

## 2021-02-03 NOTE — Telephone Encounter (Signed)
Noted  

## 2021-02-03 NOTE — Telephone Encounter (Addendum)
I attempted to start a PA on [email or web address] and received the following response:  [employer] plan is not able to process this request through [place], please contact the plan at [phone] or fax in request to [phone].  I will have to call to initiate the PA over the phone (quantity issue).   Prescribed dosage: Vimpat 200mg , two tablets BID.   I spoke to rep, Cedric. Case ID#[id number]. Approved for #360/90 through 11/21/2021. Member GA#[id number].  I called his mother and provided her with this update.

## 2021-03-11 DIAGNOSIS — H25011 Cortical age-related cataract, right eye: Secondary | ICD-10-CM | POA: Diagnosis not present

## 2021-03-11 DIAGNOSIS — H52223 Regular astigmatism, bilateral: Secondary | ICD-10-CM | POA: Diagnosis not present

## 2021-03-11 DIAGNOSIS — H524 Presbyopia: Secondary | ICD-10-CM | POA: Diagnosis not present

## 2021-03-11 DIAGNOSIS — H1045 Other chronic allergic conjunctivitis: Secondary | ICD-10-CM | POA: Diagnosis not present

## 2021-03-11 DIAGNOSIS — H0288B Meibomian gland dysfunction left eye, upper and lower eyelids: Secondary | ICD-10-CM | POA: Diagnosis not present

## 2021-03-11 DIAGNOSIS — H53023 Refractive amblyopia, bilateral: Secondary | ICD-10-CM | POA: Diagnosis not present

## 2021-03-11 DIAGNOSIS — H538 Other visual disturbances: Secondary | ICD-10-CM | POA: Diagnosis not present

## 2021-03-11 DIAGNOSIS — H0288A Meibomian gland dysfunction right eye, upper and lower eyelids: Secondary | ICD-10-CM | POA: Diagnosis not present

## 2021-03-11 DIAGNOSIS — H5213 Myopia, bilateral: Secondary | ICD-10-CM | POA: Diagnosis not present

## 2021-03-31 DIAGNOSIS — F419 Anxiety disorder, unspecified: Secondary | ICD-10-CM | POA: Diagnosis not present

## 2021-04-07 ENCOUNTER — Encounter: Payer: Self-pay | Admitting: Gastroenterology

## 2021-04-21 ENCOUNTER — Ambulatory Visit: Payer: Medicare Other | Admitting: Neurology

## 2021-05-14 ENCOUNTER — Other Ambulatory Visit: Payer: Self-pay | Admitting: *Deleted

## 2021-05-14 MED ORDER — FOLIC ACID 1 MG PO TABS: 1.0000 mg | ORAL_TABLET | Freq: Every day | ORAL | 3 refills | Status: DC

## 2021-05-20 ENCOUNTER — Encounter: Payer: Medicare Other | Admitting: Gastroenterology

## 2021-05-21 ENCOUNTER — Other Ambulatory Visit (INDEPENDENT_AMBULATORY_CARE_PROVIDER_SITE_OTHER): Payer: Medicare HMO

## 2021-05-21 ENCOUNTER — Ambulatory Visit (INDEPENDENT_AMBULATORY_CARE_PROVIDER_SITE_OTHER): Payer: Medicare HMO | Admitting: Nurse Practitioner

## 2021-05-21 ENCOUNTER — Encounter: Payer: Self-pay | Admitting: Nurse Practitioner

## 2021-05-21 VITALS — BP 128/72 | HR 76 | Ht 70.0 in | Wt 207.0 lb

## 2021-05-21 DIAGNOSIS — R195 Other fecal abnormalities: Secondary | ICD-10-CM

## 2021-05-21 DIAGNOSIS — G40209 Localization-related (focal) (partial) symptomatic epilepsy and epileptic syndromes with complex partial seizures, not intractable, without status epilepticus: Secondary | ICD-10-CM | POA: Diagnosis not present

## 2021-05-21 LAB — COMPREHENSIVE METABOLIC PANEL
ALT: 29 U/L (ref 0–53)
AST: 17 U/L (ref 0–37)
Albumin: 4.4 g/dL (ref 3.5–5.2)
Alkaline Phosphatase: 73 U/L (ref 39–117)
BUN: 14 mg/dL (ref 6–23)
CO2: 24 mEq/L (ref 19–32)
Calcium: 9.7 mg/dL (ref 8.4–10.5)
Chloride: 106 mEq/L (ref 96–112)
Creatinine, Ser: 1.26 mg/dL (ref 0.40–1.50)
GFR: 67.1 mL/min (ref >60.00)
Glucose, Bld: 81 mg/dL (ref 70–99)
Potassium: 3.9 mEq/L (ref 3.5–5.1)
Sodium: 138 mEq/L (ref 135–145)
Total Bilirubin: 0.5 mg/dL (ref 0.2–1.2)
Total Protein: 7.7 g/dL (ref 6.0–8.3)

## 2021-05-21 LAB — CBC WITH DIFFERENTIAL/PLATELET
Basophils Absolute: 0.1 10*3/uL (ref 0.0–0.1)
Basophils Relative: 0.8 % (ref 0.0–3.0)
Eosinophils Absolute: 0.1 10*3/uL (ref 0.0–0.7)
Eosinophils Relative: 2.2 % (ref 0.0–5.0)
HCT: 44 % (ref 39.0–52.0)
Hemoglobin: 15.2 g/dL (ref 13.0–17.0)
Lymphocytes Relative: 39.1 % (ref 12.0–46.0)
Lymphs Abs: 2.5 10*3/uL (ref 0.7–4.0)
MCHC: 34.6 g/dL (ref 30.0–36.0)
MCV: 83.1 fl (ref 78.0–100.0)
Monocytes Absolute: 0.5 10*3/uL (ref 0.1–1.0)
Monocytes Relative: 8.3 % (ref 3.0–12.0)
Neutro Abs: 3.2 10*3/uL (ref 1.4–7.7)
Neutrophils Relative %: 49.6 % (ref 43.0–77.0)
Platelets: 238 10*3/uL (ref 150.0–400.0)
RBC: 5.3 Mil/uL (ref 4.22–5.81)
RDW: 13 % (ref 11.5–15.5)
WBC: 6.4 10*3/uL (ref 4.0–10.5)

## 2021-05-21 MED ORDER — SUPREP BOWEL PREP KIT 17.5-3.13-1.6 GM/177ML PO SOLN: 1.0000 | ORAL | 0 refills | Status: DC

## 2021-05-21 NOTE — Patient Instructions (Addendum)
If you are age 49 or younger, your body mass index should be between 19-25. Your Body mass index is 29.7 kg/m. If this is out of the aformentioned range listed, please consider follow up with your Primary Care Provider.   PROCEDURES: You have been scheduled for a colonoscopy. Please follow the written instructions given to you at your visit today. Please pick up your prep supplies at the pharmacy within the next 1-3 days. If you use inhalers (even only as needed), please bring them with you on the day of your procedure.  We will contact your Neurologist for clearance prior to the colonoscopy.  LABS:  Lab work has been ordered for you today. Our lab is located in the basement. Press "B" on the elevator. The lab is located at the first door on the left as you exit the elevator.  HEALTHCARE LAWS AND MY CHART RESULTS: Due to recent changes in healthcare laws, you may see the results of your imaging and laboratory studies on MyChart before your provider has had a chance to review them.   We understand that in some cases there may be results that are confusing or concerning to you. Not all laboratory results come back in the same time frame and the provider may be waiting for multiple results in order to interpret others.  Please give [place] 48 hours in order for your provider to thoroughly review all the results before contacting the office for clarification of your results.   It was great seeing you today! Thank you for entrusting me with your care and choosing [facility].  Noralyn Pick, CRNP

## 2021-05-21 NOTE — Progress Notes (Signed)
05/21/2021 Andrew [name] [id number] 1971/02/22   CHIEF COMPLAINT: Schedule a colonoscopy   HISTORY OF PRESENT ILLNESS: Andrew [name] is a 50 year old male with a past medical history of hypertension and epilepsy initially diagnosed at the age of 6.  He presents to our office today by Lily Peer NP due to having a positive FIT test 01/28/2021 with recommendations to schedule a colonoscopy.  He is accompanied by his mother.  He reports passing a normal formed brown bowel movement daily.  Sometimes his stools are darker and he questions possibly seen a black stool every once in a while.  He denies taking Pepto-Bismol or iron supplement.  No bright red rectal bleeding.  No GERD symptoms.  No known family history of colorectal  cancer.  He has significant epilepsy followed by neurologist Dr. Krista Blue.  He is on Lamotrigine and Vimpat.  He has absence seizures which are brief 2-3 times weekly.  His mother reported he had a mild seizure on Tuesday, 05/19/2021 after getting out of the bathtub.  She described mild seizure activity with myoclonic jerking movements with brief loss of consciousness. She assisted him to the bathroom floor and covered him up and he awakened few minutes later unaware that he had a seizure.  His gait is unstable with significant balance issues at this time.  His mother stated his balance disorder is chronic and he typically uses a walker at home but he did not want to bring it for this appointment today.  He is scheduled to see Dr. Krista Blue on 06/02/2021.  Past Medical History:  Diagnosis Date   Anxiety    Depression    Seizures ([place])    Past Surgical History:  Procedure Laterality Date   none      Social History: He is single.  He lives with his mother.  Past smoker.  No alcohol use.  Past marijuana use.  Family History: Mother age 50 with hypertension.  Father deceased secondary to drug overdose.  No known family history of esophageal, gastric or colon  cancer.  Allergies  Allergen Reactions   Other     IVIG      Outpatient Encounter Medications as of 05/21/2021  Medication Sig   amLODipine (NORVASC) 10 MG tablet Take 10 mg by mouth daily.   busPIRone (BUSPAR) 15 MG tablet Take 15 mg by mouth 3 (three) times daily.    fluticasone (FLONASE) 50 MCG/ACT nasal spray Place 1 spray into both nostrils daily as needed for allergies or rhinitis.   folic acid (FOLVITE) 1 MG tablet Take 1 tablet (1 mg total) by mouth daily.   LamoTRIgine 300 MG TB24 24 hour tablet Take 2 tablets (600 mg total) by mouth at bedtime.   VIMPAT 200 MG TABS tablet TAKE 2 TABLETS BY MOUTH TWICE DAILY   Zinc 100 MG TABS Take 100 mg by mouth daily.   No facility-administered encounter medications on file as of 05/21/2021.    REVIEW OF SYSTEMS:  Gen: Denies fever, sweats or chills. No weight loss.  CV: Denies chest pain, palpitations or edema. Resp: Denies cough, shortness of breath of hemoptysis.  GI: See HPI. GU : Denies urinary burning, blood in urine, increased urinary frequency or incontinence. MS: Right hand atrophy, lower extremity weakness. Derm: Denies rash, itchiness, skin lesions or unhealing ulcers. Psych: Denies depression, anxiety memory loss. Heme: Denies bruising, bleeding. Neuro: See HPI.  Endo:  Denies any problems with DM, thyroid or adrenal function.   PHYSICAL EXAM:  BP 128/72   Pulse 76   Ht 5\' 10"  (1.778 m)   Wt 207 lb (93.9 kg)   BMI 29.70 kg/m   General: Very pleasant 50 year old male ambulating with a wobbly unstable gait in no acute distress. Head: Normocephalic and atraumatic. Eyes:  Sclerae non-icteric, conjunctive pink. Ears: Normal auditory acuity. Mouth: Absent dentition. No ulcers or lesions.  Neck: Supple, no lymphadenopathy or thyromegaly.  Lungs: Clear bilaterally to auscultation without wheezes, crackles or rhonchi. Heart: Regular rate and rhythm. No murmur, rub or gallop appreciated.  Abdomen: Soft, nontender, non  distended. No masses. No hepatosplenomegaly. Normoactive bowel sounds x 4 quadrants.  Rectal: Deferred. Musculoskeletal: Symmetrical with no gross deformities. Skin: Warm and dry. No rash or lesions on visible extremities. Extremities: No edema. Neurological: Alert oriented x 4, no focal deficits.  Psychological:  Alert and cooperative. Normal mood and affect.  ASSESSMENT AND PLAN:  15.  50 year old male with a positive fit test. -Colonoscopy a [facility] benefits and risks discussed including risk with sedation, risk of bleeding, perforation and infection  -Neurology clearance by Dr. Krista Blue required prior to the patient proceeding with a colonoscopy at [facility].   2.  Epilepsy with weekly absence seizure's, mother described brief myoclonic type seizure with brief loss of consciousness after bathing on Tuesday, 05/19/2021 -Patient is scheduled to see his neurologist Dr. Krista Blue on 06/02/2021  3.  Patient reported stools infrequently are darker, possibly black colored. No GERD symptoms. -CBC, CMP -Patient will monitor bowel movements and he or his mother will call our office if black stools occur -If he is anemic or if he passes a black stool an EGD be recommended at the time of his colonoscopy  Further recommendations to be determined after the above evaluation completed       CC:  No ref. provider found

## 2021-05-22 ENCOUNTER — Telehealth: Payer: Self-pay

## 2021-05-22 NOTE — Telephone Encounter (Signed)
Notified patient's mother of message from [place] , she expressed understanding and agreement, no further questions at this time.

## 2021-05-22 NOTE — Telephone Encounter (Signed)
-----   Message from Noralyn Pick, NP sent at 05/21/2021  7:04 PM EDT ----- Lenna Sciara, pls inform the patient his labs were normal. Thx

## 2021-06-02 ENCOUNTER — Ambulatory Visit (INDEPENDENT_AMBULATORY_CARE_PROVIDER_SITE_OTHER): Payer: Medicare HMO | Admitting: Neurology

## 2021-06-02 ENCOUNTER — Encounter: Payer: Self-pay | Admitting: Neurology

## 2021-06-02 VITALS — BP 136/88 | HR 76 | Ht 70.0 in | Wt 209.5 lb

## 2021-06-02 DIAGNOSIS — F819 Developmental disorder of scholastic skills, unspecified: Secondary | ICD-10-CM | POA: Diagnosis not present

## 2021-06-02 DIAGNOSIS — R531 Weakness: Secondary | ICD-10-CM

## 2021-06-02 DIAGNOSIS — G40209 Localization-related (focal) (partial) symptomatic epilepsy and epileptic syndromes with complex partial seizures, not intractable, without status epilepticus: Secondary | ICD-10-CM | POA: Diagnosis not present

## 2021-06-02 DIAGNOSIS — R269 Unspecified abnormalities of gait and mobility: Secondary | ICD-10-CM

## 2021-06-02 MED ORDER — LACOSAMIDE 200 MG PO TABS: 400.0000 mg | ORAL_TABLET | Freq: Two times a day (BID) | ORAL | 3 refills | Status: DC

## 2021-06-02 MED ORDER — BUSPIRONE HCL 15 MG PO TABS: 15.0000 mg | ORAL_TABLET | Freq: Three times a day (TID) | ORAL | 3 refills | Status: DC

## 2021-06-02 NOTE — Progress Notes (Signed)
ASSESSMENT AND PLAN 50 y.o. year old male  has a past medical history of Anxiety, Depression, and Seizures ([place]). 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,  -Received initial IVIG 6/1, 6/2, on 6/3 infusion through home health, has developed hemolysis with elevated blood pressure, hematuria, required hospital admission,  -MRI of cervical spine showed multilevel degenerative changes, no evidence of spinal  cord compression, variable degrees of foraminal narrowing -Lumbar puncture revealed normal CSF -Symptoms has been stable, discussed with patient and his mother, decided to hold off treatment at this point,  Epilepsy, intellectual disability  Doing very well with current combination of lamotrigine xr 300 mg 2 tablets every night, Vimpat 200 mg 2 tablets twice a day Previous discussion for vagal nerve stimulation, already seen by Dr. Kathyrn Sheriff, but patient and family decided to hold off He only has couple of minor staring off spells couple times each week, mother is very happy about current progress, Refilled his medications  Mood disorder Stable on BuSpar 15 mg 3 times a day,   DIAGNOSTIC DATA (LABS, IMAGING, TESTING) - I reviewed patient records, labs, notes, testing and imaging myself where available.    HISTORY OF PRESENT ILLNESS: Mr. Borg, 50 year old male returns for followup. He is with his mother. He has a history of intractable seizure disorder since 50 years old. He has been followed in the office since 1990. 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   He has had EMU monitoring at [facility] in the past that included abnormal baseline EEG due to intermittent predominantly right temporal spike, sharp right,and focal slowing. He continues to refuse surgery to remove irritable focus.   Over the years, he has tried different combination, he has been on current medications   Topamax  100 mg twice a day, +50 mg twice a day, Dilantin 100 mg 2 tablets twice a day, Keppra 500 mg 3 tablets b.i.d, brand name    He is also taking Buspirone 10 mg t.i.d., citalopram 20 mg every day for his depression and angry control prescribed by psychiatry.   He continues to have 2-3 seizures every day, complex partial with secondary generalization, he had burst open few helmet due to his seizure, he continued to smoke marijuana and cigarette. He helps his mother taking care of his elderly bed ridden grandmother at home, has raging spells sometimes.    Vimpat 150 twice a day was added since 2015,    He still has seizure almost daily,   He is now taking Topamax 100 plus 50 mg twice a day , Dilantin 200 mg twice a day, Keppra 500 mg 3 tablets twice a day, Vimpat 150 mg twice a day   UPDATE February 07 2017: He came here urgently in early February 2018 because bilateral hands shaking, he was given the prescription of Vraylar, his shaking has much improved after stop the medication.   He is going through dental procedure, his mother had POA, they agreed to proceed with VNS   UPDATE June 09 2017: His seizure overall has much improved, is no longer having a daily basis, is tolerating current medications, but yesterday on June 08 2017, he had few recurrent seizures, he is still going through dental procedures,   He is taking Keppra 500 mg 3 tablets, Vimpat 150 mg 2 tablets, topiramate 100 mg 2 tablets twice a day   UPDATE Dec 12 2017: His seizure overall has improved some,  he also goes to [facility] [employer] psych[facility], taking buspiprone, which has helped his mood some,   Continue have frequent migraine headaches, 3-4 times each week, sometimes multiple episode in the day,   He smokes cigarettes some, mother has convinced him to stop use marijuana.   He continue have significant depression, after discussed with mother and patient, we decided to switch Keppra to lamotrigine,   We also talked about VNS  placement, he wants to hold off    UPDATE March 20 2018: He is doing much better now with current combinations, lamotrigine 100 mg 2 tablets twice a day, Vimpat 150 mg 2 tablets twice a day, Topamax 100 mg 2 tablets twice a day   Only few recurrent seizure, much better than his previous baseline his mood has improved as well   UPDATE July 20 2018: He is doing much better now with current combination of Vimpat 150 mg 2 tablets twice a day, lamotrigine 100 mg 3 tablets twice a day, Topamax 100 mg twice a day, he only has seizure once or twice each week, they are short lasting, sometimes without recurrent seizure in 1 week, this is in contrast to previous daily multiple seizure episode,   But he complains of dizziness, lightheadedness after each dose of medications,   His depression has much improved too, is more compliant with his mother   EEG in April 2019 showed generalized epileptiform discharge in the background of mild dysrhythmic slowing.   Update October 04, 2018 He is accompanied by his mother at today's visit, he presented to emergency room on September 24, 2018 after found falling down at home, he has no recollection of the event, mother thought he might have a seizure, his seizure overall is under excellent control, tolerating lamotrigine XR 300 mg 2 tablets at nighttime, Vimpat 2 tablets twice a day, his mood is also under good control.   I personally reviewed MRI of brain: Advanced atrophy of the cerebellum, likely secondary to chronic antiepileptic medication MRI of cervical spine: Multilevel degenerative changes, moderate to severe foraminal narrowing at C3-4, C7-T1, secondary to multilevel uncovertebral disease, there is no evidence of canal stenosis.   Patient complains slow worsening gait abnormality over the past few months, urinary frequency, urgency, chronic low back pain, also mild neck pain radiating pain to right hand, arm   Virtual Visit via Phone March 14 2019: I  interviewed patient and his mother, his seizure is under very good control, only had 3 seizure and few staring spells over past 4 months.   He continues to have gait abnormalities, using walker, right arm weakness, no incontinence.   EMG/NCS on Nov 10 2018: showed evidence of active right cervical radiculopathy, involving right C7,8, T1, moderate C6 myotomes.    He was seen by neurosurgeon, not a surgical candidate.    UPDATE July 12 2019: He is doing very well from a seizure standpoint with current combination lamotrigine ER 300 mg 2 tablets at bedtime, Vimpat 200 mg 2 tablets twice a day, he had minor staring spells about once a week, rarely has generalized seizures   Continue have significant right hand weakness, but not progressing, continue to have gait abnormality,   Mother reported few episode of sudden onset generalized weakness, could not walk, left arm against gravity, most recent episode was July 10, 2019, he has got up taking his morning medications, he noticed sudden onset weakness, he was able to call mother to check on it, episode lasted about few hours, gradually improved  Reviewed emergency room presentation on November 20 2018, similar episode on September 24, 2018, during the spells, he was able to move his toes and fingers, but very slowly, laboratory evaluations December showed negative troponin, normal CMP, with glucose of 99, normal CBC,   UPDATE Jan 15 2020: He is overall doing very well, there was no recurrent generalized tonic-clonic seizure, but he has staring spells, transient unresponsiveness almost daily basis, he no longer has to wear his helmet, continue have right hand weakness, gait difficulty has improved   UPDATE February 18 2020: He was seen by neurosurgeon Dr. Kathyrn Sheriff, deemed to be a good candidate for vagal nerve stimulation, however, patient have a tendency to back-and-forth with this decision, all VNS related question was answered, despite polypharmacy  large dose of Vimpat 400 mg twice a day, lamotrigine 600 mg daily, he continues to have frequent spells of "small seizures", staring into space, unresponsive for 1 minutes,  this is a significant compared to previous frequent drop attacks, he has to wear helmet in the past,   He continue have significant right more than left hand weakness, gait abnormality, he denies significant sensory changes   He came in today for repeat electrodiagnostic study today, which showed 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  I will refer him to fluoroscopy guided lumbar puncture   I again personally reviewed MRIs in 2019, MRI of cervical spine/brain: No acute abnormality, advanced atrophy of cerebellum likely secondary to chronic antielliptic medication therapy, multilevel cervical degenerative changes, evidence of significant canal stenosis, moderate to severe at bilateral C4-5, C5-6, C6-7, C7-T1 MRI of lumbar spine showed mild degenerative changes, no significant canal or foraminal narrowing.   UPDATE Sep 04 2020: His mother reported 3 episode of patient woke up in the morning feeling body weakness, slurred speech, but was able to communicate clearly his idea, lasting for hours, gradually resolved, there was no loss of consciousness, this is different from any of his spells in the past,   He has no generalized tonic-clonic seizure, taking Vimpat 200 mg 2 tablets twice a day, lamotrigine xr24 hours 300 mg 2 tablets every night,  UPDATE June 02 2021: He is accompanied by his mother at today's visit, overall doing very well, only he has couple staring off spells each week, tolerating lamotrigine xr 300 mg  2 tablets every night, Vimpat 200 mg 2 tablets twice a day,  Continue has right hand muscle atrophy, weakness, this started gradually since 2019, no significant progression, no sensory component, previous EMG findings suggestive of multifocal motor neuropathy, no treatable etiology found, had a short trial in June 2021, received home IVIG, but developed hematuria, hypertension, required hospital admission, thinks due to IVIG induced hemolysis, his symptoms a stable, mother does not want to consider another challenge with IVIG or other medication at this point,  Mood disorder, with change of insurance company, no longer seeing psychiatrist, continue BuSpar 15 mg 3 times a day  REVIEW OF SYSTEMS: Out of a complete 14 system review of symptoms, the patient complains only of the following symptoms, and all other reviewed systems are negative.  Seizures  ALLERGIES: Allergies  Allergen Reactions   Other     IVIG    HOME MEDICATIONS: Outpatient Medications Prior to  Visit  Medication Sig Dispense Refill   amLODipine (NORVASC) 10 MG tablet Take 10 mg by mouth daily.     busPIRone (BUSPAR) 15 MG tablet Take 15 mg by mouth 3 (three) times daily.      fluticasone (FLONASE) 50 MCG/ACT nasal spray Place 1 spray into both nostrils daily as needed for allergies or rhinitis.     folic acid (FOLVITE) 1 MG tablet Take 1 tablet (1 mg total) by mouth daily. 90 tablet 3   LamoTRIgine 300 MG TB24 24 hour tablet Take 2 tablets (600 mg total) by mouth at bedtime. 180 tablet 4   SUPREP BOWEL PREP KIT 17.5-3.13-1.6 GM/177ML SOLN Take 1 kit by mouth as directed. For colonoscopy prep 354 mL 0   VIMPAT 200 MG TABS tablet TAKE 2 TABLETS BY MOUTH TWICE DAILY 360 tablet 1   Zinc 100 MG TABS Take 100 mg by mouth daily.     No facility-administered medications prior to visit.    PAST MEDICAL HISTORY: Past Medical History:  Diagnosis Date   Anxiety    Depression    Seizures ([place])     PAST SURGICAL HISTORY: Past  Surgical History:  Procedure Laterality Date   none      FAMILY HISTORY: Family History  Problem Relation Age of Onset   Asthma Father     SOCIAL HISTORY: Social History   Socioeconomic History   Marital status: Single    Spouse name: Not on file   Number of children: 0   Years of education: 12   Highest education level: Not on file  Occupational History    Comment: Disabled  Tobacco Use   Smoking status: Former    Packs/day: 0.50    Years: 20.00    Pack years: 10.00    Types: Cigarettes   Smokeless tobacco: Never  Vaping Use   Vaping Use: Never used  Substance and Sexual Activity   Alcohol use: No    Alcohol/week: 0.0 standard drinks   Drug use: No    Comment: 11/08/16 - reports he has stopped smoking marijuana.   Sexual activity: Not on file  Other Topics Concern   Not on file  Social History Narrative   Patient lives at home with his mother Jhaden Pizzuto ). Patient is disabled. Patient has high school education.   Left handed.   Caffeine- soda - pepsi four cans daily.   Patients father died of over dose.-40   Social Determinants of Health   Financial Resource Strain: Not on file  Food Insecurity: Not on file  Transportation Needs: Not on file  Physical Activity: Not on file  Stress: Not on file  Social Connections: Not on file  Intimate Partner Violence: Not on file   PHYSICAL EXAM  Vitals:   06/02/21 0708  BP: 136/88  Pulse: 76  Weight: 209 lb 8 oz (95 kg)  Height: 5' 10" (1.778 m)   Body mass index is 30.06 kg/m.  Generalized: Well developed, in no acute distress   Neurological examination  Mentation: Alert oriented to time, place, most history is provided by his mother. Follows all commands speech and language fluent Cranial nerve II-XII: Pupils were equal round reactive to light. Extraocular movements were full, visual field were full on confrontational test. Facial sensation and strength were normal. Head turning and shoulder shrug  were  normal and symmetric. Motor: Right hand intrinsic muscle atrophy, mild right shoulder abduction weakness Sensory: Reported decreased sensation to soft touch to right side Coordination: Cerebellar  testing reveals good finger-nose-finger and heel-to-shin bilaterally.  Gait and station: Has to push off from seated position to stand, gait is wide-based, cautious Reflexes: Deep tendon reflexes were absent bilaterally    Marcial Pacas, M.D. Ph.D.  [facility] [place], [place] [address] Phone: [phone] Fax:      [phone]

## 2021-06-08 ENCOUNTER — Telehealth: Payer: Self-pay

## 2021-06-08 NOTE — Telephone Encounter (Signed)
[facility] patient is scheduled 07/09/21 at [facility].  Neurology clearance by Dr. Krista Blue required prior to the patient proceeding with a colonoscopy at [facility].  Please advise if clear to proceed.

## 2021-06-16 ENCOUNTER — Telehealth: Payer: Self-pay

## 2021-06-16 ENCOUNTER — Telehealth: Payer: Self-pay | Admitting: Neurology

## 2021-06-16 NOTE — Telephone Encounter (Signed)
[facility] [address] [place], [place]  [address] Phone:  [phone]   Fax:  [phone]   June 16, 2021     [name] [address] [place] [address]   To [facility]:   This letter is to confirm that Andrew [name] is under my care for his epilepsy.  He is taking lamotrigine extended release 300 mg 2 tablets every night, Vimpat 200 mg 2 tablets twice a day.   His seizure disorder is overall under good control.   It is okay for him to proceed elective colonoscopy.  Please allow him to take his antiepileptic medications peri-procedure period of time.   If you have any questions or concerns, please don't hesitate to call.   Sincerely,       Marcial Pacas, MD. Ph.D.

## 2021-06-16 NOTE — Telephone Encounter (Signed)
Pre-Colonoscopy Clearance

## 2021-06-16 NOTE — Telephone Encounter (Signed)
Clearance letter is under Letter tab

## 2021-06-16 NOTE — Telephone Encounter (Signed)
Error

## 2021-06-19 NOTE — Progress Notes (Signed)
Addendum: Reviewed and agree with assessment and management plan. Caliber Landess M, MD  

## 2021-07-09 ENCOUNTER — Encounter (INDEPENDENT_AMBULATORY_CARE_PROVIDER_SITE_OTHER): Payer: Self-pay

## 2021-07-09 ENCOUNTER — Ambulatory Visit (HOSPITAL_COMMUNITY)
Admission: RE | Admit: 2021-07-09 | Discharge: 2021-07-09 | Disposition: A | Discharge status: Home / Self Care | Payer: Medicare HMO | Attending: Internal Medicine | Admitting: Internal Medicine

## 2021-07-09 ENCOUNTER — Ambulatory Visit (HOSPITAL_COMMUNITY): Payer: Medicare HMO | Admitting: Certified Registered Nurse Anesthetist

## 2021-07-09 ENCOUNTER — Other Ambulatory Visit: Payer: Self-pay

## 2021-07-09 ENCOUNTER — Encounter (HOSPITAL_COMMUNITY): Payer: Self-pay | Admitting: Internal Medicine

## 2021-07-09 ENCOUNTER — Encounter (HOSPITAL_COMMUNITY)
Admission: RE | Disposition: A | Discharge status: Home / Self Care | Payer: Self-pay | Source: Home / Self Care | Attending: Internal Medicine

## 2021-07-09 DIAGNOSIS — Z87891 Personal history of nicotine dependence: Secondary | ICD-10-CM | POA: Insufficient documentation

## 2021-07-09 DIAGNOSIS — D128 Benign neoplasm of rectum: Secondary | ICD-10-CM

## 2021-07-09 DIAGNOSIS — R195 Other fecal abnormalities: Secondary | ICD-10-CM | POA: Insufficient documentation

## 2021-07-09 DIAGNOSIS — K621 Rectal polyp: Secondary | ICD-10-CM | POA: Diagnosis not present

## 2021-07-09 DIAGNOSIS — D125 Benign neoplasm of sigmoid colon: Secondary | ICD-10-CM | POA: Diagnosis not present

## 2021-07-09 DIAGNOSIS — Z79899 Other long term (current) drug therapy: Secondary | ICD-10-CM | POA: Diagnosis not present

## 2021-07-09 HISTORY — PX: POLYPECTOMY: SHX5525

## 2021-07-09 HISTORY — PX: COLONOSCOPY WITH PROPOFOL: SHX5780

## 2021-07-09 SURGERY — COLONOSCOPY WITH PROPOFOL
Anesthesia: Monitor Anesthesia Care

## 2021-07-09 MED ORDER — SODIUM CHLORIDE 0.9 % IV SOLN: INTRAVENOUS | Status: DC

## 2021-07-09 MED ORDER — LACTATED RINGERS IV SOLN
INTRAVENOUS | Status: DC
  Administered 2021-07-09: 1000 mL via INTRAVENOUS

## 2021-07-09 MED ORDER — PROPOFOL 500 MG/50ML IV EMUL
INTRAVENOUS | Status: DC | PRN
  Administered 2021-07-09: 125 ug/kg/min via INTRAVENOUS

## 2021-07-09 SURGICAL SUPPLY — 22 items

## 2021-07-09 NOTE — Op Note (Signed)
[facility] Patient Name: Andrew [name] Procedure Date: 07/09/2021 MRN: [id number] Attending MD: Jerene Bears , MD Date of Birth: 11-22-1971 CSN: [id number] Age: 50 Admit Type: Outpatient Procedure:                Colonoscopy Indications:              Positive fecal immunochemical test Providers:                Lajuan Lines. Hilarie Fredrickson, MD, Particia Nearing, RN, Elspeth Cho                            Tech., Technician, Herbie Drape, CRNA Referring MD:             Fredric Dine. Lott Medicines:                Monitored Anesthesia Care Complications:            No immediate complications. Estimated Blood Loss:     Estimated blood loss was minimal. Procedure:                Pre-Anesthesia Assessment:                           - Prior to the procedure, a History and Physical                            was performed, and patient medications and                            allergies were reviewed. The patient's tolerance of                            previous anesthesia was also reviewed. The risks                            and benefits of the procedure and the sedation                            options and risks were discussed with the patient.                            All questions were answered, and informed consent                            was obtained. Prior Anticoagulants: The patient has                            taken no previous anticoagulant or antiplatelet                            agents. ASA Grade Assessment: III - A patient with                            severe systemic disease. After reviewing the risks  and benefits, the patient was deemed in                            satisfactory condition to undergo the procedure.                           After obtaining informed consent, the colonoscope                            was passed under direct vision. Throughout the                            procedure, the patient's blood pressure, pulse, and                             oxygen saturations were monitored continuously. The                            [id number] Olympus colonoscope was                            introduced through the anus and advanced to the                            terminal ileum. The colonoscopy was performed                            without difficulty. The patient tolerated the                            procedure well. The quality of the bowel                            preparation was excellent. The terminal ileum,                            ileocecal valve, appendiceal orifice, and rectum                            were photographed. Scope In: 8:24:44 AM Scope Out: 8:37:29 AM Scope Withdrawal Time: 0 hours 9 minutes 48 seconds  Total Procedure Duration: 0 hours 12 minutes 45 seconds  Findings:      The digital rectal exam was normal.      The terminal ileum appeared normal.      Two sessile polyps were found in the proximal rectum and distal sigmoid       colon. The polyps were 3 to 5 mm in size. These polyps were removed with       a cold snare. Resection and retrieval were complete.      The exam was otherwise without abnormality on direct and retroflexion       views. Impression:               - The examined portion of the ileum was normal.                           -  Two 3 to 5 mm polyps in the proximal rectum and                            in the distal sigmoid colon, removed with a cold                            snare. Resected and retrieved.                           - The examination was otherwise normal on direct                            and retroflexion views. Moderate Sedation:      N/A Recommendation:           - Patient has a contact number available for                            emergencies. The signs and symptoms of potential                            delayed complications were discussed with the                            patient. Return to normal activities tomorrow.                             Written discharge instructions were provided to the                            patient.                           - Resume previous diet.                           - Continue present medications.                           - Await pathology results.                           - Repeat colonoscopy is recommended. The                            colonoscopy date will be determined after pathology                            results from today's exam become available for                            review. Procedure Code(s):        --- Professional ---                           [phone], Colonoscopy, flexible; with removal of  tumor(s), polyp(s), or other lesion(s) by snare                            technique Diagnosis Code(s):        --- Professional ---                           K62.1, Rectal polyp                           K63.5, Polyp of colon                           R19.5, Other fecal abnormalities CPT copyright 2019 American Medical Association. All rights reserved. The codes documented in this report are preliminary and upon coder review may  be revised to meet current compliance requirements. Jerene Bears, MD 07/09/2021 8:47:56 AM This report has been signed electronically. Number of Addenda: 0

## 2021-07-09 NOTE — Discharge Instructions (Signed)

## 2021-07-09 NOTE — Anesthesia Postprocedure Evaluation (Signed)
Anesthesia Post Note  Patient: Andrew [name]  Procedure(s) Performed: COLONOSCOPY WITH PROPOFOL POLYPECTOMY     Patient location during evaluation: PACU Anesthesia Type: MAC Level of consciousness: awake and alert Pain management: pain level controlled Vital Signs Assessment: post-procedure vital signs reviewed and stable Respiratory status: spontaneous breathing, nonlabored ventilation, respiratory function stable and patient connected to nasal cannula oxygen Cardiovascular status: stable and blood pressure returned to baseline Postop Assessment: no apparent nausea or vomiting Anesthetic complications: no   No notable events documented.  Last Vitals:  Vitals:   07/09/21 0848 07/09/21 0850  BP: (!) 106/53 101/68  Pulse: 60 62  Resp: 18 (!) 24  Temp:    SpO2: 97% 97%    Last Pain:  Vitals:   07/09/21 0850  TempSrc:   PainSc: 0-No pain                 [name]

## 2021-07-09 NOTE — Anesthesia Preprocedure Evaluation (Signed)
Anesthesia Evaluation  Patient identified by MRN, date of birth, ID band Patient awake    Reviewed: Allergy & Precautions, NPO status , Patient's Chart, lab work & pertinent test results  Airway Mallampati: II  TM Distance: >3 FB Neck ROM: Full    Dental no notable dental hx.    Pulmonary neg pulmonary ROS, former smoker,    Pulmonary exam normal breath sounds clear to auscultation       Cardiovascular hypertension, Normal cardiovascular exam Rhythm:Regular Rate:Normal     Neuro/Psych Seizures -,  Bipolar Disorder    GI/Hepatic negative GI ROS, Neg liver ROS,   Endo/Other  negative endocrine ROS  Renal/GU negative Renal ROS  negative genitourinary   Musculoskeletal negative musculoskeletal ROS (+)   Abdominal   Peds negative pediatric ROS (+)  Hematology negative hematology ROS (+)   Anesthesia Other Findings   Reproductive/Obstetrics negative OB ROS                             Anesthesia Physical Anesthesia Plan  ASA: 3  Anesthesia Plan: MAC   Post-op Pain Management:    Induction: Intravenous  PONV Risk Score and Plan: 1 and Propofol infusion and Treatment may vary due to age or medical condition  Airway Management Planned: Simple Face Mask  Additional Equipment:   Intra-op Plan:   Post-operative Plan:   Informed Consent: I have reviewed the patients History and Physical, chart, labs and discussed the procedure including the risks, benefits and alternatives for the proposed anesthesia with the patient or authorized representative who has indicated his/her understanding and acceptance.     Dental advisory given  Plan Discussed with: CRNA and Surgeon  Anesthesia Plan Comments:         Anesthesia Quick Evaluation

## 2021-07-09 NOTE — Transfer of Care (Signed)
Immediate Anesthesia Transfer of Care Note  Patient: Andrew [name]  Procedure(s) Performed: COLONOSCOPY WITH PROPOFOL POLYPECTOMY  Patient Location: PACU  Anesthesia Type:MAC  Level of Consciousness: sedated, patient cooperative and responds to stimulation  Airway & Oxygen Therapy: Patient Spontanous Breathing and Patient connected to face mask oxygen  Post-op Assessment: Report given to RN and Post -op Vital signs reviewed and stable  Post vital signs: Reviewed and stable  Last Vitals:  Vitals Value Taken Time  BP    Temp    Pulse 64 07/09/21 0843  Resp 19 07/09/21 0844  SpO2 98 % 07/09/21 0843  Vitals shown include unvalidated device data.  Last Pain:  Vitals:   07/09/21 0707  TempSrc: Temporal  PainSc: 0-No pain         Complications: No notable events documented.

## 2021-07-09 NOTE — H&P (Addendum)
GASTROENTEROLOGY PROCEDURE H&P NOTE   Primary Care Physician: Vassie Moment, MD (Inactive)    Reason for Procedure:   + FOBT   Plan:    colonoscopy   The nature of the procedure, as well as the risks, benefits, and alternatives were carefully and thoroughly reviewed with the patient. Ample time for discussion and questions allowed. The patient understood, was satisfied, and agreed to proceed.     HPI: Andrew [name] is a 50 y.o. male who presents for colonoscopy to eval + FOBT.  Seen in the office in June.  No change in medical issues since that time and no new complaints today  Past Medical History:  Diagnosis Date   Anxiety    Depression    Seizures ([place])     Past Surgical History:  Procedure Laterality Date   none      Prior to Admission medications   Medication Sig Start Date End Date Taking? Authorizing Provider  amLODipine (NORVASC) 10 MG tablet Take 10 mg by mouth daily.   Yes [provider]  busPIRone (BUSPAR) 15 MG tablet Take 1 tablet (15 mg total) by mouth 3 (three) times daily. 06/02/21  Yes Marcial Pacas, MD  docusate sodium (COLACE) 100 MG capsule Take 100 mg by mouth daily as needed for mild constipation.   Yes [provider]  folic acid (FOLVITE) 1 MG tablet Take 1 mg by mouth daily.   Yes [provider]  lacosamide (VIMPAT) 200 MG TABS tablet Take 2 tablets (400 mg total) by mouth 2 (two) times daily. 06/02/21  Yes Marcial Pacas, MD  LamoTRIgine 300 MG TB24 24 hour tablet Take 2 tablets (600 mg total) by mouth at bedtime. 12/22/20  Yes Suzzanne Cloud, NP  zinc gluconate 50 MG tablet Take 50 mg by mouth daily.   Yes [provider]  acetaminophen (TYLENOL) 325 MG tablet Take 325 mg by mouth every 6 (six) hours as needed for moderate pain.    [provider]  fluticasone (FLONASE) 50 MCG/ACT nasal spray Place 1 spray into both nostrils daily as needed for allergies or rhinitis.    [provider]  SUPREP  BOWEL PREP KIT 17.5-3.13-1.6 GM/177ML SOLN Take 1 kit by mouth as directed. For colonoscopy prep 05/21/21   Noralyn Pick, NP    Current Facility-Administered Medications  Medication Dose Route Frequency Provider Last Rate Last Admin   0.9 %  sodium chloride infusion   Intravenous Continuous Carl Best M, NP       0.9 %  sodium chloride infusion   Intravenous Continuous Ayen Viviano, Lajuan Lines, MD       lactated ringers infusion   Intravenous Continuous Della Scrivener, Lajuan Lines, MD 125 mL/hr at 07/09/21 0740 1,000 mL at 07/09/21 0740    Allergies as of 05/21/2021 - Review Complete 05/21/2021  Allergen Reaction Noted   Other  09/04/2020    Family History  Problem Relation Age of Onset   Asthma Father     Social History   Socioeconomic History   Marital status: Single    Spouse name: Not on file   Number of children: 0   Years of education: 12   Highest education level: Not on file  Occupational History    Comment: Disabled  Tobacco Use   Smoking status: Former    Packs/day: 0.50    Years: 20.00    Pack years: 10.00    Types: Cigarettes   Smokeless tobacco: Never  Vaping Use  Vaping Use: Never used  Substance and Sexual Activity   Alcohol use: No    Alcohol/week: 0.0 standard drinks   Drug use: No    Comment: 11/08/16 - reports he has stopped smoking marijuana.   Sexual activity: Not on file  Other Topics Concern   Not on file  Social History Narrative   Patient lives at home with his mother Andrew [name] ). Patient is disabled. Patient has high school education.   Left handed.   Caffeine- soda - pepsi four cans daily.   Patients father died of over dose.-40   Social Determinants of Health   Financial Resource Strain: Not on file  Food Insecurity: Not on file  Transportation Needs: Not on file  Physical Activity: Not on file  Stress: Not on file  Social Connections: Not on file  Intimate Partner Violence: Not on file    Physical Exam: Vital signs in  last 24 hours: '@BP'  (!) 148/94   Pulse 66   Temp (!) 97.2 F (36.2 C) (Temporal)   Resp 20   Ht '5\' 10"'  (1.778 m)   Wt 94.8 kg   SpO2 99%   BMI 29.99 kg/m  GEN: NAD EYE: Sclerae anicteric ENT: MMM CV: Non-tachycardic Pulm: CTA b/l GI: Soft, NT/ND NEURO:  Alert & Oriented x 3   Andrew Jarred, MD [facility]  07/09/2021 8:11 AM

## 2021-07-10 ENCOUNTER — Encounter (HOSPITAL_COMMUNITY): Payer: Self-pay | Admitting: Internal Medicine

## 2021-07-10 LAB — SURGICAL PATHOLOGY

## 2021-07-13 ENCOUNTER — Encounter: Payer: Self-pay | Admitting: Internal Medicine

## 2021-07-21 ENCOUNTER — Telehealth: Payer: Self-pay | Admitting: Neurology

## 2021-07-21 MED ORDER — LACOSAMIDE 200 MG PO TABS: 400.0000 mg | ORAL_TABLET | Freq: Two times a day (BID) | ORAL | 3 refills | Status: DC

## 2021-07-21 NOTE — Telephone Encounter (Signed)
Meds ordered this encounter  Medications   lacosamide (VIMPAT) 200 MG TABS tablet    Sig: Take 2 tablets (400 mg total) by mouth 2 (two) times daily.    Dispense:  360 tablet    Refill:  3

## 2021-07-21 NOTE — Telephone Encounter (Signed)
Pt's mother, Cordarrel Regalbuto request refill lacosamide (VIMPAT) 200 MG TABS tablet at [facility]

## 2021-07-21 NOTE — Telephone Encounter (Signed)
Returned call to patient's mom, pharmacy told her they didn't have any refills and he will be out this week.   Called pharmacy and was told they never received the refill despite having a pharmacy receive receipt dated 06/02/21 @ 0743.  Will send in new refill.

## 2021-09-08 ENCOUNTER — Telehealth: Payer: Self-pay | Admitting: Neurology

## 2021-09-08 NOTE — Telephone Encounter (Signed)
Pt's mother, Myrl Lazarus (on [place]) called, pharmacy said can not get his lacosamide (VIMPAT) 200 MG TABS tablet until the 10/23. He will run out of medication Saturday. Would like a call from the nurse.

## 2021-09-09 NOTE — Telephone Encounter (Signed)
The patient takes Vimpat 200mg , two tabs BID.  Per [place] narcotic registry, recent rx filled for #120 on 08/21/21. He should have enough medication. We will look into this further when [facility] opens (# [phone]).

## 2021-09-09 NOTE — Telephone Encounter (Signed)
I called [facility] and spoke to [place]. She said they filled it for #120 on 08/21/21 and it was picked up on 08/23/21.   I called his mother, Verdis Frederickson. She confirmed she picked up the prescription that date. In further conversation with her, it was discovered that the prescription came in two sealed bottles with #60 in each. She realized there was still an unopened bottle in her medication cabinet with the other half of his prescription. She apologized for the confusion.

## 2021-10-24 NOTE — Progress Notes (Signed)
Chart reviewed, agree above plan ?

## 2021-11-09 ENCOUNTER — Other Ambulatory Visit: Payer: Self-pay | Admitting: Neurology

## 2021-11-09 NOTE — Telephone Encounter (Signed)
Rx refilled.

## 2021-12-09 ENCOUNTER — Other Ambulatory Visit: Payer: Self-pay

## 2021-12-09 ENCOUNTER — Ambulatory Visit (INDEPENDENT_AMBULATORY_CARE_PROVIDER_SITE_OTHER): Payer: Medicare Other | Admitting: Neurology

## 2021-12-09 ENCOUNTER — Encounter: Payer: Self-pay | Admitting: Neurology

## 2021-12-09 VITALS — BP 133/89 | HR 66 | Ht 70.0 in | Wt 212.5 lb

## 2021-12-09 DIAGNOSIS — F419 Anxiety disorder, unspecified: Secondary | ICD-10-CM | POA: Diagnosis not present

## 2021-12-09 DIAGNOSIS — G40209 Localization-related (focal) (partial) symptomatic epilepsy and epileptic syndromes with complex partial seizures, not intractable, without status epilepticus: Secondary | ICD-10-CM

## 2021-12-09 DIAGNOSIS — G629 Polyneuropathy, unspecified: Secondary | ICD-10-CM | POA: Diagnosis not present

## 2021-12-09 MED ORDER — LAMOTRIGINE ER 300 MG PO TB24: 600.0000 mg | ORAL_TABLET | Freq: Every day | ORAL | 4 refills | Status: DC

## 2021-12-09 NOTE — Patient Instructions (Signed)
Continue current medications See you back in 6 months

## 2021-12-09 NOTE — Progress Notes (Signed)
PATIENT: Andrew [name] DOB: 01/07/1971  REASON FOR VISIT: Follow up HISTORY FROM: Patient and his mother PRIMARY NEUROLOGIST: Dr. Krista Blue   HISTORY  Andrew [name], 52 year old male returns for followup. He is with his mother. He has a history of intractable seizure disorder since 51 years old. He has been followed in the office since 1990. 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   He has had EMU monitoring at [facility] in the past that included abnormal baseline EEG due to intermittent predominantly right temporal spike, sharp right,and focal slowing. He continues to refuse surgery to remove irritable focus.   Over the years, he has tried different combination, he has been on current medications   Topamax 100 mg twice a day, +50 mg twice a day, Dilantin 100 mg 2 tablets twice a day, Keppra 500 mg 3 tablets b.i.d, brand name    He is also taking Buspirone 10 mg t.i.d., citalopram 20 mg every day for his depression and angry control prescribed by psychiatry.   He continues to have 2-3 seizures every day, complex partial with secondary generalization, he had burst open few helmet due to his seizure, he continued to smoke marijuana and cigarette. He helps his mother taking care of his elderly bed ridden grandmother at home, has raging spells sometimes.    Vimpat 150 twice a day was added since 2015,    He still has seizure almost daily,   He is now taking Topamax 100 plus 50 mg twice a day , Dilantin 200 mg twice a day, Keppra 500 mg 3 tablets twice a day, Vimpat 150 mg twice a day   UPDATE February 07 2017: He came here urgently in early February 2018 because bilateral hands shaking, he was given the prescription of Vraylar, his shaking has much improved after stop the medication.   He is going through dental procedure, his mother had POA, they agreed to proceed with VNS   UPDATE June 09 2017: His seizure overall has much improved, is no longer having a daily basis, is  tolerating current medications, but yesterday on June 08 2017, he had few recurrent seizures, he is still going through dental procedures,   He is taking Keppra 500 mg 3 tablets, Vimpat 150 mg 2 tablets, topiramate 100 mg 2 tablets twice a day   UPDATE Dec 12 2017: His seizure overall has improved some, he also goes to [facility] psych[facility], taking buspiprone, which has helped his mood some,   Continue have frequent migraine headaches, 3-4 times each week, sometimes multiple episode in the day,   He smokes cigarettes some, mother has convinced him to stop use marijuana.   He continue have significant depression, after discussed with mother and patient, we decided to switch Keppra to lamotrigine,   We also talked about VNS placement, he wants to hold off    UPDATE March 20 2018: He is doing much better now with current combinations, lamotrigine 100 mg 2 tablets twice a day, Vimpat 150 mg 2 tablets twice a day, Topamax 100 mg 2 tablets twice a day   Only few recurrent seizure, much better than his previous baseline his mood has improved as well   UPDATE July 20 2018: He is doing much better now with current combination of Vimpat 150 mg 2 tablets twice a day, lamotrigine 100 mg 3 tablets twice a day, Topamax 100 mg twice a day, he only has seizure once or twice each week, they are short lasting, sometimes without recurrent  seizure in 1 week, this is in contrast to previous daily multiple seizure episode,   But he complains of dizziness, lightheadedness after each dose of medications,   His depression has much improved too, is more compliant with his mother   EEG in April 2019 showed generalized epileptiform discharge in the background of mild dysrhythmic slowing.   Update October 04, 2018 He is accompanied by his mother at today's visit, he presented to emergency room on September 24, 2018 after found falling down at home, he has no recollection of the event, mother thought he might  have a seizure, his seizure overall is under excellent control, tolerating lamotrigine XR 300 mg 2 tablets at nighttime, Vimpat 2 tablets twice a day, his mood is also under good control.   I personally reviewed MRI of brain: Advanced atrophy of the cerebellum, likely secondary to chronic antiepileptic medication MRI of cervical spine: Multilevel degenerative changes, moderate to severe foraminal narrowing at C3-4, C7-T1, secondary to multilevel uncovertebral disease, there is no evidence of canal stenosis.   Patient complains slow worsening gait abnormality over the past few months, urinary frequency, urgency, chronic low back pain, also mild neck pain radiating pain to right hand, arm   Virtual Visit via Phone March 14 2019: I interviewed patient and his mother, his seizure is under very good control, only had 3 seizure and few staring spells over past 4 months.   He continues to have gait abnormalities, using walker, right arm weakness, no incontinence.   EMG/NCS on Nov 10 2018: showed evidence of active right cervical radiculopathy, involving right C7,8, T1, moderate C6 myotomes.    He was seen by neurosurgeon, not a surgical candidate.    UPDATE July 12 2019: He is doing very well from a seizure standpoint with current combination lamotrigine ER 300 mg 2 tablets at bedtime, Vimpat 200 mg 2 tablets twice a day, he had minor staring spells about once a week, rarely has generalized seizures   Continue have significant right hand weakness, but not progressing, continue to have gait abnormality,   Mother reported few episode of sudden onset generalized weakness, could not walk, left arm against gravity, most recent episode was July 10, 2019, he has got up taking his morning medications, he noticed sudden onset weakness, he was able to call mother to check on it, episode lasted about few hours, gradually improved   Reviewed emergency room presentation on November 20 2018, similar episode on  September 24, 2018, during the spells, he was able to move his toes and fingers, but very slowly, laboratory evaluations December showed negative troponin, normal CMP, with glucose of 99, normal CBC,   UPDATE Jan 15 2020: He is overall doing very well, there was no recurrent generalized tonic-clonic seizure, but he has staring spells, transient unresponsiveness almost daily basis, he no longer has to wear his helmet, continue have right hand weakness, gait difficulty has improved   UPDATE February 18 2020: He was seen by neurosurgeon Dr. Kathyrn Sheriff, deemed to be a good candidate for vagal nerve stimulation, however, patient have a tendency to back-and-forth with this decision, all VNS related question was answered, despite polypharmacy large dose of Vimpat 400 mg twice a day, lamotrigine 600 mg daily, he continues to have frequent spells of "small seizures", staring into space, unresponsive for 1 minutes,  this is a significant compared to previous frequent drop attacks, he has to wear helmet in the past,   He continue have significant right more than left  hand weakness, gait abnormality, he denies significant sensory changes   He came in today for repeat electrodiagnostic study today, which showed 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  I will refer him to fluoroscopy guided lumbar puncture   I again personally reviewed MRIs in 2019, MRI of cervical spine/brain: No acute abnormality, advanced atrophy of cerebellum likely secondary to chronic antielliptic medication therapy, multilevel cervical degenerative changes, evidence of significant canal stenosis, moderate to severe at bilateral C4-5,  C5-6, C6-7, C7-T1 MRI of lumbar spine showed mild degenerative changes, no significant canal or foraminal narrowing.   UPDATE Sep 04 2020: His mother reported 3 episode of patient woke up in the morning feeling body weakness, slurred speech, but was able to communicate clearly his idea, lasting for hours, gradually resolved, there was no loss of consciousness, this is different from any of his spells in the past,   He has no generalized tonic-clonic seizure, taking Vimpat 200 mg 2 tablets twice a day, lamotrigine xr24 hours 300 mg 2 tablets every night,   UPDATE June 02 2021: He is accompanied by his mother at today's visit, overall doing very well, only he has couple staring off spells each week, tolerating lamotrigine xr 300 mg 2 tablets every night, Vimpat 200 mg 2 tablets twice a day,   Continue has right hand muscle atrophy, weakness, this started gradually since 2019, no significant progression, no sensory component, previous EMG findings suggestive of multifocal motor neuropathy, no treatable etiology found, had a short trial in June 2021, received home IVIG, but developed hematuria, hypertension, required hospital admission, 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 Staring spells few times a month; Few times a year, getting out of tub might get out of the tub, tense up, lay down on bed. On buspar not seeing psychiatry anymore, working on getting set up with [facility] to get a therapist, get into day program. Right hand atrophy, slight flexion in fingers, tremor. Feels right arm getting some weaker. Balance is getting better, not using walker. Remains on Vimpat and Lamictal.   REVIEW OF SYSTEMS: Out of a  complete 14 system review of symptoms, the patient complains only of the following symptoms, and all other reviewed systems are negative.  See HPI  ALLERGIES: Allergies  Allergen Reactions   Other     IVIG - hypertension     HOME MEDICATIONS: Outpatient Medications Prior to Visit  Medication Sig Dispense Refill   acetaminophen (TYLENOL) 325 MG tablet Take 325 mg by mouth every 6 (six) hours as needed for moderate pain.     amLODipine (NORVASC) 10 MG tablet Take 10 mg by mouth daily.     atorvastatin (LIPITOR) 10 MG tablet Take 10 mg by mouth daily.     busPIRone (BUSPAR) 15 MG tablet TAKE 1 TABLET(15 MG) BY MOUTH THREE TIMES DAILY 90 tablet 3   Cholecalciferol (VITAMIN D3) 50 MCG (2000 UT) capsule Take 2,000 Units by mouth daily.  docusate sodium (COLACE) 100 MG capsule Take 100 mg by mouth daily as needed for mild constipation.     fluticasone (FLONASE) 50 MCG/ACT nasal spray Place 1 spray into both nostrils daily as needed for allergies or rhinitis.     lacosamide (VIMPAT) 200 MG TABS tablet Take 2 tablets (400 mg total) by mouth 2 (two) times daily. 360 tablet 3   zinc gluconate 50 MG tablet Take 50 mg by mouth daily.     LamoTRIgine 300 MG TB24 24 hour tablet Take 2 tablets (600 mg total) by mouth at bedtime. 250 tablet 4   folic acid (FOLVITE) 1 MG tablet Take 1 mg by mouth daily.     No facility-administered medications prior to visit.    PAST MEDICAL HISTORY: Past Medical History:  Diagnosis Date   Anxiety    Depression    Seizures ([place])     PAST SURGICAL HISTORY: Past Surgical History:  Procedure Laterality Date   COLONOSCOPY WITH PROPOFOL N/A 07/09/2021   Procedure: COLONOSCOPY WITH PROPOFOL;  Surgeon: Jerene Bears, MD;  Location: [facility];  Service: Gastroenterology;  Laterality: N/A;   none     POLYPECTOMY  07/09/2021   Procedure: POLYPECTOMY;  Surgeon: Jerene Bears, MD;  Location: [facility];  Service: Gastroenterology;;    FAMILY HISTORY: Family  History  Problem Relation Age of Onset   Asthma Father     SOCIAL HISTORY: Social History   Socioeconomic History   Marital status: Single    Spouse name: Not on file   Number of children: 0   Years of education: 12   Highest education level: Not on file  Occupational History    Comment: Disabled  Tobacco Use   Smoking status: Former    Packs/day: 0.50    Years: 20.00    Pack years: 10.00    Types: Cigarettes   Smokeless tobacco: Never  Vaping Use   Vaping Use: Never used  Substance and Sexual Activity   Alcohol use: No    Alcohol/week: 0.0 standard drinks   Drug use: No    Comment: 11/08/16 - reports he has stopped smoking marijuana.   Sexual activity: Not on file  Other Topics Concern   Not on file  Social History Narrative   Patient lives at home with his mother Limmie Schoenberg ). Patient is disabled. Patient has high school education.   Left handed.   Caffeine- soda - pepsi four cans daily.   Patients father died of over dose.-40   Social Determinants of Health   Financial Resource Strain: Not on file  Food Insecurity: Not on file  Transportation Needs: Not on file  Physical Activity: Not on file  Stress: Not on file  Social Connections: Not on file  Intimate Partner Violence: Not on file   PHYSICAL EXAM  Vitals:   12/09/21 1455  BP: 133/89  Pulse: 66  Weight: 212 lb 8 oz (96.4 kg)  Height: 5\' 10"  (1.778 m)   Body mass index is 30.49 kg/m.  Generalized: Well developed, in no acute distress  Neurological examination  Mentation: Alert oriented to time, place, history taking. Follows all commands speech and language fluent Cranial nerve II-XII: Pupils were equal round reactive to light. Extraocular movements were full, visual field were full on confrontational test. Facial sensation and strength were normal.  Head turning and shoulder shrug  were normal and symmetric. Motor: Right hand atrophy noted, right arm smaller than left, mild right hand finger  open weakness Sensory:  Decreased soft touch sensation to the right side Coordination: Cerebellar testing reveals good finger-nose-finger and heel-to-shin bilaterally.  Gait and station: Has to push off from seated position to stand, gait is slightly wide-based, cautious Reflexes: Deep tendon reflexes are symmetric are absent   DIAGNOSTIC DATA (LABS, IMAGING, TESTING) - I reviewed patient records, labs, notes, testing and imaging myself where available.  Lab Results  Component Value Date   WBC 6.4 05/21/2021   HGB 15.2 05/21/2021   HCT 44.0 05/21/2021   MCV 83.1 05/21/2021   PLT 238.0 05/21/2021      Component Value Date/Time   NA 138 05/21/2021 1123   NA 139 09/04/2020 1205   K 3.9 05/21/2021 1123   CL 106 05/21/2021 1123   CO2 24 05/21/2021 1123   GLUCOSE 81 05/21/2021 1123   BUN 14 05/21/2021 1123   BUN 11 09/04/2020 1205   CREATININE 1.26 05/21/2021 1123   CALCIUM 9.7 05/21/2021 1123   PROT 7.7 05/21/2021 1123   PROT 7.8 09/04/2020 1205   ALBUMIN 4.4 05/21/2021 1123   ALBUMIN 4.7 09/04/2020 1205   AST 17 05/21/2021 1123   ALT 29 05/21/2021 1123   ALKPHOS 73 05/21/2021 1123   BILITOT 0.5 05/21/2021 1123   BILITOT 0.4 09/04/2020 1205   GFRNONAA 69 09/04/2020 1205   GFRAA 80 09/04/2020 1205   No results found for: CHOL, HDL, LDLCALC, LDLDIRECT, TRIG, CHOLHDL Lab Results  Component Value Date   HGBA1C 5.9 (H) 09/04/2020   Lab Results  Component Value Date   VITAMINB12 310 07/12/2019   Lab Results  Component Value Date   TSH 1.240 09/04/2020   ASSESSMENT AND PLAN 51 y.o. year old male  has a past medical history of Anxiety, Depression, and Seizures ([place]). 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 Epilepsy, intellectual disability  -Stable, continued staring spells few times a week, tolerates meds -Continue lamotrigine XR 300 mg, 2 tablets at bedtime -Continue Vimpat 200 mg, 2 tablets twice a day, should have 1 refill left, will verify with pharmacy and let me know when to send refills -Has previously seen Dr. Kathyrn Sheriff, determined candidate for VNS, the patient decided to hold off -Currently pleased with his seizure control  3.  Mood disorder -Stable on BuSpar 15 mg 3 times a day -Getting into [facility] health care team, day program  Butler Denmark, [place], [place] 12/09/2021, 3:38 PM [facility] [address], [place], [place] [address] [phone]

## 2021-12-10 ENCOUNTER — Other Ambulatory Visit: Payer: Self-pay | Admitting: Neurology

## 2021-12-10 NOTE — Telephone Encounter (Signed)
I spoke to patient's mother, Verdis Frederickson. Right now, she has been able to get all this medications - brand name Vimpat, lamotrigine TB24 and buspirone. If she has any issues with refills, she will call us back.  He has new insurance. His card is in [place]. [facility] Medicare plan - prescriptions through [facility] (HT#[id number]).

## 2021-12-10 NOTE — Telephone Encounter (Signed)
Pt's mother states she was told by the pharmacy that the doctor needs to contact the insurance [facility]) so that pt's medications are covered for year 2023.  Please call pt's mother

## 2021-12-10 NOTE — Telephone Encounter (Signed)
Attempted to call pt, LVM for call back  °

## 2021-12-23 MED ORDER — LACOSAMIDE 200 MG PO TABS: 400.0000 mg | ORAL_TABLET | Freq: Two times a day (BID) | ORAL | 1 refills | Status: DC

## 2021-12-23 NOTE — Telephone Encounter (Signed)
Pt's mother called stating that he is needing a refill for his Vimpat called in to his pharmacy.

## 2021-12-23 NOTE — Addendum Note (Signed)
Addended by: Noberto Retort C on: 12/23/2021 10:20 AM   Modules accepted: Orders

## 2021-12-29 DIAGNOSIS — E785 Hyperlipidemia, unspecified: Secondary | ICD-10-CM | POA: Insufficient documentation

## 2021-12-29 NOTE — Telephone Encounter (Signed)
Pt's wife called stating she received a letter regarding this medication and she has several questions. Please advise.

## 2021-12-30 MED ORDER — CLOBAZAM 10 MG PO TABS: 10.0000 mg | ORAL_TABLET | Freq: Every evening | ORAL | 5 refills | Status: DC

## 2021-12-30 MED ORDER — LACOSAMIDE 200 MG PO TABS: 200.0000 mg | ORAL_TABLET | Freq: Two times a day (BID) | ORAL | 1 refills | Status: DC

## 2021-12-30 NOTE — Addendum Note (Signed)
Addended by: Marcial Pacas on: 12/30/2021 02:18 PM   Modules accepted: Orders

## 2021-12-30 NOTE — Telephone Encounter (Signed)
Per vo by Dr. Krista Blue, his new plan will be as follows:  1) lamotrigine - same dosage  2) decrease name brand Vimpat 200mg , one tab BID  3) add clobazam 10mg , one tablet at bedtime. _____________________________________ His mother is agreeable to new plan. We reviewed in detail and she repeated it back to me correctly. _____________________________________ PA for clobazam started on covermymeds (key: [id number]). Decision pending.

## 2021-12-30 NOTE — Telephone Encounter (Addendum)
I called his mother back. 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   Confirmed he is taking the following: 1) lamotrigine TB24, 200mg , 2 tabs QHS 2) name brand Vimpat 200mg , 2 tabs BID  Since insurance will only cover Vimpat 200mg , one tab BID. New plan will need to be discussed with Dr. Krista Blue.

## 2021-12-30 NOTE — Addendum Note (Signed)
Addended by: Noberto Retort C on: 12/30/2021 03:02 PM   Modules accepted: Orders

## 2021-12-30 NOTE — Telephone Encounter (Signed)
PA approved for brand name Vimpat 200mg  but only two per day. His plan will not cover four tablets daily. The request did not meet the quantity limit requirement.   Urgent appeal offered over the phone at [phone]. Attempted to complete case for quantity override with [place] Dept.  Per plan, the quantity limit is over the max dose allowable dosing guideline and cannot be overturned.   Dr. Krista Blue would like [place] to seek out patient assistance options. I called [employer] at [phone] (spoke to rep, Jae Dire). The Vimpat program for 2023 has been terminated.   She recommended contacting Prescription Support Services with [employer] at [phone]. They were unable to offer further resources.

## 2021-12-30 NOTE — Telephone Encounter (Signed)
Eoin is not married. His mother, Lelan Pons, called our office. They received a letter that his brand name VImpat needs PA. Also, requires a quantity override  PA/Quantity case started on covermymeds (key: [id number]). Pt has coverage through [employer] [phone]). Decision pending.

## 2021-12-30 NOTE — Telephone Encounter (Signed)
[id number] approved through 11/21/2022.  Also, called [facility] at [phone]. Left detailed message on the provider line with this updated treatment regimen.

## 2022-01-08 DIAGNOSIS — R748 Abnormal levels of other serum enzymes: Secondary | ICD-10-CM | POA: Insufficient documentation

## 2022-01-08 DIAGNOSIS — R8271 Bacteriuria: Secondary | ICD-10-CM | POA: Insufficient documentation

## 2022-01-11 ENCOUNTER — Telehealth: Payer: Self-pay | Admitting: Neurology

## 2022-01-11 NOTE — Telephone Encounter (Signed)
Pt's mother is asking for a call from [place], [place] to discuss pt's:LamoTRIgine 300 MG TB24 24 hour tablet and the difficulty she is having with the insurance not being willing to cover the entire refill, please call.

## 2022-01-11 NOTE — Telephone Encounter (Signed)
PA started on covermymeds (key: [id number]). Pt has pharmacy coverage through [employer] ([place]#[id number]). [id number] approved through 11/21/2022. Pharmacy and patient's mother notified.

## 2022-01-11 NOTE — Telephone Encounter (Signed)
Per pharmacy, extended release lamotrigine 300mg  needs a quantity override case for two tablets QHS.

## 2022-02-17 ENCOUNTER — Ambulatory Visit (HOSPITAL_COMMUNITY)
Admission: EM | Admit: 2022-02-17 | Discharge: 2022-02-17 | Disposition: A | Discharge status: Home / Self Care | Payer: Medicare Other

## 2022-02-17 ENCOUNTER — Encounter (HOSPITAL_COMMUNITY): Payer: Self-pay

## 2022-02-17 DIAGNOSIS — J069 Acute upper respiratory infection, unspecified: Secondary | ICD-10-CM

## 2022-02-17 NOTE — ED Triage Notes (Signed)
Pt presents today with nasal congestion that started last Friday. ?

## 2022-02-17 NOTE — Discharge Instructions (Signed)
You have a viral infection that will continue to improve.  If you have difficulty breathing, chest pain, you are vomiting and can't keep any liquids down and you aren't urinating at least 50% of your normal amount, you should be seen at the emergency room right away.  If you aren't improving over the next week, please follow up with your regular medical provider. ? ?For your congestion, you can use nasal saline spray.  You can also use a humidifier.  You can also use honey as needed for cough by the spoonful or in a warm liquid (do not give honey to an infant less than a year old).  ?

## 2022-02-17 NOTE — ED Provider Notes (Signed)
?[facility] ? ? ? ?CSN: [id number] ?Arrival date & time: 02/17/22  8756 ? ? ?  ? ?History   ?Chief Complaint ?Chief Complaint  ?Patient presents with  ? Nasal Congestion  ? ? ?HPI ?Andrew [name] is a 51 y.o. male.  ? ?Cough ?Started 5 days ago ?No fevers  ?Overall fells like he is improving ?Still having some congestion and dry cough ?Denies rhinorrhea, sore throat, headache, fatigue, myalgias, nausea, vomiting, diarrhea, chest pain, shortness of breath ?Has been drinking normally, has normal UOP  ?No known sick contacts, but mother present who is now also sick for last 3 days ?Has not been tested for COVID  ? ? ? ?Past Medical History:  ?Diagnosis Date  ? Anxiety   ? Depression   ? Seizures ([place])   ? ? ?Patient Active Problem List  ? Diagnosis Date Noted  ? Occult blood positive stool   ? Benign neoplasm of sigmoid colon   ? Benign neoplasm of rectum   ? Intellectual delay 06/02/2021  ? 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 Neuropathy 02/18/2020  ? Essential (primary) hypertension 01/24/2020  ? Body mass index (BMI) 32.0-32.9, adult 01/24/2020  ? Weakness 07/12/2019  ? Weakness of hand 10/30/2018  ? Cervical stenosis of spinal canal 10/04/2018  ? Gait abnormality 10/04/2018  ? Partial symptomatic epilepsy with complex partial seizures, not intractable, without status epilepticus ([place]) 12/24/2016  ? Bipolar disorder ([place]) 12/24/2016  ? Seizures ([place])   ? Anxiety   ? ? ?Past Surgical History:  ?Procedure Laterality Date  ? COLONOSCOPY WITH PROPOFOL N/A 07/09/2021  ? Procedure: COLONOSCOPY WITH PROPOFOL;  Surgeon: Jerene Bears, MD;  Location: [facility];  Service: Gastroenterology;  Laterality: N/A;  ? none    ? POLYPECTOMY  07/09/2021  ? Procedure: POLYPECTOMY;  Surgeon: Jerene Bears, MD;  Location: [facility];  Service: Gastroenterology;;  ? ? ? ? ? ?Home Medications   ? ?Prior to Admission medications   ?Medication  Sig Start Date End Date Taking? Authorizing Provider  ?losartan (COZAAR) 25 MG tablet Take 25 mg by mouth daily.   Yes [provider]  ?acetaminophen (TYLENOL) 325 MG tablet Take 325 mg by mouth every 6 (six) hours as needed for moderate pain.    [provider]  ?amLODipine (NORVASC) 10 MG tablet Take 10 mg by mouth daily.    [provider]  ?atorvastatin (LIPITOR) 10 MG tablet Take 10 mg by mouth daily. 11/12/21   [provider]  ?busPIRone (BUSPAR) 15 MG tablet TAKE 1 TABLET(15 MG) BY MOUTH THREE TIMES DAILY 11/09/21   Marcial Pacas, MD  ?Cholecalciferol (VITAMIN D3) 50 MCG (2000 UT) capsule Take 2,000 Units by mouth daily.    [provider]  ?cloBAZam (ONFI) 10 MG tablet Take 1 tablet (10 mg total) by mouth at bedtime. 12/30/21   Marcial Pacas, MD  ?docusate sodium (COLACE) 100 MG capsule Take 100 mg by mouth daily as needed for mild constipation.    [provider]  ?fluticasone (FLONASE) 50 MCG/ACT nasal spray Place 1 spray into both nostrils daily as needed for allergies or rhinitis.    [provider]  ?lacosamide (VIMPAT) 200 MG TABS tablet Take 1 tablet (200 mg total) by mouth 2 (two) times daily. 12/30/21   Marcial Pacas, MD  ?LamoTRIgine 300 MG TB24 24 hour tablet Take 2 tablets (600 mg total) by mouth at bedtime.  12/09/21   Suzzanne Cloud, NP  ?zinc gluconate 50 MG tablet Take 50 mg by mouth daily.    [provider]  ? ? ?Family History ?Family History  ?Problem Relation Age of Onset  ? Asthma Father   ? ? ?Social History ?Social History  ? ?Tobacco Use  ? Smoking status: Former  ?  Packs/day: 0.50  ?  Years: 20.00  ?  Pack years: 10.00  ?  Types: Cigarettes  ? Smokeless tobacco: Never  ?Vaping Use  ? Vaping Use: Never used  ?Substance Use Topics  ? Alcohol use: No  ?  Alcohol/week: 0.0 standard drinks  ? Drug use: No  ?  Comment: 11/08/16 - reports he has stopped smoking marijuana.  ? ? ? ?Allergies   ?Other ? ? ?Review of Systems ?Review  of Systems  ?All other systems reviewed and are negative. ? ? ?Physical Exam ?Triage Vital Signs ?ED Triage Vitals  ?Enc Vitals Group  ?   BP   ?   Pulse   ?   Resp   ?   Temp   ?   Temp src   ?   SpO2   ?   Weight   ?   Height   ?   Head Circumference   ?   Peak Flow   ?   Pain Score   ?   Pain Loc   ?   Pain Edu?   ?   Excl. in [place]?   ? ?No data found. ? ?Updated Vital Signs ?BP 117/81 (BP Location: Right Arm)   Pulse 72   Temp 98.3 ?F (36.8 ?C) (Oral)   Resp 18   SpO2 100%  ? ?Visual Acuity ?Right Eye Distance:   ?Left Eye Distance:   ?Bilateral Distance:   ? ?Right Eye Near:   ?Left Eye Near:    ?Bilateral Near:    ? ?Physical Exam ?Constitutional:   ?   General: He is not in acute distress. ?   Appearance: He is well-developed. He is not ill-appearing.  ?HENT:  ?   Head: Normocephalic and atraumatic.  ?   Right Ear: Tympanic membrane, ear canal and external ear normal.  ?   Left Ear: Tympanic membrane, ear canal and external ear normal.  ?   Nose: Congestion and rhinorrhea present.  ?   Mouth/Throat:  ?   Mouth: Mucous membranes are moist.  ?   Pharynx: Uvula midline. No oropharyngeal exudate or posterior oropharyngeal erythema.  ?   Tonsils: No tonsillar exudate or tonsillar abscesses.  ?Eyes:  ?   Conjunctiva/sclera: Conjunctivae normal.  ?Cardiovascular:  ?   Rate and Rhythm: Normal rate and regular rhythm.  ?Pulmonary:  ?   Effort: Pulmonary effort is normal. No respiratory distress.  ?   Breath sounds: Normal breath sounds. No wheezing, rhonchi or rales.  ?Musculoskeletal:  ?   Cervical back: Normal range of motion and neck supple.  ?Lymphadenopathy:  ?   Cervical: No cervical adenopathy.  ?Skin: ?   General: Skin is warm and dry.  ?   Capillary Refill: Capillary refill takes less than 2 seconds.  ?Neurological:  ?   Mental Status: He is alert.  ? ? ? ?UC Treatments / Results  ?Labs ?(all labs ordered are listed, but only abnormal results are displayed) ?Labs Reviewed - No data to  display ? ?EKG ? ? ?Radiology ?No results found. ? ?Procedures ?Procedures (including critical care time) ? ?Medications Ordered in  UC ?Medications - No data to display ? ?Initial Impression / Assessment and Plan / UC Course  ?I have reviewed the triage vital signs and the nursing notes. ? ?Pertinent labs & imaging results that were available during my care of the patient were reviewed by me and considered in my medical decision making (see chart for details). ? ?  ? ?VSS.  Improving.  Given improvement and onset since symptoms, no indication for testing.  Likely nearly resolved viral URI. Advised of OTC treatments and ED precautions, see AVS.   ?Final Clinical Impressions(s) / UC Diagnoses  ? ?Final diagnoses:  ?Viral URI with cough  ? ? ? ?Discharge Instructions   ? ?  ?You have a viral infection that will continue to improve.  If you have difficulty breathing, chest pain, you are vomiting and can't keep any liquids down and you aren't urinating at least 50% of your normal amount, you should be seen at the emergency room right away.  If you aren't improving over the next week, please follow up with your regular medical provider. ? ?For your congestion, you can use nasal saline spray.  You can also use a humidifier.  You can also use honey as needed for cough by the spoonful or in a warm liquid (do not give honey to an infant less than a year old).  ? ? ? ? ?ED Prescriptions   ?None ?  ? ?PDMP not reviewed this encounter. ?  ?Cleophas Dunker, DO ?02/17/22 1110 ? ?

## 2022-02-24 ENCOUNTER — Other Ambulatory Visit: Payer: Self-pay | Admitting: Neurology

## 2022-02-24 NOTE — Telephone Encounter (Signed)
Rx refilled.

## 2022-05-12 ENCOUNTER — Telehealth: Payer: Self-pay | Admitting: Neurology

## 2022-05-12 DIAGNOSIS — G40209 Localization-related (focal) (partial) symptomatic epilepsy and epileptic syndromes with complex partial seizures, not intractable, without status epilepticus: Secondary | ICD-10-CM

## 2022-05-12 NOTE — Telephone Encounter (Signed)
Pt's mother, Rayon Mcchristian (on [place]) called PCP today to let him know what has been happening since prescribed Dutasteride 0.5 mg and Tamsulosin on  04/02/22. Pt started having seizures 05/02/22 and lost his balance, 05/03/22 has seizure and lost his balance. Had a seizure yesterday and this morning. Before getting medication from PCP would not have any seizure. PCP recommended to call his neurologist.  Would like a call from the nurse.

## 2022-05-13 NOTE — Telephone Encounter (Signed)
I spoke to his mother about his medication. He is taking them as prescribed. She had an appt and needed me to call back with the plan and leave it on her voicemail.  I called her number back and left message. She should bring him into the office for labs (provided our office hours). Also, she will be getting a call to get his EEG scheduled.

## 2022-05-13 NOTE — Addendum Note (Signed)
Addended by: Suzzanne Cloud on: 05/13/2022 02:19 PM   Modules accepted: Orders

## 2022-05-13 NOTE — Telephone Encounter (Addendum)
I spoke to the patient's mother. Reports absence seizures on the following dates: 05/02/22, 05/03/22, 05/11/22, 05/12/22 x 2, 05/13/22. Each event only last a few seconds. Resulted in a couple of falls without injury. Returned to baseline quickly.  Confirmed active seizure meds:  1) Onfi '10mg'$ , one tab QHS 2) lacosamide '200mg'$ , one tab BID 3) lamotrigine XR '300mg'$ , two tabs QHS  Denies missing any doses. Not feeling ill.  Started new meds one month ago from PCP (tamsulosin, dutasteride). She was worried they may be the cause.  PCP did not think they were an issue. Instructed her to call our office. She has held these medications anyway, as a precaution, until we address his breakthrough seizure activity.

## 2022-05-13 NOTE — Telephone Encounter (Signed)
I have his seizure medications are as follows: Onfi 10 mg at bedtime Vimpat 200 mg twice a day Lamictal XR 300 mg, 2 tablets at bedtime  I will check EEG.  Not sure that this sounds at different from his baseline.  Potentially dutasteride and tamsulosin are making him unsteady or dizzy, if his mother thinks related to increased seizure activity okay to hold. I would continue his current seizure medications listed above.  We can go ahead and update labs, check CBC, CMP, Lamictal, Vimpat level. He can wait until he comes for EEG.  Orders Placed This Encounter  Procedures   CBC with Differential/Platelet   CMP   Lamotrigine level   Lacosamide   EEG adult

## 2022-05-17 ENCOUNTER — Other Ambulatory Visit: Payer: Medicare Other

## 2022-05-17 ENCOUNTER — Telehealth: Payer: Self-pay | Admitting: Neurology

## 2022-05-17 DIAGNOSIS — Z0289 Encounter for other administrative examinations: Secondary | ICD-10-CM

## 2022-05-17 DIAGNOSIS — G40209 Localization-related (focal) (partial) symptomatic epilepsy and epileptic syndromes with complex partial seizures, not intractable, without status epilepticus: Secondary | ICD-10-CM

## 2022-05-17 NOTE — Telephone Encounter (Signed)
Labs collected today. EEG scheduled for 05/19/22.   I spoke his mother who said she received a voicemail confirming his EEG.

## 2022-05-19 ENCOUNTER — Telehealth: Payer: Self-pay | Admitting: *Deleted

## 2022-05-19 ENCOUNTER — Ambulatory Visit (INDEPENDENT_AMBULATORY_CARE_PROVIDER_SITE_OTHER): Payer: Medicare Other | Admitting: Neurology

## 2022-05-19 DIAGNOSIS — G40209 Localization-related (focal) (partial) symptomatic epilepsy and epileptic syndromes with complex partial seizures, not intractable, without status epilepticus: Secondary | ICD-10-CM | POA: Diagnosis not present

## 2022-05-19 LAB — LACOSAMIDE: Lacosamide: 10.5 ug/mL — ABNORMAL HIGH (ref 5.0–10.0)

## 2022-05-19 LAB — COMPREHENSIVE METABOLIC PANEL
ALT: 26 IU/L (ref 0–44)
AST: 15 IU/L (ref 0–40)
Albumin/Globulin Ratio: 1.4 (ref 1.2–2.2)
Albumin: 4.3 g/dL (ref 4.0–5.0)
Alkaline Phosphatase: 96 IU/L (ref 44–121)
BUN/Creatinine Ratio: 10 (ref 9–20)
BUN: 11 mg/dL (ref 6–24)
Bilirubin Total: 0.3 mg/dL (ref 0.0–1.2)
CO2: 22 mmol/L (ref 20–29)
Calcium: 9.5 mg/dL (ref 8.7–10.2)
Chloride: 105 mmol/L (ref 96–106)
Creatinine, Ser: 1.09 mg/dL (ref 0.76–1.27)
Globulin, Total: 3 g/dL (ref 1.5–4.5)
Glucose: 96 mg/dL (ref 70–99)
Potassium: 4 mmol/L (ref 3.5–5.2)
Sodium: 142 mmol/L (ref 134–144)
Total Protein: 7.3 g/dL (ref 6.0–8.5)
eGFR: 83 mL/min/{1.73_m2} (ref >59)

## 2022-05-19 LAB — CBC WITH DIFFERENTIAL/PLATELET
Basophils Absolute: 0.1 10*3/uL (ref 0.0–0.2)
Basos: 1 %
EOS (ABSOLUTE): 0.4 10*3/uL (ref 0.0–0.4)
Eos: 7 %
Hematocrit: 43.6 % (ref 37.5–51.0)
Hemoglobin: 14.7 g/dL (ref 13.0–17.7)
Immature Grans (Abs): 0 10*3/uL (ref 0.0–0.1)
Immature Granulocytes: 0 %
Lymphocytes Absolute: 2.6 10*3/uL (ref 0.7–3.1)
Lymphs: 40 %
MCH: 28.3 pg (ref 26.6–33.0)
MCHC: 33.7 g/dL (ref 31.5–35.7)
MCV: 84 fL (ref 79–97)
Monocytes Absolute: 0.5 10*3/uL (ref 0.1–0.9)
Monocytes: 8 %
Neutrophils Absolute: 2.9 10*3/uL (ref 1.4–7.0)
Neutrophils: 44 %
Platelets: 233 10*3/uL (ref 150–450)
RBC: 5.19 x10E6/uL (ref 4.14–5.80)
RDW: 13.5 % (ref 11.6–15.4)
WBC: 6.6 10*3/uL (ref 3.4–10.8)

## 2022-05-19 LAB — LAMOTRIGINE LEVEL: Lamotrigine Lvl: 10.3 ug/mL (ref 2.0–20.0)

## 2022-05-19 NOTE — Telephone Encounter (Signed)
I spoke with the patient's mother, Lelan Pons (as per [facility]). I informed her of the lab results. She confirmed the patient will come in for his EEG today. She expressed appreciation and verbalized understanding of the call.

## 2022-05-19 NOTE — Telephone Encounter (Signed)
-----   Message from Suzzanne Cloud, NP sent at 05/19/2022  8:17 AM EDT ----- Blood work is unrevealing.  Good levels of Vimpat and Lamictal.  CBC and CMP are normal, no signs of infection or electrolyte abnormality.  Scheduled for EEG today.  For now, continue current medications.

## 2022-06-01 ENCOUNTER — Telehealth: Payer: Self-pay | Admitting: Neurology

## 2022-06-01 NOTE — Telephone Encounter (Signed)
I spoke to the patient's mother and provided her with his EEG results.

## 2022-06-01 NOTE — Telephone Encounter (Signed)
Please call patient and his mother, EEG showed no significant abnormality,

## 2022-06-01 NOTE — Telephone Encounter (Signed)
Pt's mother, Kate Sweetman checking on results for EEG.

## 2022-06-01 NOTE — Procedures (Signed)
   HISTORY: 51 years old male with history of epilepsy, intellectual delay.  TECHNIQUE:  This is a routine 16 channel EEG recording with one channel devoted to a limited EKG recording.  It was performed during wakefulness, drowsiness and asleep.  Hyperventilation and photic stimulation were performed as activating procedures.  There are minimum muscle and movement artifact noted.  Upon maximum arousal, posterior dominant waking rhythm consistent of very small amplitude rhythmic alpha range activity. Activities are symmetric over the bilateral posterior derivations and attenuated with eye opening.  Hyperventilation produced mild/moderate buildup with higher amplitude and the slower activities noted.  Photic stimulation did not alter the tracing.  During EEG recording, patient developed drowsiness and no deeper stage of sleep was achieved During EEG recording, there was no epileptiform discharge noted.  EKG demonstrate sinus rhythm, with heart rate of 60 bpm  CONCLUSION: This is a  normal awake EEG.  There is no electrodiagnostic evidence of epileptiform discharge.  Marcial Pacas, M.D. Ph.D.  [facility] [place],  [address] Phone: [phone] Fax:      [phone]

## 2022-06-15 ENCOUNTER — Ambulatory Visit (INDEPENDENT_AMBULATORY_CARE_PROVIDER_SITE_OTHER): Payer: Medicare Other | Admitting: Neurology

## 2022-06-15 ENCOUNTER — Encounter: Payer: Self-pay | Admitting: Neurology

## 2022-06-15 VITALS — BP 125/79 | HR 73 | Ht 70.0 in | Wt 227.5 lb

## 2022-06-15 DIAGNOSIS — G40209 Localization-related (focal) (partial) symptomatic epilepsy and epileptic syndromes with complex partial seizures, not intractable, without status epilepticus: Secondary | ICD-10-CM

## 2022-06-15 DIAGNOSIS — R569 Unspecified convulsions: Secondary | ICD-10-CM

## 2022-06-15 MED ORDER — CLOBAZAM 10 MG PO TABS: 10.0000 mg | ORAL_TABLET | Freq: Every evening | ORAL | 3 refills | Status: DC

## 2022-06-15 NOTE — Progress Notes (Signed)
HISTORY  Mr. Ashmead, 51 year old male returns for followup. He is with his mother. He has a history of intractable seizure disorder since 51 years old. He has been followed in the office since 1990. 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   He has had EMU monitoring at [facility] in the past that included abnormal baseline EEG due to intermittent predominantly right temporal spike, sharp right,and focal slowing. He continues to refuse surgery to remove irritable focus.   Over the years, he has tried different combination, he has been on current medications   Topamax 100 mg twice a day, +50 mg twice a day, Dilantin 100 mg 2 tablets twice a day, Keppra 500 mg 3 tablets b.i.d, brand name    He is also taking Buspirone 10 mg t.i.d., citalopram 20 mg every day for his depression and angry control prescribed by psychiatry.   He continues to have 2-3 seizures every day, complex partial with secondary generalization, he had burst open few helmet due to his seizure, he continued to smoke marijuana and cigarette. He helps his mother taking care of his elderly bed ridden grandmother at home, has raging spells sometimes.    Vimpat 150 twice a day was added since 2015,    He still has seizure almost daily,   He is now taking Topamax 100 plus 50 mg twice a day , Dilantin 200 mg twice a day, Keppra 500 mg 3 tablets twice a day, Vimpat 150 mg twice a day   UPDATE February 07 2017: He came here urgently in early February 2018 because bilateral hands shaking, he was given the prescription of Vraylar, his shaking has much improved after stop the medication.   He is going through dental procedure, his mother had POA, they agreed to proceed with VNS   UPDATE June 09 2017: His seizure overall has much improved, is no longer having a daily basis, is tolerating current medications, but yesterday on June 08 2017, he had few recurrent seizures, he is still going through dental procedures,   He is  taking Keppra 500 mg 3 tablets, Vimpat 150 mg 2 tablets, topiramate 100 mg 2 tablets twice a day   UPDATE Dec 12 2017: His seizure overall has improved some, he also goes to [facility], taking buspiprone, which has helped his mood some,   Continue have frequent migraine headaches, 3-4 times each week, sometimes multiple episode in the day,   He smokes cigarettes some, mother has convinced him to stop use marijuana.   He continue have significant depression, after discussed with mother and patient, we decided to switch Keppra to lamotrigine,   We also talked about VNS placement, he wants to hold off    UPDATE March 20 2018: He is doing much better now with current combinations, lamotrigine 100 mg 2 tablets twice a day, Vimpat 150 mg 2 tablets twice a day, Topamax 100 mg 2 tablets twice a day   Only few recurrent seizure, much better than his previous baseline his mood has improved as well   UPDATE July 20 2018: He is doing much better now with current combination of Vimpat 150 mg 2 tablets twice a day, lamotrigine 100 mg 3 tablets twice a day, Topamax 100 mg twice a day, he only has seizure once or twice each week, they are short lasting, sometimes without recurrent seizure in 1 week, this is in contrast to previous daily multiple seizure episode,   But he complains of dizziness, lightheadedness after  each dose of medications,   His depression has much improved too, is more compliant with his mother   EEG in April 2019 showed generalized epileptiform discharge in the background of mild dysrhythmic slowing.   Update October 04, 2018 He is accompanied by his mother at today's visit, he presented to emergency room on September 24, 2018 after found falling down at home, he has no recollection of the event, mother thought he might have a seizure, his seizure overall is under excellent control, tolerating lamotrigine XR 300 mg 2 tablets at nighttime, Vimpat 2 tablets twice a day,  his mood is also under good control.   I personally reviewed MRI of brain: Advanced atrophy of the cerebellum, likely secondary to chronic antiepileptic medication MRI of cervical spine: Multilevel degenerative changes, moderate to severe foraminal narrowing at C3-4, C7-T1, secondary to multilevel uncovertebral disease, there is no evidence of canal stenosis.   Patient complains slow worsening gait abnormality over the past few months, urinary frequency, urgency, chronic low back pain, also mild neck pain radiating pain to right hand, arm   Virtual Visit via Phone March 14 2019: I interviewed patient and his mother, his seizure is under very good control, only had 3 seizure and few staring spells over past 4 months.   He continues to have gait abnormalities, using walker, right arm weakness, no incontinence.   EMG/NCS on Nov 10 2018: showed evidence of active right cervical radiculopathy, involving right C7,8, T1, moderate C6 myotomes.    He was seen by neurosurgeon, not a surgical candidate.    UPDATE July 12 2019: He is doing very well from a seizure standpoint with current combination lamotrigine ER 300 mg 2 tablets at bedtime, Vimpat 200 mg 2 tablets twice a day, he had minor staring spells about once a week, rarely has generalized seizures   Continue have significant right hand weakness, but not progressing, continue to have gait abnormality,   Mother reported few episode of sudden onset generalized weakness, could not walk, left arm against gravity, most recent episode was July 10, 2019, he has got up taking his morning medications, he noticed sudden onset weakness, he was able to call mother to check on it, episode lasted about few hours, gradually improved   Reviewed emergency room presentation on November 20 2018, similar episode on September 24, 2018, during the spells, he was able to move his toes and fingers, but very slowly, laboratory evaluations December showed negative  troponin, normal CMP, with glucose of 99, normal CBC,   UPDATE Jan 15 2020: He is overall doing very well, there was no recurrent generalized tonic-clonic seizure, but he has staring spells, transient unresponsiveness almost daily basis, he no longer has to wear his helmet, continue have right hand weakness, gait difficulty has improved   UPDATE February 18 2020: He was seen by neurosurgeon Dr. Kathyrn Sheriff, deemed to be a good candidate for vagal nerve stimulation, however, patient have a tendency to back-and-forth with this decision, all VNS related question was answered, despite polypharmacy large dose of Vimpat 400 mg twice a day, lamotrigine 600 mg daily, he continues to have frequent spells of "small seizures", staring into space, unresponsive for 1 minutes,  this is a significant compared to previous frequent drop attacks, he has to wear helmet in the past,   He continue have significant right more than left hand weakness, gait abnormality, he denies significant sensory changes   He came in today for repeat electrodiagnostic study today, which showed 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  I will refer him to fluoroscopy guided lumbar puncture   I again personally reviewed MRIs in 2019, MRI of cervical spine/brain: No acute abnormality, advanced atrophy of cerebellum likely secondary to chronic antielliptic medication therapy, multilevel cervical degenerative changes, evidence of significant canal stenosis, moderate to severe at bilateral C4-5, C5-6, C6-7, C7-T1 MRI of lumbar spine showed mild degenerative changes, no significant canal or foraminal narrowing.   UPDATE Sep 04 2020: His  mother reported 3 episode of patient woke up in the morning feeling body weakness, slurred speech, but was able to communicate clearly his idea, lasting for hours, gradually resolved, there was no loss of consciousness, this is different from any of his spells in the past,   He has no generalized tonic-clonic seizure, taking Vimpat 200 mg 2 tablets twice a day, lamotrigine xr24 hours 300 mg 2 tablets every night,   UPDATE June 02 2021: He is accompanied by his mother at today's visit, overall doing very well, only he has couple staring off spells each week, tolerating lamotrigine xr 300 mg 2 tablets every night, Vimpat 200 mg 2 tablets twice a day,   Continue has right hand muscle atrophy, weakness, this started gradually since 2019, no significant progression, no sensory component, 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 Remains on Vimpat and Lamictal.   UPDATE July 25th 2023 Overall very stable with his current medications, rarely has small seizure, short lasting, continue have right hand atrophy, gait has improved,  Repeat EEG in June 2023 was normal  Currently taking clobazam 10 mg every night, Vimpat 200 mg twice a day, lamotrigine ER 300 mg 2 tablets every night level was 10.3, lacosamide level was 10.5, normal CMP CBC,  REVIEW OF SYSTEMS: Out of a complete 14 system review of symptoms, the patient complains only of the following symptoms, and all other reviewed systems are negative.  See  HPI  ALLERGIES: Allergies  Allergen Reactions   Other     IVIG - hypertension     HOME MEDICATIONS: Outpatient Medications Prior to Visit  Medication Sig Dispense Refill   acetaminophen (TYLENOL) 325 MG tablet Take 325 mg by mouth every 6 (six) hours as needed for moderate pain.     amLODipine (NORVASC) 10 MG tablet Take 10 mg by mouth daily.     atorvastatin (LIPITOR) 10 MG tablet Take 10 mg by mouth daily.     busPIRone (BUSPAR) 15 MG tablet TAKE 1 TABLET(15 MG) BY MOUTH THREE TIMES DAILY 90 tablet 3   Cholecalciferol (VITAMIN D3) 50 MCG (2000 UT) capsule Take 2,000 Units by mouth daily.     cloBAZam (ONFI) 10 MG tablet Take 1 tablet (10 mg total) by mouth at bedtime. 30 tablet 5   Cyanocobalamin (VITAMIN B-12 PO) Take 1 tablet  by mouth daily.     docusate sodium (COLACE) 100 MG capsule Take 100 mg by mouth daily as needed for mild constipation.     dutasteride (AVODART) 0.5 MG capsule Take 0.5 mg by mouth every morning.     fluticasone (FLONASE) 50 MCG/ACT nasal spray Place 1 spray into both nostrils daily as needed for allergies or rhinitis.     lacosamide (VIMPAT) 200 MG TABS tablet Take 1 tablet (200 mg total) by mouth 2 (two) times daily. 180 tablet 1   LamoTRIgine 300 MG TB24 24 hour tablet Take 2 tablets (600 mg total) by mouth at bedtime. 180 tablet 4   losartan (COZAAR) 25 MG tablet Take 25 mg by mouth daily.     tamsulosin (FLOMAX) 0.4 MG CAPS capsule Take 0.4 mg by mouth daily.     UNABLE TO FIND Take 1 tablet by mouth daily. Med Name: [place] + Vitamin C     zinc gluconate 50 MG tablet Take 50 mg by mouth daily.     No facility-administered medications prior to visit.    PAST MEDICAL HISTORY: Past Medical History:  Diagnosis Date   Anxiety    Depression    Seizures ([place])     PAST SURGICAL HISTORY: Past Surgical History:  Procedure Laterality Date   COLONOSCOPY WITH PROPOFOL N/A 07/09/2021   Procedure: COLONOSCOPY WITH PROPOFOL;  Surgeon: Jerene Bears, MD;   Location: [facility];  Service: Gastroenterology;  Laterality: N/A;   none     POLYPECTOMY  07/09/2021   Procedure: POLYPECTOMY;  Surgeon: Jerene Bears, MD;  Location: [facility];  Service: Gastroenterology;;    FAMILY HISTORY: Family History  Problem Relation Age of Onset   Asthma Father     SOCIAL HISTORY: Social History   Socioeconomic History   Marital status: Single    Spouse name: Not on file   Number of children: 0   Years of education: 12   Highest education level: Not on file  Occupational History    Comment: Disabled  Tobacco Use   Smoking status: Former    Packs/day: 0.50    Years: 20.00    Total pack years: 10.00    Types: Cigarettes   Smokeless tobacco: Never  Vaping Use   Vaping Use: Never used  Substance and Sexual Activity   Alcohol use: No    Alcohol/week: 0.0 standard drinks of alcohol   Drug use: No    Comment: 11/08/16 - reports he has stopped smoking marijuana.   Sexual activity: Not on file  Other Topics Concern   Not on file  Social History Narrative   Patient lives at home with his mother Devonte Migues ). Patient is disabled. Patient has high school education.   Left handed.   Caffeine- soda - pepsi four cans daily.   Patients father died of over dose.-40   Social Determinants of Health   Financial Resource Strain: Not on file  Food Insecurity: Not on file  Transportation Needs: Not on file  Physical Activity: Not on file  Stress: Not on file  Social Connections: Not on file  Intimate Partner Violence: Not on file   PHYSICAL EXAM  Vitals:   06/15/22 1437  BP: 125/79  Pulse: 73  Weight: 227 lb 8 oz (103.2 kg)  Height: '5\' 10"'$  (1.778 m)   Body mass index is 32.64 kg/m.  Generalized:endentured, mild obesity Neurological examination  Mentation: Alert oriented to time, place, history taking. Follows all commands speech and language fluent Cranial  nerve II-XII: Pupils were equal round reactive to light. Extraocular movements  were full, visual field were full on confrontational test. Facial sensation and strength were normal.  Head turning and shoulder shrug  were normal and symmetric. Motor: Significant right hand atrophy, right wrist flexion 3, extension 4, finger abduction 3 mild left finger abduction grip weakness Sensory: Decreased vibratory sensation at bilateral lower extremity Coordination: Cerebellar testing reveals good finger-nose-finger and heel-to-shin bilaterally.  Gait and station: Needs push-up to get up from sitting position, cautious wide-based, Reflexes: Areflexia  DIAGNOSTIC DATA (LABS, IMAGING, TESTING) - I reviewed patient records, labs, notes, testing and imaging myself where available.  Lab Results  Component Value Date   WBC 6.6 05/17/2022   HGB 14.7 05/17/2022   HCT 43.6 05/17/2022   MCV 84 05/17/2022   PLT 233 05/17/2022      Component Value Date/Time   NA 142 05/17/2022 0804   K 4.0 05/17/2022 0804   CL 105 05/17/2022 0804   CO2 22 05/17/2022 0804   GLUCOSE 96 05/17/2022 0804   GLUCOSE 81 05/21/2021 1123   BUN 11 05/17/2022 0804   CREATININE 1.09 05/17/2022 0804   CALCIUM 9.5 05/17/2022 0804   PROT 7.3 05/17/2022 0804   ALBUMIN 4.3 05/17/2022 0804   AST 15 05/17/2022 0804   ALT 26 05/17/2022 0804   ALKPHOS 96 05/17/2022 0804   BILITOT 0.3 05/17/2022 0804   GFRNONAA 69 09/04/2020 1205   GFRAA 80 09/04/2020 1205   No results found for: "CHOL", "HDL", "LDLCALC", "LDLDIRECT", "TRIG", "CHOLHDL" Lab Results  Component Value Date   HGBA1C 5.9 (H) 09/04/2020   Lab Results  Component Value Date   VITAMINB12 310 07/12/2019   Lab Results  Component Value Date   TSH 1.240 09/04/2020   ASSESSMENT AND PLAN 51 y.o. 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 Epilepsy, intellectual disability  -Stable, continued staring spells few times a week, tolerates meds -Continue lamotrigine ER 300 mg, 2 tablets at bedtime -Continue Vimpat 200 mg, 1 tablets twice a day Doing better by add on Onfi '10mg'$  qhs.  -Has previously seen Dr. Kathyrn Sheriff, determined candidate for VNS, the patient decided to hold off   3.  Mood disorder -Stable on BuSpar 15 mg 3 times a day  Marcial Pacas, M.D. Ph.D.  [facility] [place], [place] [address] Phone: [phone] Fax:      [phone]

## 2022-07-16 ENCOUNTER — Other Ambulatory Visit: Payer: Self-pay | Admitting: Neurology

## 2022-07-17 ENCOUNTER — Other Ambulatory Visit: Payer: Self-pay | Admitting: Neurology

## 2022-07-19 ENCOUNTER — Other Ambulatory Visit: Payer: Self-pay | Admitting: Neurology

## 2022-07-19 NOTE — Telephone Encounter (Signed)
Verify Drug Registry For Vimpat 200 Mg Tablet Last Filled: 04/22/2022 Quantity: 180 tablets for 90 days Last appointment: 06/15/2022 Next appointment: 12/28/2022

## 2022-07-20 ENCOUNTER — Telehealth: Payer: Self-pay | Admitting: Neurology

## 2022-07-20 NOTE — Telephone Encounter (Signed)
The patient's mother called stating he is almost out of his medication. She is requesting that it be called in today.

## 2022-07-20 NOTE — Telephone Encounter (Signed)
Verify Drug Registry For Clobazam 10 Mg Tablet Last Filled: 06/14/2022 Quantity: 30 tablets for 30 days Last appointment: 06/15/2022 Next appointment: 12/28/2022

## 2022-07-20 NOTE — Telephone Encounter (Signed)
A refill request has been sent to Dr. Krista Blue for approval. I have marked it as high priority and attached the message from the patient's mother.

## 2022-07-20 NOTE — Telephone Encounter (Signed)
Pt mother is calling and requesting that lacosamide (VIMPAT) 200 MG TABS tablet and lacosamide (VIMPAT) 200 MG TABS tablet  be sent to pharmacy. Lelan Pons states that Pt is almost out of medication and can this be called in today,

## 2022-11-08 ENCOUNTER — Telehealth: Payer: Self-pay | Admitting: Neurology

## 2022-11-08 NOTE — Telephone Encounter (Signed)
Pt's mother called stating that she received a letter from insurance [facility] stating that the last day that they will be covering the pt's VIMPAT 200 MG TABS tablet will be on Dec. 31st and pt would have to switch to generic or they would need a exemption letter from the provider stating why pt needs to stay on Brand. Please advise.

## 2022-11-09 NOTE — Telephone Encounter (Signed)
I called and attempted mother's #, could not LM.  I call other # listed was able to LM.  I relayed need to have letter to see what they are saying specifically.  Has he tried generic vimpat ever?

## 2022-11-09 NOTE — Telephone Encounter (Signed)
Phone rep called pt to request a copy of the letter be brought here to the clinic, the 1st time phone rep called someone picked and said nothing even after phone rep said hello, the call was then disconnected.  Phone rep called back and the exact thing happened again, there was never an option to leave a message. This is FYI for POD 2

## 2022-11-14 ENCOUNTER — Other Ambulatory Visit: Payer: Self-pay | Admitting: Neurology

## 2022-11-16 NOTE — Telephone Encounter (Signed)
Wee have attempted to reach the pt multiple times since originally calling on this message.

## 2022-11-16 NOTE — Telephone Encounter (Signed)
Phone rep tried calling pt again. phone rep called someone picked and said nothing.  There was no option to leave a vm.

## 2022-12-01 NOTE — Telephone Encounter (Signed)
Pt's mother called again. She would like RN to call her back on the Home number.

## 2022-12-09 NOTE — Telephone Encounter (Signed)
Received letter that brand name vimpat is no longer covered, we would like to continue with a PA,

## 2022-12-10 ENCOUNTER — Other Ambulatory Visit (HOSPITAL_COMMUNITY): Payer: Self-pay

## 2022-12-10 NOTE — Telephone Encounter (Signed)
Pharmacy Patient Advocate Encounter   Received notification from [facility] that prior authorization for Vimpat '200MG'$  tablets is required/requested.    PA submitted on 12/10/2022 to (ins) [employer]  via CoverMyMeds Key [id number] Status is pending

## 2022-12-13 NOTE — Telephone Encounter (Signed)
Pharmacy Patient Advocate Encounter  Received notification from [employer] that the request for prior authorization for Vimpat 200 MG Tablets has been denied due to     .     Please be advised we currently do not have a Pharmacist to review denials. If you would like [place] to submit it on your behalf, please provide clinical information to support your reason for appeal and any pertinent information you would like [place] to include with the appeal request. Appeals may take longer 5 business days to be submitted as we prepares necessary documentation. Thanks for your support.  How would you like to proceed?

## 2022-12-14 ENCOUNTER — Other Ambulatory Visit: Payer: Self-pay | Admitting: Neurology

## 2022-12-14 MED ORDER — LACOSAMIDE 200 MG PO TABS: 200.0000 mg | ORAL_TABLET | Freq: Two times a day (BID) | ORAL | 3 refills | Status: DC

## 2022-12-14 NOTE — Addendum Note (Signed)
Addended by: Marcial Pacas on: 12/14/2022 09:58 AM   Modules accepted: Orders

## 2022-12-14 NOTE — Telephone Encounter (Signed)
It is Ok to use Generic Lacosamide at the prescribed dose of  '200mg'$  bid, do not have to be on Brand Vimpat  New Rx Meds ordered this encounter  Medications   lacosamide (VIMPAT) 200 MG TABS tablet    Sig: Take 1 tablet (200 mg total) by mouth 2 (two) times daily.    Dispense:  180 tablet    Refill:  3

## 2022-12-28 ENCOUNTER — Ambulatory Visit (INDEPENDENT_AMBULATORY_CARE_PROVIDER_SITE_OTHER): Payer: 59 | Admitting: Neurology

## 2022-12-28 ENCOUNTER — Encounter: Payer: Self-pay | Admitting: Neurology

## 2022-12-28 VITALS — BP 120/80 | HR 65 | Ht 65.0 in | Wt 229.0 lb

## 2022-12-28 DIAGNOSIS — R569 Unspecified convulsions: Secondary | ICD-10-CM

## 2022-12-28 DIAGNOSIS — F819 Developmental disorder of scholastic skills, unspecified: Secondary | ICD-10-CM

## 2022-12-28 NOTE — Patient Instructions (Signed)
Continue current medications Check to make sure you can refill the Onfi See you back in 6 months

## 2022-12-28 NOTE — Progress Notes (Signed)
HISTORY  Andrew [name], 52 year old male returns for followup. He is with his mother. He has a history of intractable seizure disorder since 52 years old. He has been followed in the office since 1990. 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   He has had EMU monitoring at [facility] in the past that included abnormal baseline EEG due to intermittent predominantly right temporal spike, sharp right,and focal slowing. He continues to refuse surgery to remove irritable focus.   Over the years, he has tried different combination, he has been on current medications   Topamax 100 mg twice a day, +50 mg twice a day, Dilantin 100 mg 2 tablets twice a day, Keppra 500 mg 3 tablets b.i.d, brand name    He is also taking Buspirone 10 mg t.i.d., citalopram 20 mg every day for his depression and angry control prescribed by psychiatry.   He continues to have 2-3 seizures every day, complex partial with secondary generalization, he had burst open few helmet due to his seizure, he continued to smoke marijuana and cigarette. He helps his mother taking care of his elderly bed ridden grandmother at home, has raging spells sometimes.    Vimpat 150 twice a day was added since 2015,    He still has seizure almost daily,   He is now taking Topamax 100 plus 50 mg twice a day , Dilantin 200 mg twice a day, Keppra 500 mg 3 tablets twice a day, Vimpat 150 mg twice a day   UPDATE February 07 2017: He came here urgently in early February 2018 because bilateral hands shaking, he was given the prescription of Vraylar, his shaking has much improved after stop the medication.   He is going through dental procedure, his mother had POA, they agreed to proceed with VNS   UPDATE June 09 2017: His seizure overall has much improved, is no longer having a daily basis, is tolerating current medications, but yesterday on June 08 2017, he had few recurrent seizures, he is still going through dental procedures,   He is  taking Keppra 500 mg 3 tablets, Vimpat 150 mg 2 tablets, topiramate 100 mg 2 tablets twice a day   UPDATE Dec 12 2017: His seizure overall has improved some, he also goes to [facility], taking buspiprone, which has helped his mood some,   Continue have frequent migraine headaches, 3-4 times each week, sometimes multiple episode in the day,   He smokes cigarettes some, mother has convinced him to stop use marijuana.   He continue have significant depression, after discussed with mother and patient, we decided to switch Keppra to lamotrigine,   We also talked about VNS placement, he wants to hold off    UPDATE March 20 2018: He is doing much better now with current combinations, lamotrigine 100 mg 2 tablets twice a day, Vimpat 150 mg 2 tablets twice a day, Topamax 100 mg 2 tablets twice a day   Only few recurrent seizure, much better than his previous baseline his mood has improved as well   UPDATE July 20 2018: He is doing much better now with current combination of Vimpat 150 mg 2 tablets twice a day, lamotrigine 100 mg 3 tablets twice a day, Topamax 100 mg twice a day, he only has seizure once or twice each week, they are short lasting, sometimes without recurrent seizure in 1 week, this is in contrast to previous daily multiple seizure episode,   But he complains of dizziness, lightheadedness after each dose  of medications,   His depression has much improved too, is more compliant with his mother   EEG in April 2019 showed generalized epileptiform discharge in the background of mild dysrhythmic slowing.   Update October 04, 2018 He is accompanied by his mother at today's visit, he presented to emergency room on September 24, 2018 after found falling down at home, he has no recollection of the event, mother thought he might have a seizure, his seizure overall is under excellent control, tolerating lamotrigine XR 300 mg 2 tablets at nighttime, Vimpat 2 tablets twice a day,  his mood is also under good control.   I personally reviewed MRI of brain: Advanced atrophy of the cerebellum, likely secondary to chronic antiepileptic medication MRI of cervical spine: Multilevel degenerative changes, moderate to severe foraminal narrowing at C3-4, C7-T1, secondary to multilevel uncovertebral disease, there is no evidence of canal stenosis.   Patient complains slow worsening gait abnormality over the past few months, urinary frequency, urgency, chronic low back pain, also mild neck pain radiating pain to right hand, arm   Virtual Visit via Phone March 14 2019: I interviewed patient and his mother, his seizure is under very good control, only had 3 seizure and few staring spells over past 4 months.   He continues to have gait abnormalities, using walker, right arm weakness, no incontinence.   EMG/NCS on Nov 10 2018: showed evidence of active right cervical radiculopathy, involving right C7,8, T1, moderate C6 myotomes.    He was seen by neurosurgeon, not a surgical candidate.    UPDATE July 12 2019: He is doing very well from a seizure standpoint with current combination lamotrigine ER 300 mg 2 tablets at bedtime, Vimpat 200 mg 2 tablets twice a day, he had minor staring spells about once a week, rarely has generalized seizures   Continue have significant right hand weakness, but not progressing, continue to have gait abnormality,   Mother reported few episode of sudden onset generalized weakness, could not walk, left arm against gravity, most recent episode was July 10, 2019, he has got up taking his morning medications, he noticed sudden onset weakness, he was able to call mother to check on it, episode lasted about few hours, gradually improved   Reviewed emergency room presentation on November 20 2018, similar episode on September 24, 2018, during the spells, he was able to move his toes and fingers, but very slowly, laboratory evaluations December showed negative  troponin, normal CMP, with glucose of 99, normal CBC,   UPDATE Jan 15 2020: He is overall doing very well, there was no recurrent generalized tonic-clonic seizure, but he has staring spells, transient unresponsiveness almost daily basis, he no longer has to wear his helmet, continue have right hand weakness, gait difficulty has improved   UPDATE February 18 2020: He was seen by neurosurgeon Dr. Kathyrn Sheriff, deemed to be a good candidate for vagal nerve stimulation, however, patient have a tendency to back-and-forth with this decision, all VNS related question was answered, despite polypharmacy large dose of Vimpat 400 mg twice a day, lamotrigine 600 mg daily, he continues to have frequent spells of "small seizures", staring into space, unresponsive for 1 minutes,  this is a significant compared to previous frequent drop attacks, he has to wear helmet in the past,   He continue have significant right more than left hand weakness, gait abnormality, he denies significant sensory changes   He came in today for repeat electrodiagnostic study today, which showed 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  I will refer him to fluoroscopy guided lumbar puncture   I again personally reviewed MRIs in 2019, MRI of cervical spine/brain: No acute abnormality, advanced atrophy of cerebellum likely secondary to chronic antielliptic medication therapy, multilevel cervical degenerative changes, evidence of significant canal stenosis, moderate to severe at bilateral C4-5, C5-6, C6-7, C7-T1 MRI of lumbar spine showed mild degenerative changes, no significant canal or foraminal narrowing.   UPDATE Sep 04 2020: His  mother reported 3 episode of patient woke up in the morning feeling body weakness, slurred speech, but was able to communicate clearly his idea, lasting for hours, gradually resolved, there was no loss of consciousness, this is different from any of his spells in the past,   He has no generalized tonic-clonic seizure, taking Vimpat 200 mg 2 tablets twice a day, lamotrigine xr24 hours 300 mg 2 tablets every night,   UPDATE June 02 2021: He is accompanied by his mother at today's visit, overall doing very well, only he has couple staring off spells each week, tolerating lamotrigine xr 300 mg 2 tablets every night, Vimpat 200 mg 2 tablets twice a day,   Continue has right hand muscle atrophy, weakness, this started gradually since 2019, no significant progression, no sensory component, 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 Remains on Vimpat and Lamictal.   UPDATE July 25th 2023 Overall very stable with his current medications, rarely has small seizure, short lasting, continue have right hand atrophy, gait has improved,  Repeat EEG in June 2023 was normal  Currently taking clobazam 10 mg every night, Vimpat 200 mg twice a day, lamotrigine ER 300 mg 2 tablets every night level was 10.3, lacosamide level was 10.5, normal CMP CBC,  Update December 28, 2022 SS: Seizures doing well, episodes of short spells, turning head to the right, mom will redirect him, it goes away. Happens few times a month. On onfi 10 mg BID, Vimpat 200  mg BID, will switch to generic, Lamictal XR 600 mg at bedtime. Labs June 2023 normal CBC CMP, Lamictal 10.3, Vimpat 10.5. Balance is better.   REVIEW OF SYSTEMS: Out of a complete 14 system review of symptoms, the patient complains only of the following symptoms, and all other reviewed systems are negative.  See HPI  ALLERGIES: Allergies  Allergen Reactions   Other     IVIG - hypertension     HOME MEDICATIONS: Outpatient Medications Prior to Visit  Medication Sig Dispense Refill   acetaminophen (TYLENOL) 325 MG tablet Take 325 mg by mouth every 6 (six) hours as needed for moderate pain.     amLODipine (NORVASC) 10 MG tablet Take 10 mg by mouth daily.     atorvastatin (LIPITOR) 10 MG tablet Take 10 mg by mouth daily.     busPIRone (BUSPAR) 15  MG tablet TAKE 1 TABLET(15 MG) BY MOUTH THREE TIMES DAILY 90 tablet 1   Cholecalciferol (VITAMIN D3) 50 MCG (2000 UT) capsule Take 2,000 Units by mouth daily.     cloBAZam (ONFI) 10 MG tablet TAKE 1 TABLET(10 MG) BY MOUTH AT BEDTIME 90 tablet 3   Cyanocobalamin (VITAMIN B-12 PO) Take 1 tablet by mouth daily.     docusate sodium (COLACE) 100 MG capsule Take 100 mg by mouth daily as needed for mild constipation.     dutasteride (AVODART) 0.5 MG capsule Take 0.5 mg by mouth every morning.     fluticasone (FLONASE) 50 MCG/ACT nasal spray Place 1 spray into both nostrils daily as needed for allergies or rhinitis.     lacosamide (VIMPAT) 200 MG TABS tablet Take 1 tablet (200 mg total) by mouth 2 (two) times daily. 180 tablet 3   LamoTRIgine 300 MG TB24 24 hour tablet TAKE 2 TABLETS BY MOUTH AT BEDTIME 180 tablet 1   losartan (COZAAR) 25 MG tablet Take 25 mg by mouth daily.     tamsulosin (FLOMAX) 0.4 MG CAPS capsule Take 0.4 mg by mouth daily.     UNABLE TO FIND Take 1 tablet by mouth daily. Med Name: [place] + Vitamin C     VIMPAT 200 MG TABS tablet TAKE 1 TABLET BY MOUTH TWICE DAILY 180 tablet 3   zinc gluconate 50 MG tablet Take 50 mg by mouth  daily.     No facility-administered medications prior to visit.    PAST MEDICAL HISTORY: Past Medical History:  Diagnosis Date   Anxiety    Depression    Seizures ([place])     PAST SURGICAL HISTORY: Past Surgical History:  Procedure Laterality Date   COLONOSCOPY WITH PROPOFOL N/A 07/09/2021   Procedure: COLONOSCOPY WITH PROPOFOL;  Surgeon: Jerene Bears, MD;  Location: [facility];  Service: Gastroenterology;  Laterality: N/A;   none     POLYPECTOMY  07/09/2021   Procedure: POLYPECTOMY;  Surgeon: Jerene Bears, MD;  Location: [facility];  Service: Gastroenterology;;    FAMILY HISTORY: Family History  Problem Relation Age of Onset   Asthma Father     SOCIAL HISTORY: Social History   Socioeconomic History   Marital status: Single    Spouse name: Not on file   Number of children: 0   Years of education: 12   Highest education level: Not on file  Occupational History    Comment: Disabled  Tobacco Use   Smoking status: Former    Packs/day: 0.50    Years: 20.00    Total pack years: 10.00    Types: Cigarettes   Smokeless tobacco: Never  Vaping Use   Vaping Use: Never used  Substance and Sexual Activity   Alcohol use: No    Alcohol/week: 0.0 standard drinks of alcohol   Drug use: No    Comment: 11/08/16 - reports he has stopped smoking marijuana.   Sexual activity: Not on file  Other Topics Concern   Not on file  Social History Narrative   Patient lives at home with his mother Aristotelis Vilardi ). Patient is disabled. Patient has high school education.   Left handed.   Caffeine- soda - pepsi four cans daily.   Patients father died of over dose.-40   Social Determinants of Health   Financial Resource Strain: Not on file  Food Insecurity: Not on file  Transportation Needs: Not on file  Physical Activity: Not on file  Stress: Not on file  Social Connections: Not on file  Intimate Partner Violence: Not on file   PHYSICAL EXAM  Vitals:   12/28/22 1441  BP:  120/80  Pulse: 65  Weight: 229 lb (103.9 kg)  Height: '5\' 5"'$  (1.651 m)    Body mass index is 38.11 kg/m.  Generalized: Well-appearing, well-dressed Neurological examination  Mentation: Alert oriented to time, place, mom assists with history. Follows all commands speech and language fluent Cranial nerve II-XII: Pupils were equal round reactive to light. Extraocular movements were full, visual field were full on confrontational test. Facial sensation and strength were normal.  Head turning and shoulder shrug were normal and symmetric. Motor: Significant right hand atrophy, decreased strength to right hand with grip strength Sensory: Intact to soft touch to face, arms, legs Coordination: Cerebellar testing reveals good finger-nose-finger and heel-to-shin bilaterally.  Gait and station: Needs push-up to get up from sitting position, cautious wide-based, Reflexes: Areflexia  DIAGNOSTIC DATA (LABS, IMAGING, TESTING) - I reviewed patient records, labs, notes, testing and imaging myself where available.  Lab Results  Component Value Date   WBC 6.6 05/17/2022   HGB 14.7 05/17/2022   HCT 43.6 05/17/2022   MCV 84 05/17/2022   PLT 233 05/17/2022      Component Value Date/Time   NA 142 05/17/2022 0804   K 4.0 05/17/2022 0804   CL 105 05/17/2022 0804   CO2 22 05/17/2022 0804   GLUCOSE 96 05/17/2022 0804   GLUCOSE 81 05/21/2021 1123   BUN 11 05/17/2022 0804   CREATININE 1.09 05/17/2022 0804   CALCIUM 9.5 05/17/2022 0804   PROT 7.3 05/17/2022 0804   ALBUMIN 4.3 05/17/2022 0804   AST 15 05/17/2022 0804   ALT 26 05/17/2022 0804   ALKPHOS 96 05/17/2022 0804   BILITOT 0.3 05/17/2022 0804   GFRNONAA 69 09/04/2020 1205   GFRAA 80 09/04/2020 1205   No results found for: "CHOL", "HDL", "LDLCALC", "LDLDIRECT", "TRIG", "CHOLHDL" Lab Results  Component Value Date   HGBA1C 5.9 (H) 09/04/2020   Lab Results  Component Value Date   VITAMINB12 310 07/12/2019   Lab Results  Component Value  Date   TSH 1.240 09/04/2020   ASSESSMENT AND PLAN 52 y.o. 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 Epilepsy, intellectual disability  -Doing well, has continued small spells few times a month, addition of Onfi has been most helpful -Continue lamotrigine ER 300 mg, 2 tablets at bedtime -Continue Vimpat 200 mg, 1 tablets twice a day -Continue to Onfi '10mg'$  qhs (should have 1 refill left, mom will check) -Has previously seen Dr. Kathyrn Sheriff, determined candidate for VNS, the patient decided to hold off  -Check labs at next visit, follow-up in 6 months  3.  Mood disorder -Stable on BuSpar 15 mg 3 times a day  Butler Denmark, Laqueta Jean, DNP  [facility] [address], [place], [place] [address] [phone]

## 2023-01-09 ENCOUNTER — Other Ambulatory Visit: Payer: Self-pay | Admitting: Neurology

## 2023-03-10 ENCOUNTER — Other Ambulatory Visit: Payer: Self-pay | Admitting: Neurology

## 2023-04-07 ENCOUNTER — Other Ambulatory Visit: Payer: Self-pay | Admitting: Neurology

## 2023-04-11 NOTE — Telephone Encounter (Signed)
Meds ordered this encounter  Medications   cloBAZam (ONFI) 10 MG tablet    Sig: TAKE 1 TABLET(10 MG) BY MOUTH AT BEDTIME    Dispense:  90 tablet    Refill:  3

## 2023-04-19 ENCOUNTER — Other Ambulatory Visit: Payer: Self-pay

## 2023-04-19 ENCOUNTER — Other Ambulatory Visit: Payer: Self-pay | Admitting: Neurology

## 2023-04-19 MED ORDER — LACOSAMIDE 200 MG PO TABS: 200.0000 mg | ORAL_TABLET | Freq: Two times a day (BID) | ORAL | 1 refills | Status: DC

## 2023-04-19 MED ORDER — LAMOTRIGINE ER 300 MG PO TB24: 2.0000 | ORAL_TABLET | Freq: Every day | ORAL | 1 refills | Status: DC

## 2023-04-19 NOTE — Telephone Encounter (Signed)
Requested Prescriptions   Pending Prescriptions Disp Refills   LamoTRIgine 300 MG TB24 24 hour tablet 180 tablet 0    Sig: Take 2 tablets (600 mg total) by mouth at bedtime.   losartan (COZAAR) 25 MG tablet 90 tablet 0    Sig: Take 1 tablet (25 mg total) by mouth daily.   tamsulosin (FLOMAX) 0.4 MG CAPS capsule 90 capsule 0    Sig: Take 1 capsule (0.4 mg total) by mouth daily.   cloBAZam (ONFI) 10 MG tablet 90 tablet 0    Sig: TAKE 1 TABLET(10 MG) BY MOUTH AT BEDTIME   Last seen 12/28/22 by slack np, next appt scheduled for 07/06/23 Dispenses   Dispensed Days Supply Quantity Provider Pharmacy  CLOBAZAM 10MG  TABLETS 04/15/2023 30 30 each Levert Feinstein, MD [facility]  CLOBAZAM 10MG  TABLETS 01/16/2023 90 90 each Levert Feinstein, MD [facility]  CLOBAZAM 10MG  TABLETS 10/19/2022 90 90 each Levert Feinstein, MD [facility]  CLOBAZAM 10MG  TABLETS 07/20/2022 90 90 each Levert Feinstein, MD [facility]  CLOBAZAM 10MG  TABLETS 06/14/2022 30 30 each Levert Feinstein, MD [facility]  CLOBAZAM 10MG  TABLETS 05/19/2022 30 30 each Levert Feinstein, MD [facility]       Routing to sarah slack np as she has seen the patient before and Dr. Terrace Arabia is out. I have pended all the refills I couldn't figure out how to send all but the one that is controlled.

## 2023-04-19 NOTE — Telephone Encounter (Signed)
Requested Prescriptions   Pending Prescriptions Disp Refills   VIMPAT 200 MG TABS tablet 180 tablet 3    Sig: Take 1 tablet (200 mg total) by mouth 2 (two) times daily.   Last seen 12/28/22 by slack np, next appt scheduled for  07/06/23 w/slack  Lacosamide Adherence  Dispenses   Dispensed Days Supply Quantity Provider Pharmacy  LACOSAMIDE 200MG  TABLETS 01/18/2023 90 170 each Levert Feinstein, MD [facility]  LACOSAMIDE 200MG  TABLETS 01/17/2023 5 10 each Levert Feinstein, MD [facility]  VIMPAT 200MG  TABLETS 10/18/2022 90 180 each Levert Feinstein, MD [facility]  VIMPAT 200MG  TABLETS 07/22/2022 90 180 each Levert Feinstein, MD [facility]       Routing to sarah slack as she has seen the patient and can fill in Dr. Zannie Cove absence

## 2023-04-19 NOTE — Telephone Encounter (Signed)
Pt mother called. Stated pt needs refill on VIMPAT 200 MG TABS tablet. Stated refill should be sent to  [facility]

## 2023-04-20 ENCOUNTER — Other Ambulatory Visit: Payer: Self-pay

## 2023-05-27 ENCOUNTER — Other Ambulatory Visit: Payer: Self-pay | Admitting: Neurology

## 2023-06-01 MED ORDER — BUSPIRONE HCL 15 MG PO TABS: ORAL_TABLET | ORAL | 1 refills | Status: DC

## 2023-07-06 ENCOUNTER — Ambulatory Visit (INDEPENDENT_AMBULATORY_CARE_PROVIDER_SITE_OTHER): Payer: Medicare HMO | Admitting: Neurology

## 2023-07-06 ENCOUNTER — Encounter: Payer: Self-pay | Admitting: Neurology

## 2023-07-06 ENCOUNTER — Telehealth: Payer: Self-pay

## 2023-07-06 VITALS — BP 128/81 | HR 73 | Ht 70.0 in | Wt 229.0 lb

## 2023-07-06 DIAGNOSIS — G40209 Localization-related (focal) (partial) symptomatic epilepsy and epileptic syndromes with complex partial seizures, not intractable, without status epilepticus: Secondary | ICD-10-CM

## 2023-07-06 DIAGNOSIS — R269 Unspecified abnormalities of gait and mobility: Secondary | ICD-10-CM

## 2023-07-06 DIAGNOSIS — G629 Polyneuropathy, unspecified: Secondary | ICD-10-CM | POA: Diagnosis not present

## 2023-07-06 DIAGNOSIS — R569 Unspecified convulsions: Secondary | ICD-10-CM

## 2023-07-06 MED ORDER — LAMOTRIGINE ER 300 MG PO TB24: 2.0000 | ORAL_TABLET | Freq: Every day | ORAL | 4 refills | Status: DC

## 2023-07-06 MED ORDER — LAMOTRIGINE ER 300 MG PO TB24: 2.0000 | ORAL_TABLET | Freq: Every day | ORAL | 0 refills | Status: DC

## 2023-07-06 NOTE — Telephone Encounter (Signed)
-----   Message from Glean Salvo sent at 07/06/2023  2:58 PM EDT ----- Please make sure [facility] received my script for Lamictal. I sent short term supply to local pharmacy, but patient still needs his mail order. Thanks

## 2023-07-06 NOTE — Telephone Encounter (Signed)
Called and spoke to [facility] and they confirmed rx was recieved

## 2023-07-06 NOTE — Progress Notes (Signed)
HISTORY  Andrew [name], 52 year old male returns for followup. He is with his mother. He has a history of intractable seizure disorder since 52 years old. He has been followed in the office since 1990. 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   He has had EMU monitoring at [facility] in the past that included abnormal baseline EEG due to intermittent predominantly right temporal spike, sharp right,and focal slowing. He continues to refuse surgery to remove irritable focus.   Over the years, he has tried different combination, he has been on current medications   Topamax 100 mg twice a day, +50 mg twice a day, Dilantin 100 mg 2 tablets twice a day, Keppra 500 mg 3 tablets b.i.d, brand name    He is also taking Buspirone 10 mg t.i.d., citalopram 20 mg every day for his depression and angry control prescribed by psychiatry.   He continues to have 2-3 seizures every day, complex partial with secondary generalization, he had burst open few helmet due to his seizure, he continued to smoke marijuana and cigarette. He helps his mother taking care of his elderly bed ridden grandmother at home, has raging spells sometimes.    Vimpat 150 twice a day was added since 2015,    He still has seizure almost daily,   He is now taking Topamax 100 plus 50 mg twice a day , Dilantin 200 mg twice a day, Keppra 500 mg 3 tablets twice a day, Vimpat 150 mg twice a day   UPDATE February 07 2017: He came here urgently in early February 2018 because bilateral hands shaking, he was given the prescription of Vraylar, his shaking has much improved after stop the medication.   He is going through dental procedure, his mother had POA, they agreed to proceed with VNS   UPDATE June 09 2017: His seizure overall has much improved, is no longer having a daily basis, is tolerating current medications, but yesterday on June 08 2017, he had few recurrent seizures, he is still going through dental procedures,   He is  taking Keppra 500 mg 3 tablets, Vimpat 150 mg 2 tablets, topiramate 100 mg 2 tablets twice a day   UPDATE Dec 12 2017: His seizure overall has improved some, he also goes to [employer] [facility], taking buspiprone, which has helped his mood some,   Continue have frequent migraine headaches, 3-4 times each week, sometimes multiple episode in the day,   He smokes cigarettes some, mother has convinced him to stop use marijuana.   He continue have significant depression, after discussed with mother and patient, we decided to switch Keppra to lamotrigine,   We also talked about VNS placement, he wants to hold off    UPDATE March 20 2018: He is doing much better now with current combinations, lamotrigine 100 mg 2 tablets twice a day, Vimpat 150 mg 2 tablets twice a day, Topamax 100 mg 2 tablets twice a day   Only few recurrent seizure, much better than his previous baseline his mood has improved as well   UPDATE July 20 2018: He is doing much better now with current combination of Vimpat 150 mg 2 tablets twice a day, lamotrigine 100 mg 3 tablets twice a day, Topamax 100 mg twice a day, he only has seizure once or twice each week, they are short lasting, sometimes without recurrent seizure in 1 week, this is in contrast to previous daily multiple seizure episode,   But he complains of dizziness, lightheadedness after each dose  of medications,   His depression has much improved too, is more compliant with his mother   EEG in April 2019 showed generalized epileptiform discharge in the background of mild dysrhythmic slowing.   Update October 04, 2018 He is accompanied by his mother at today's visit, he presented to emergency room on September 24, 2018 after found falling down at home, he has no recollection of the event, mother thought he might have a seizure, his seizure overall is under excellent control, tolerating lamotrigine XR 300 mg 2 tablets at nighttime, Vimpat 2 tablets twice a day,  his mood is also under good control.   I personally reviewed MRI of brain: Advanced atrophy of the cerebellum, likely secondary to chronic antiepileptic medication MRI of cervical spine: Multilevel degenerative changes, moderate to severe foraminal narrowing at C3-4, C7-T1, secondary to multilevel uncovertebral disease, there is no evidence of canal stenosis.   Patient complains slow worsening gait abnormality over the past few months, urinary frequency, urgency, chronic low back pain, also mild neck pain radiating pain to right hand, arm   Virtual Visit via Phone March 14 2019: I interviewed patient and his mother, his seizure is under very good control, only had 3 seizure and few staring spells over past 4 months.   He continues to have gait abnormalities, using walker, right arm weakness, no incontinence.   EMG/NCS on Nov 10 2018: showed evidence of active right cervical radiculopathy, involving right C7,8, T1, moderate C6 myotomes.    He was seen by neurosurgeon, not a surgical candidate.    UPDATE July 12 2019: He is doing very well from a seizure standpoint with current combination lamotrigine ER 300 mg 2 tablets at bedtime, Vimpat 200 mg 2 tablets twice a day, he had minor staring spells about once a week, rarely has generalized seizures   Continue have significant right hand weakness, but not progressing, continue to have gait abnormality,   Mother reported few episode of sudden onset generalized weakness, could not walk, left arm against gravity, most recent episode was July 10, 2019, he has got up taking his morning medications, he noticed sudden onset weakness, he was able to call mother to check on it, episode lasted about few hours, gradually improved   Reviewed emergency room presentation on November 20 2018, similar episode on September 24, 2018, during the spells, he was able to move his toes and fingers, but very slowly, laboratory evaluations December showed negative  troponin, normal CMP, with glucose of 99, normal CBC,   UPDATE Jan 15 2020: He is overall doing very well, there was no recurrent generalized tonic-clonic seizure, but he has staring spells, transient unresponsiveness almost daily basis, he no longer has to wear his helmet, continue have right hand weakness, gait difficulty has improved   UPDATE February 18 2020: He was seen by neurosurgeon Dr. Conchita Paris, deemed to be a good candidate for vagal nerve stimulation, however, patient have a tendency to back-and-forth with this decision, all VNS related question was answered, despite polypharmacy large dose of Vimpat 400 mg twice a day, lamotrigine 600 mg daily, he continues to have frequent spells of "small seizures", staring into space, unresponsive for 1 minutes,  this is a significant compared to previous frequent drop attacks, he has to wear helmet in the past,   He continue have significant right more than left hand weakness, gait abnormality, he denies significant sensory changes   He came in today for repeat electrodiagnostic study today, which showed 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  I will refer him to fluoroscopy guided lumbar puncture   I again personally reviewed MRIs in 2019, MRI of cervical spine/brain: No acute abnormality, advanced atrophy of cerebellum likely secondary to chronic antielliptic medication therapy, multilevel cervical degenerative changes, evidence of significant canal stenosis, moderate to severe at bilateral C4-5, C5-6, C6-7, C7-T1 MRI of lumbar spine showed mild degenerative changes, no significant canal or foraminal narrowing.   UPDATE Sep 04 2020: His  mother reported 3 episode of patient woke up in the morning feeling body weakness, slurred speech, but was able to communicate clearly his idea, lasting for hours, gradually resolved, there was no loss of consciousness, this is different from any of his spells in the past,   He has no generalized tonic-clonic seizure, taking Vimpat 200 mg 2 tablets twice a day, lamotrigine xr24 hours 300 mg 2 tablets every night,   UPDATE June 02 2021: He is accompanied by his mother at today's visit, overall doing very well, only he has couple staring off spells each week, tolerating lamotrigine xr 300 mg 2 tablets every night, Vimpat 200 mg 2 tablets twice a day,   Continue has right hand muscle atrophy, weakness, this started gradually since 2019, no significant progression, no sensory component, 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 Remains on Vimpat and Lamictal.   UPDATE July 25th 2023 Overall very stable with his current medications, rarely has small seizure, short lasting, continue have right hand atrophy, gait has improved,  Repeat EEG in June 2023 was normal  Currently taking clobazam 10 mg every night, Vimpat 200 mg twice a day, lamotrigine ER 300 mg 2 tablets every night level was 10.3, lacosamide level was 10.5, normal CMP CBC,  Update December 28, 2022 SS: Seizures doing well, episodes of short spells, turning head to the right, mom will redirect him, it goes away. Happens few times a month. On onfi 10 mg BID, Vimpat 200  mg BID, will switch to generic, Lamictal XR 600 mg at bedtime. Labs June 2023 normal CBC CMP, Lamictal 10.3, Vimpat 10.5. Balance is better.   Update July 06, 2023 SS: Here with mom, doing well, seizures very brief, stare off few seconds, becoming less frequent/less intense, bounces back quickly. Getting around okay, no falls. On Onfi 10 mg at bedtime, Vimpat 200 mg BID, Lamictal 300 mg XR 2 at bedtime. On Buspar 15 mg daily 3 times daily. Seeing PCP, doing well, going to get some counseling.   REVIEW OF SYSTEMS: Out of a complete 14 system review of symptoms, the patient complains only of the following symptoms, and all other reviewed systems are negative.  See HPI  ALLERGIES: Allergies  Allergen Reactions   Other     IVIG - hypertension     HOME MEDICATIONS: Outpatient Medications Prior  to Visit  Medication Sig Dispense Refill   acetaminophen (TYLENOL) 325 MG tablet Take 325 mg by mouth every 6 (six) hours as needed for moderate pain.     amLODipine (NORVASC) 10 MG tablet Take 10 mg by mouth daily.     atorvastatin (LIPITOR) 10 MG tablet Take 10 mg by mouth daily.     busPIRone (BUSPAR) 15 MG tablet TAKE 1 TABLET(15 MG) BY MOUTH THREE TIMES DAILY 90 tablet 1   Cholecalciferol (VITAMIN D3) 50 MCG (2000 UT) capsule Take 2,000 Units by mouth daily.     cloBAZam (ONFI) 10 MG tablet TAKE 1 TABLET(10 MG) BY MOUTH AT BEDTIME 90 tablet 3   Cyanocobalamin (VITAMIN B-12 PO) Take 1 tablet by mouth daily.     docusate sodium (COLACE) 100 MG capsule Take 100 mg by mouth daily as needed for mild constipation.     dutasteride (AVODART) 0.5 MG capsule Take 0.5 mg by mouth every morning.     fluticasone (FLONASE) 50 MCG/ACT nasal spray Place 1 spray into both nostrils daily as needed for allergies or rhinitis.     lacosamide (VIMPAT) 200 MG TABS tablet Take 1 tablet (200 mg total) by mouth 2 (two) times daily. 180 tablet 3   lacosamide (VIMPAT) 200 MG TABS tablet Take 1 tablet (200 mg total) by mouth  2 (two) times daily. 180 tablet 1   losartan (COZAAR) 25 MG tablet Take 25 mg by mouth daily.     tamsulosin (FLOMAX) 0.4 MG CAPS capsule Take 0.4 mg by mouth daily.     UNABLE TO FIND Take 1 tablet by mouth daily. Med Name: [place] + Vitamin C     zinc gluconate 50 MG tablet Take 50 mg by mouth daily.     LamoTRIgine 300 MG TB24 24 hour tablet Take 2 tablets (600 mg total) by mouth at bedtime. 180 tablet 1   No facility-administered medications prior to visit.    PAST MEDICAL HISTORY: Past Medical History:  Diagnosis Date   Anxiety    Depression    Seizures (HCC)     PAST SURGICAL HISTORY: Past Surgical History:  Procedure Laterality Date   COLONOSCOPY WITH PROPOFOL N/A 07/09/2021   Procedure: COLONOSCOPY WITH PROPOFOL;  Surgeon: Beverley Fiedler, MD;  Location: [facility];  Service: Gastroenterology;  Laterality: N/A;   none     POLYPECTOMY  07/09/2021   Procedure: POLYPECTOMY;  Surgeon: Beverley Fiedler, MD;  Location: [facility];  Service: Gastroenterology;;    FAMILY HISTORY: Family History  Problem Relation Age of Onset   Asthma Father     SOCIAL HISTORY: Social History   Socioeconomic History   Marital status: Single    Spouse name: Not on file   Number of children: 0   Years of education: 12   Highest education level: Not on file  Occupational History    Comment: Disabled  Tobacco Use   Smoking status: Former    Current packs/day: 0.50    Average packs/day: 0.5 packs/day for 20.0 years (10.0 ttl pk-yrs)    Types: Cigarettes   Smokeless tobacco: Never  Vaping Use   Vaping status: Never Used  Substance and Sexual Activity   Alcohol use: No    Alcohol/week: 0.0 standard drinks of alcohol   Drug use: No    Comment: 11/08/16 - reports he has stopped smoking marijuana.   Sexual activity: Not on file  Other Topics Concern   Not on file  Social History Narrative   Patient  lives at home with his mother Andrew [name] ). Patient is disabled. Patient has high  school education.   Left handed.   Caffeine- soda - pepsi four cans daily.   Patients father died of over dose.-40   Social Determinants of Health   Financial Resource Strain: Not on File (03/11/2022)   [facility] [employer] [facility] [employer] [facility]   [facility] [employer]    Financial Resource Strain: 0  Food Insecurity: Not on File (03/11/2022)   Received from [place], [place]   Food Insecurity    Food: 0  Transportation Needs: Not on File (03/11/2022)   Received from [employer], [employer] Needs    Transportation: 0  Physical Activity: Not on File (03/11/2022)   Received from [place], [place]   Physical Activity    Physical Activity: 0  Stress: Not on File (03/11/2022)   Received from [facility], [place]   Stress    Stress: 0  Social Connections: Not on File (03/11/2022)   Received from [place], [place]   Social Connections    Social Connections and Isolation: 0  Intimate Partner Violence: Not on file   PHYSICAL EXAM  Vitals:   07/06/23 1429  BP: 128/81  Pulse: 73  Weight: 229 lb (103.9 kg)  Height: 5\' 10"  (1.778 m)   Body mass index is 32.86 kg/m.  Generalized: Well-appearing, well-dressed Neurological examination  Mentation: Alert oriented to time, place, mom assists with history. Follows all commands speech and language fluent Cranial nerve II-XII: Pupils were equal round reactive to light. Extraocular movements were full, visual field were full on confrontational test. Facial sensation and strength were normal.  Head turning and shoulder shrug were normal and symmetric. Motor: Significant right hand atrophy, decreased strength to right hand with grip strength, right arm strength 3/5, atrophy to right forearm Sensory: Intact to soft touch to face, arms, legs Coordination: Cerebellar testing reveals good finger-nose-finger and heel-to-shin bilaterally.  Gait and station: Needs push-up to get up from sitting position, cautious wide-based, Reflexes: Areflexia  DIAGNOSTIC DATA (LABS, IMAGING,  TESTING) - I reviewed patient records, labs, notes, testing and imaging myself where available.  Lab Results  Component Value Date   WBC 6.6 05/17/2022   HGB 14.7 05/17/2022   HCT 43.6 05/17/2022   MCV 84 05/17/2022   PLT 233 05/17/2022      Component Value Date/Time   NA 142 05/17/2022 0804   K 4.0 05/17/2022 0804   CL 105 05/17/2022 0804   CO2 22 05/17/2022 0804   GLUCOSE 96 05/17/2022 0804   GLUCOSE 81 05/21/2021 1123   BUN 11 05/17/2022 0804   CREATININE 1.09 05/17/2022 0804   CALCIUM 9.5 05/17/2022 0804   PROT 7.3 05/17/2022 0804   ALBUMIN 4.3 05/17/2022 0804   AST 15 05/17/2022 0804   ALT 26 05/17/2022 0804   ALKPHOS 96 05/17/2022 0804   BILITOT 0.3 05/17/2022 0804   GFRNONAA 69 09/04/2020 1205   GFRAA 80 09/04/2020 1205   No results found for: "CHOL", "HDL", "LDLCALC", "LDLDIRECT", "TRIG", "CHOLHDL" Lab Results  Component Value Date   HGBA1C 5.9 (H) 09/04/2020   Lab Results  Component Value Date   VITAMINB12 310 07/12/2019   Lab Results  Component Value Date   TSH 1.240 09/04/2020   ASSESSMENT AND PLAN 52 y.o. 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 Epilepsy, intellectual disability  -Doing very well, less frequent spells, shorter in duration, tolerating seizure medications -Continue lamotrigine ER 300 mg, 2 tablets at bedtime -Continue lacosamide 200 mg, 1 tablets twice a day -Continue to Onfi 10mg  qhs  -Has previously seen Dr. Conchita Paris, determined candidate for VNS, the  patient decided to hold off  -Check labs today, short supply of lamotrigine was sent to local pharmacy, in addition to maintenance to mail order  3.  Mood disorder -Stable on BuSpar 15 mg 3 times a day, PCP has referred to counseling  Follow-up with me in 8 months or sooner if needed.  Otila Kluver, DNP  [facility] [address] [place], [place] [address] [phone]

## 2023-07-06 NOTE — Patient Instructions (Addendum)
You can pick up short term supply at local [facility] of Lamictal until mail order arrives Continue other medications, refills should be current. Let me know if anything is needed. Check labs today. Follow up in 8 months  Meds ordered this encounter  Medications   DISCONTD: LamoTRIgine 300 MG TB24 24 hour tablet    Sig: Take 2 tablets (600 mg total) by mouth at bedtime.    Dispense:  180 tablet    Refill:  4   LamoTRIgine 300 MG TB24 24 hour tablet    Sig: Take 2 tablets (600 mg total) by mouth at bedtime.    Dispense:  60 tablet    Refill:  0    Please fill short term supply for patient, mail order is late

## 2023-07-09 LAB — CBC WITH DIFFERENTIAL/PLATELET
Basophils Absolute: 0.1 10*3/uL (ref 0.0–0.2)
Basos: 1 %
EOS (ABSOLUTE): 0.2 10*3/uL (ref 0.0–0.4)
Eos: 3 %
Hematocrit: 44.4 % (ref 37.5–51.0)
Hemoglobin: 15.1 g/dL (ref 13.0–17.7)
Immature Grans (Abs): 0 10*3/uL (ref 0.0–0.1)
Immature Granulocytes: 0 %
Lymphocytes Absolute: 2.5 10*3/uL (ref 0.7–3.1)
Lymphs: 41 %
MCH: 28.1 pg (ref 26.6–33.0)
MCHC: 34 g/dL (ref 31.5–35.7)
MCV: 83 fL (ref 79–97)
Monocytes Absolute: 0.5 10*3/uL (ref 0.1–0.9)
Monocytes: 8 %
Neutrophils Absolute: 2.9 10*3/uL (ref 1.4–7.0)
Neutrophils: 47 %
Platelets: 253 10*3/uL (ref 150–450)
RBC: 5.38 x10E6/uL (ref 4.14–5.80)
RDW: 13.5 % (ref 11.6–15.4)
WBC: 6.1 10*3/uL (ref 3.4–10.8)

## 2023-07-09 LAB — COMPREHENSIVE METABOLIC PANEL
ALT: 29 IU/L (ref 0–44)
AST: 16 IU/L (ref 0–40)
Albumin: 4.3 g/dL (ref 3.8–4.9)
Alkaline Phosphatase: 88 IU/L (ref 44–121)
BUN/Creatinine Ratio: 9 (ref 9–20)
BUN: 10 mg/dL (ref 6–24)
Bilirubin Total: 0.4 mg/dL (ref 0.0–1.2)
CO2: 22 mmol/L (ref 20–29)
Calcium: 9.2 mg/dL (ref 8.7–10.2)
Chloride: 108 mmol/L — ABNORMAL HIGH (ref 96–106)
Creatinine, Ser: 1.17 mg/dL (ref 0.76–1.27)
Globulin, Total: 2.9 g/dL (ref 1.5–4.5)
Glucose: 91 mg/dL (ref 70–99)
Potassium: 4.5 mmol/L (ref 3.5–5.2)
Sodium: 144 mmol/L (ref 134–144)
Total Protein: 7.2 g/dL (ref 6.0–8.5)
eGFR: 75 mL/min/{1.73_m2} (ref >59)

## 2023-07-09 LAB — LACOSAMIDE: Lacosamide: 8.3 ug/mL (ref 5.0–10.0)

## 2023-07-09 LAB — LAMOTRIGINE LEVEL: Lamotrigine Lvl: 12.6 ug/mL (ref 2.0–20.0)

## 2023-07-23 ENCOUNTER — Other Ambulatory Visit: Payer: Self-pay | Admitting: Neurology

## 2023-09-14 ENCOUNTER — Other Ambulatory Visit: Payer: Self-pay | Admitting: Neurology

## 2023-09-15 NOTE — Telephone Encounter (Signed)
  Dispenses   Dispensed Days Supply Quantity Provider Pharmacy  LACOSAMIDE 200 MG TABLET 06/29/2023 90  Glean Salvo, NP [facility] Mail D...  lacosamide 200 mg tablet 06/29/2023 90 180 tablet Glean Salvo, NP [facility] Ma...  lacosamide 200 mg tablet 04/21/2023 90 180 tablet Glean Salvo, NP [facility] Ma...  LACOSAMIDE 200MG  TABLETS 01/18/2023 90 170 each Levert Feinstein, MD [facility]  LACOSAMIDE 200MG  TABLETS 01/17/2023 5 10 each Levert Feinstein, MD [facility]  VIMPAT 200MG  TABLETS 10/18/2022 90 180 each Levert Feinstein, MD [facility]    Last visit 07/06/23 Next visit 03/14/24

## 2023-09-21 ENCOUNTER — Telehealth: Payer: Self-pay | Admitting: Neurology

## 2023-09-21 MED ORDER — LAMOTRIGINE ER 300 MG PO TB24: 2.0000 | ORAL_TABLET | Freq: Every day | ORAL | 3 refills | Status: DC

## 2023-09-21 MED ORDER — LACOSAMIDE 200 MG PO TABS: 200.0000 mg | ORAL_TABLET | Freq: Two times a day (BID) | ORAL | 3 refills | Status: DC

## 2023-09-21 MED ORDER — CLOBAZAM 10 MG PO TABS: 10.0000 mg | ORAL_TABLET | Freq: Every evening | ORAL | 0 refills | Status: DC

## 2023-09-21 MED ORDER — CLOBAZAM 10 MG PO TABS: 10.0000 mg | ORAL_TABLET | Freq: Every evening | ORAL | 3 refills | Status: DC

## 2023-09-21 NOTE — Telephone Encounter (Signed)
Called and spoke to pts mother and stated: Called pharmacy and they stated be refill to soon so pt would need to pay the $51 out of pocket.    Pt mother stated that the pt will probably have to miss a few doses since they cannot afford. They want to know if it will be detrimental to the pt.

## 2023-09-21 NOTE — Addendum Note (Signed)
Addended by: Levert Feinstein on: 09/21/2023 02:39 PM   Modules accepted: Orders

## 2023-09-21 NOTE — Telephone Encounter (Signed)
Spoke with the pharmacy they said insurance would not pay for the 7 tablets. Want to know if would be ok for him to be without medication until comes in the mail.

## 2023-09-21 NOTE — Telephone Encounter (Signed)
Pt's mother said pharmacy left a voicemail informing cannot fill medication due to trying to fill too soon. Andrew [name] said she would call the pharmacy to explain she does not need a refill just enough medication until they come in the mail

## 2023-09-21 NOTE — Telephone Encounter (Signed)
Call to patients mother, she does not want to use [facility] anymore. She called 7 days ago for refill and they just mailed today and patient doesn't have any medication for tonight. Advised I will call [facility] and cancel scripts and send to Sarah for a 7 day supply on ONFI. Mother verbalized understanding to call us when patient has 5 days medication left to request refills. Mother states they have already shipped 30 day supply and just needs enough to bridge until received.   Dispensed Days Supply Quantity Provider Pharmacy  CLOBAZAM 10 MG TABLET 09/15/2023 30  Levert Feinstein, MD [facility] Mail D...  clobazam 10 mg tablet 09/15/2023 30 30 tablet Levert Feinstein, MD [facility] Ma...  CLOBAZAM 10 MG TABLET 08/17/2023 30  Levert Feinstein, MD [facility] Mail D...  clobazam 10 mg tablet 08/17/2023 30 30 tablet Levert Feinstein, MD [facility] Ma...  CLOBAZAM 10 MG TABLET 07/13/2023 30  Levert Feinstein, MD [facility] Mail D...  clobazam 10 mg tablet 07/13/2023 30 30 tablet Levert Feinstein, MD [facility] Ma...  clobazam 10 mg tablet 06/19/2023 30 30 tablet Levert Feinstein, MD [facility] Mail D...  clobazam 10 mg tablet 05/09/2023 30 30 tablet Levert Feinstein, MD [facility] Ma...  CLOBAZAM 10MG  TABLETS 04/15/2023 30 30 each Levert Feinstein, MD [facility]  CLOBAZAM 10MG  TABLETS 01/16/2023 90 90 each Levert Feinstein, MD [facility]  CLOBAZAM 10MG  TABLETS 10/19/2022 90 90 each Levert Feinstein, MD [facility]      How do dispenses affect the score?    Outpatient Orders

## 2023-09-21 NOTE — Telephone Encounter (Signed)
Meds ordered this encounter   LamoTRIgine 300 MG TB24 24 hour tablet    Sig: Take 2 tablets (600 mg total) by mouth at bedtime.    Dispense:  93 tablet    Refill:  3    Please fill short term supply for patient, mail order is late   lacosamide (VIMPAT) 200 MG TABS tablet    Sig: Take 1 tablet (200 mg total) by mouth 2 (two) times daily.    Dispense:  186 tablet    Refill:  3   cloBAZam (ONFI) 10 MG tablet    Sig: Take 1 tablet (10 mg total) by mouth at bedtime.    Dispense:  96 tablet    Refill:  3    7 day supply to bridge until mail delivery     I have re-ordered his seizure medication to make sure he has enough medications to his mail -in Pharmacy.  Our office do not have Onfi sample, if family decided not to refill his 7 days of Onfi, in the days that he is not taking Arfeen, he can take 1 extra tablet  of Vimpat at nighttime to provide extra coverage

## 2023-09-21 NOTE — Telephone Encounter (Signed)
Called pharmacy and they stated be refill to soon so pt would need to pay the $51 out of pocket.

## 2023-09-21 NOTE — Telephone Encounter (Signed)
The issue is that they will have to pay out of pocket and they cannot afford it insurance will not cover saying refill to soon. They are saying they will stop taking onfi when they run out because they cannot pay the $51 it cost for the temporary supply

## 2023-09-21 NOTE — Telephone Encounter (Signed)
Meds ordered this encounter  Medications   cloBAZam (ONFI) 10 MG tablet    Sig: Take 1 tablet (10 mg total) by mouth at bedtime.    Dispense:  7 tablet    Refill:  0    7 day supply to bridge until mail delivery

## 2023-09-21 NOTE — Telephone Encounter (Signed)
Pt's mother Roshad Tillema [facility] just now mailing out his cloBAZam (ONFI) 10 MG tablet today. Pt will not have any medication to take tonight. Would like know if pt will be alright until medication comes in the mail. Would like a call back to discuss him getting a prescription November for December.

## 2023-10-14 ENCOUNTER — Telehealth: Payer: Self-pay | Admitting: Neurology

## 2023-10-14 NOTE — Telephone Encounter (Signed)
Pt's mother called stating that the pt is about to run out of his  cloBAZam (ONFI) 10 MG tablet and that the insurance will not cover the medication until the 14 of Dec. She would like to know what can be done to get medication for him for two weeks till pt has his appt. Please advise.

## 2023-10-17 NOTE — Telephone Encounter (Signed)
Requested Prescriptions   Pending Prescriptions Disp Refills   cloBAZam (ONFI) 10 MG tablet 96 tablet 3    Sig: Take 1 tablet (10 mg total) by mouth at bedtime.   Last seen 07/06/23 Next appt 03/14/24  Dispenses    Dispensed Days Supply Quantity Provider Pharmacy  clobazam 10 mg tablet 10/13/2023 30 30 tablet Levert Feinstein, MD [facility] Ma...  CLOBAZAM 10 MG TABLET 09/15/2023 30  Levert Feinstein, MD [facility] Mail D...  clobazam 10 mg tablet 09/15/2023 30 30 tablet Levert Feinstein, MD [facility] Ma...  CLOBAZAM 10 MG TABLET 08/17/2023 30  Levert Feinstein, MD [facility] Mail D...  clobazam 10 mg tablet 08/17/2023 30 30 tablet Levert Feinstein, MD [facility] Ma...  CLOBAZAM 10 MG TABLET 07/13/2023 30  Levert Feinstein, MD [facility] Mail D...  clobazam 10 mg tablet 07/13/2023 30 30 tablet Levert Feinstein, MD [facility] Ma...  clobazam 10 mg tablet 06/19/2023 30 30 tablet Levert Feinstein, MD [facility] Mail D...  clobazam 10 mg tablet 05/09/2023 30 30 tablet Levert Feinstein, MD [facility] Ma...  CLOBAZAM 10MG  TABLETS 04/15/2023 30 30 each Levert Feinstein, MD [facility]  CLOBAZAM 10MG  TABLETS 01/16/2023 90 90 each Levert Feinstein, MD [facility]  CLOBAZAM 10MG  TABLETS 10/19/2022 90 90 each Levert Feinstein, MD [facility]      He was given a dispense on 10/13/23 shouldn't be refilled again 11/12/23 looks like he may be taking to much of the medication

## 2023-10-26 ENCOUNTER — Other Ambulatory Visit: Payer: Self-pay | Admitting: Neurology

## 2023-10-26 NOTE — Telephone Encounter (Signed)
PHONE ROOM: Clarify what medications please Thanks,  Monya Kozakiewicz

## 2023-10-26 NOTE — Telephone Encounter (Signed)
Pt's mother called and stated that pt's medications are needing to be sent to [facility] in [place]. Please advise.

## 2023-10-27 MED ORDER — LACOSAMIDE 200 MG PO TABS: 200.0000 mg | ORAL_TABLET | Freq: Two times a day (BID) | ORAL | 3 refills | Status: DC

## 2023-10-27 MED ORDER — BUSPIRONE HCL 15 MG PO TABS: ORAL_TABLET | ORAL | 5 refills | Status: DC

## 2023-10-27 MED ORDER — LAMOTRIGINE ER 300 MG PO TB24: 2.0000 | ORAL_TABLET | Freq: Every day | ORAL | 3 refills | Status: DC

## 2023-10-27 NOTE — Telephone Encounter (Signed)
Requested Prescriptions   Pending Prescriptions Disp Refills   busPIRone (BUSPAR) 15 MG tablet 180 tablet 5    Sig: TAKE 1 TABLET THREE TIMES DAILY   lacosamide (VIMPAT) 200 MG TABS tablet 186 tablet 3    Sig: Take 1 tablet (200 mg total) by mouth 2 (two) times daily.   LamoTRIgine 300 MG TB24 24 hour tablet 93 tablet 3    Sig: Take 2 tablets (600 mg total) by mouth at bedtime.   Last seen 07/06/23 Next appt 03/14/24  Dispenses    Dispensed Days Supply Quantity Provider Pharmacy  LACOSAMIDE 200 MG TABLET 09/16/2023 90  Glean Salvo, NP [facility] Mail D...  lacosamide 200 mg tablet 09/16/2023 90 180 tablet Glean Salvo, NP [facility] Ma...  LACOSAMIDE 200 MG TABLET 06/29/2023 90  Glean Salvo, NP [facility] Mail D...  lacosamide 200 mg tablet 06/29/2023 90 180 tablet Glean Salvo, NP [facility] Ma...  lacosamide 200 mg tablet 04/21/2023 90 180 tablet Glean Salvo, NP [facility] Ma...  LACOSAMIDE 200MG  TABLETS 01/18/2023 90 170 each Levert Feinstein, MD [facility]  LACOSAMIDE 200MG  TABLETS 01/17/2023 5 10 each Levert Feinstein, MD [facility].Marland KitchenMarland Kitchen

## 2023-11-28 ENCOUNTER — Telehealth: Payer: Self-pay | Admitting: Neurology

## 2023-11-28 MED ORDER — CLOBAZAM 10 MG PO TABS: 10.0000 mg | ORAL_TABLET | Freq: Every evening | ORAL | 5 refills | Status: DC

## 2023-11-28 NOTE — Telephone Encounter (Signed)
 I called patients mother. Dr. Onita had refilled Onfi  09/21/23 5 refills to [facility] but they are no longer using that pharmacy. They have switched to [facility] blister packs. He has 1 pill left. I will send a refill. Thanks  Meds ordered this encounter  Medications   cloBAZam  (ONFI ) 10 MG tablet    Sig: Take 1 tablet (10 mg total) by mouth at bedtime.    Dispense:  30 tablet    Refill:  5

## 2023-11-28 NOTE — Telephone Encounter (Signed)
  Last appointment: 07/06/2023 Next appointment: 03/14/2024

## 2023-11-28 NOTE — Telephone Encounter (Signed)
 I called [facility] and spoke with Naya to cancel the patient's Onfi 10 MG refills. The medication and all refills have been discontinued.

## 2023-11-28 NOTE — Telephone Encounter (Signed)
 Pt's mother requesting refill of cloBAZam (ONFI) 10 MG tablet  [facility] to [facility]

## 2023-12-30 ENCOUNTER — Other Ambulatory Visit (HOSPITAL_COMMUNITY): Payer: Self-pay

## 2024-01-03 ENCOUNTER — Telehealth: Payer: Self-pay | Admitting: Neurology

## 2024-01-03 NOTE — Telephone Encounter (Signed)
Pt's mother called [facility] need pa for LamoTRIgine 300 MG TB24 24 hour tablet  and gave me a provider line to call. [employer] requesting MDO form to obtain more information. They said they have been trying to get touch with your office.  [employer] gave a provider line to call [phone]

## 2024-01-04 ENCOUNTER — Other Ambulatory Visit (HOSPITAL_COMMUNITY): Payer: Self-pay

## 2024-01-04 ENCOUNTER — Telehealth: Payer: Self-pay | Admitting: Pharmacy Technician

## 2024-01-04 NOTE — Telephone Encounter (Signed)
Called and spoke to [place] and stated: Refill too soon. PA is not needed at this time. Medication was filled 12/19/2023. Next eligible fill date is 01/11/2024.   Pt mother voiced gratitude and understanding of all discussed

## 2024-01-04 NOTE — Telephone Encounter (Signed)
Pharmacy Patient Advocate Encounter   Received notification from Pt Calls Messages that prior authorization for Lamotrigine 300 mg is required/requested.   Insurance verification completed.   The patient is insured through [employer] .   Per test claim: Refill too soon. PA is not needed at this time. Medication was filled 12/19/2023. Next eligible fill date is 01/11/2024.

## 2024-02-23 ENCOUNTER — Telehealth: Payer: Self-pay | Admitting: Neurology

## 2024-02-23 NOTE — Telephone Encounter (Signed)
 Pt's mother has called to report that a PA is needed for pt's  LamoTRIgine 300 MG TB24 24 hour tablet

## 2024-02-24 ENCOUNTER — Telehealth: Payer: Self-pay

## 2024-02-24 ENCOUNTER — Other Ambulatory Visit (HOSPITAL_COMMUNITY): Payer: Self-pay

## 2024-02-24 NOTE — Telephone Encounter (Signed)
   I called insurance and they stated the PT initiated the PA and they faxed over to [employer] on 01/03/2024 at [phone] fax number a request for clinical information and did not receive. I also did not receive fax in bulk from [facility] for add information so claim was denied. A new claim can not be started until later next week or an appeal can be submitted. I have requested a copy of the denial letter. Call Reference# [id number]

## 2024-02-27 MED ORDER — LAMOTRIGINE ER 300 MG PO TB24: 2.0000 | ORAL_TABLET | Freq: Every day | ORAL | 1 refills | Status: DC

## 2024-02-27 NOTE — Telephone Encounter (Signed)
 Called and spoke to the pts mother and stated:   If the patient is out of medication, we will need to send in a temporary prescription until he gets authorized by insurance.  If he has plenty for a couple weeks they can just do the normal preauthorization process and resubmit next week  If he needs the medication right away, we can use GoodRx and we could send #90 of the 100 mg immediate release lamotrigine tablets to [facility] and they are only $21 for the whole month.  Alternatively we can send in #30 of the 300 mg to the [employer] [facility] and they are $61 for the whole month   She stated to just [facility] the [facility] so I did

## 2024-03-01 ENCOUNTER — Other Ambulatory Visit (HOSPITAL_COMMUNITY): Payer: Self-pay

## 2024-03-01 NOTE — Telephone Encounter (Signed)
   Tried to sybmit again but will only allow for an appeal at this time.She said 60 days so I will try again tomorrow on the 11th that will be 60 days and if not then will likely go thru on Monday.

## 2024-03-05 ENCOUNTER — Other Ambulatory Visit (HOSPITAL_COMMUNITY): Payer: Self-pay

## 2024-03-05 NOTE — Telephone Encounter (Addendum)
 Still getting a rejection even though it has been 60 days since denial-I called insurance and they are now saying that Medicare changed it at the beginning of the year to 65 days, it was denied on Feb the 18th so could not be resubmitted again until March 23rd. I never received the denial letter requested on 02/24/2024-I asked them to refax the denial letter. Once I receive the denial letter I will submit to Pharmacist for an appeal.

## 2024-03-05 NOTE — Telephone Encounter (Signed)
 Plan sent me the cancellation letter and not the denial letter with the appeals information on it-Called insurance and they did finally get me the correct actual denial letter-will forward to Pharmacist for appeals review.

## 2024-03-08 ENCOUNTER — Telehealth: Payer: Self-pay | Admitting: Pharmacist

## 2024-03-08 ENCOUNTER — Other Ambulatory Visit (HOSPITAL_COMMUNITY): Payer: Self-pay

## 2024-03-08 NOTE — Telephone Encounter (Signed)
 Appeal has been submitted for lamotrigine ER tablets. Will advise when response is received, please be advised that most companies may take 30 days to make a decision. Appeal letter and supporting documentation have been faxed to [phone] on 03/08/2024 @2 :55 pm.  Thank you, Dene Fines, PharmD Clinical Pharmacist  [place]  Direct Dial: [phone]

## 2024-03-14 ENCOUNTER — Ambulatory Visit (INDEPENDENT_AMBULATORY_CARE_PROVIDER_SITE_OTHER): Payer: Medicare HMO | Admitting: Neurology

## 2024-03-14 ENCOUNTER — Other Ambulatory Visit (HOSPITAL_COMMUNITY): Payer: Self-pay

## 2024-03-14 ENCOUNTER — Encounter: Payer: Self-pay | Admitting: Neurology

## 2024-03-14 VITALS — BP 128/84 | HR 70 | Ht 69.0 in | Wt 224.0 lb

## 2024-03-14 DIAGNOSIS — G40209 Localization-related (focal) (partial) symptomatic epilepsy and epileptic syndromes with complex partial seizures, not intractable, without status epilepticus: Secondary | ICD-10-CM | POA: Diagnosis not present

## 2024-03-14 DIAGNOSIS — G629 Polyneuropathy, unspecified: Secondary | ICD-10-CM | POA: Diagnosis not present

## 2024-03-14 DIAGNOSIS — F819 Developmental disorder of scholastic skills, unspecified: Secondary | ICD-10-CM

## 2024-03-14 DIAGNOSIS — R269 Unspecified abnormalities of gait and mobility: Secondary | ICD-10-CM

## 2024-03-14 NOTE — Patient Instructions (Signed)
 Great to see you today.  We will continue current medications.  Continue to exercise and be active.  Call for recurrent frequent seizure.  Follow-up in 8 months.  Thanks!!

## 2024-03-14 NOTE — Progress Notes (Signed)
 HISTORY  Mr. Mandich, 53 year old male returns for followup. He is with his mother. He has a history of intractable seizure disorder since 53 years old. He has been followed in the office since 1990. 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   He has had EMU monitoring at [facility] in the past that included abnormal baseline EEG due to intermittent predominantly right temporal spike, sharp right,and focal slowing. He continues to refuse surgery to remove irritable focus.   Over the years, he has tried different combination, he has been on current medications   Topamax  100 mg twice a day, +50 mg twice a day, Dilantin  100 mg 2 tablets twice a day, Keppra  500 mg 3 tablets b.i.d, brand name    He is also taking Buspirone  10 mg t.i.d., citalopram 20 mg every day for his depression and angry control prescribed by psychiatry.   He continues to have 2-3 seizures every day, complex partial with secondary generalization, he had burst open few helmet due to his seizure, he continued to smoke marijuana and cigarette. He helps his mother taking care of his elderly bed ridden grandmother at home, has raging spells sometimes.    Vimpat  150 twice a day was added since 2015,    He still has seizure almost daily,   He is now taking Topamax  100 plus 50 mg twice a day , Dilantin  200 mg twice a day, Keppra  500 mg 3 tablets twice a day, Vimpat  150 mg twice a day   UPDATE February 07 2017: He came here urgently in early February 2018 because bilateral hands shaking, he was given the prescription of Vraylar , his shaking has much improved after stop the medication.   He is going through dental procedure, his mother had POA, they agreed to proceed with VNS   UPDATE June 09 2017: His seizure overall has much improved, is no longer having a daily basis, is tolerating current medications, but yesterday on June 08 2017, he had few recurrent seizures, he is still going through dental procedures,   He is  taking Keppra  500 mg 3 tablets, Vimpat  150 mg 2 tablets, topiramate  100 mg 2 tablets twice a day   UPDATE Dec 12 2017: His seizure overall has improved some, he also goes to [employer] [facility], taking buspiprone, which has helped his mood some,   Continue have frequent migraine headaches, 3-4 times each week, sometimes multiple episode in the day,   He smokes cigarettes some, mother has convinced him to stop use marijuana.   He continue have significant depression, after discussed with mother and patient, we decided to switch Keppra  to lamotrigine ,   We also talked about VNS placement, he wants to hold off    UPDATE March 20 2018: He is doing much better now with current combinations, lamotrigine  100 mg 2 tablets twice a day, Vimpat  150 mg 2 tablets twice a day, Topamax  100 mg 2 tablets twice a day   Only few recurrent seizure, much better than his previous baseline his mood has improved as well   UPDATE July 20 2018: He is doing much better now with current combination of Vimpat  150 mg 2 tablets twice a day, lamotrigine  100 mg 3 tablets twice a day, Topamax  100 mg twice a day, he only has seizure once or twice each week, they are short lasting, sometimes without recurrent seizure in 1 week, this is in contrast to previous daily multiple seizure episode,   But he complains of dizziness, lightheadedness after each dose  of medications,   His depression has much improved too, is more compliant with his mother   EEG in April 2019 showed generalized epileptiform discharge in the background of mild dysrhythmic slowing.   Update October 04, 2018 He is accompanied by his mother at today's visit, he presented to emergency room on September 24, 2018 after found falling down at home, he has no recollection of the event, mother thought he might have a seizure, his seizure overall is under excellent control, tolerating lamotrigine  XR 300 mg 2 tablets at nighttime, Vimpat  2 tablets twice a day,  his mood is also under good control.   I personally reviewed MRI of brain: Advanced atrophy of the cerebellum, likely secondary to chronic antiepileptic medication MRI of cervical spine: Multilevel degenerative changes, moderate to severe foraminal narrowing at C3-4, C7-T1, secondary to multilevel uncovertebral disease, there is no evidence of canal stenosis.   Patient complains slow worsening gait abnormality over the past few months, urinary frequency, urgency, chronic low back pain, also mild neck pain radiating pain to right hand, arm   Virtual Visit via Phone March 14 2019: I interviewed patient and his mother, his seizure is under very good control, only had 3 seizure and few staring spells over past 4 months.   He continues to have gait abnormalities, using walker, right arm weakness, no incontinence.   EMG/NCS on Nov 10 2018: showed evidence of active right cervical radiculopathy, involving right C7,8, T1, moderate C6 myotomes.    He was seen by neurosurgeon, not a surgical candidate.    UPDATE July 12 2019: He is doing very well from a seizure standpoint with current combination lamotrigine  ER 300 mg 2 tablets at bedtime, Vimpat  200 mg 2 tablets twice a day, he had minor staring spells about once a week, rarely has generalized seizures   Continue have significant right hand weakness, but not progressing, continue to have gait abnormality,   Mother reported few episode of sudden onset generalized weakness, could not walk, left arm against gravity, most recent episode was July 10, 2019, he has got up taking his morning medications, he noticed sudden onset weakness, he was able to call mother to check on it, episode lasted about few hours, gradually improved   Reviewed emergency room presentation on November 20 2018, similar episode on September 24, 2018, during the spells, he was able to move his toes and fingers, but very slowly, laboratory evaluations December showed negative  troponin, normal CMP, with glucose of 99, normal CBC,   UPDATE Jan 15 2020: He is overall doing very well, there was no recurrent generalized tonic-clonic seizure, but he has staring spells, transient unresponsiveness almost daily basis, he no longer has to wear his helmet, continue have right hand weakness, gait difficulty has improved   UPDATE February 18 2020: He was seen by neurosurgeon Dr. Nat Badger, deemed to be a good candidate for vagal nerve stimulation, however, patient have a tendency to back-and-forth with this decision, all VNS related question was answered, despite polypharmacy large dose of Vimpat  400 mg twice a day, lamotrigine  600 mg daily, he continues to have frequent spells of "small seizures", staring into space, unresponsive for 1 minutes,  this is a significant compared to previous frequent drop attacks, he has to wear helmet in the past,   He continue have significant right more than left hand weakness, gait abnormality, he denies significant sensory changes   He came in today for repeat electrodiagnostic study today, which showed 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  I will refer him to fluoroscopy guided lumbar puncture   I again personally reviewed MRIs in 2019, MRI of cervical spine/brain: No acute abnormality, advanced atrophy of cerebellum likely secondary to chronic antielliptic medication therapy, multilevel cervical degenerative changes, evidence of significant canal stenosis, moderate to severe at bilateral C4-5, C5-6, C6-7, C7-T1 MRI of lumbar spine showed mild degenerative changes, no significant canal or foraminal narrowing.   UPDATE Sep 04 2020: His  mother reported 3 episode of patient woke up in the morning feeling body weakness, slurred speech, but was able to communicate clearly his idea, lasting for hours, gradually resolved, there was no loss of consciousness, this is different from any of his spells in the past,   He has no generalized tonic-clonic seizure, taking Vimpat  200 mg 2 tablets twice a day, lamotrigine  xr24 hours 300 mg 2 tablets every night,   UPDATE June 02 2021: He is accompanied by his mother at today's visit, overall doing very well, only he has couple staring off spells each week, tolerating lamotrigine  xr 300 mg 2 tablets every night, Vimpat  200 mg 2 tablets twice a day,   Continue has right hand muscle atrophy, weakness, this started gradually since 2019, no significant progression, no sensory component, 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 Remains on Vimpat  and Lamictal .   UPDATE July 25th 2023 Overall very stable with his current medications, rarely has small seizure, short lasting, continue have right hand atrophy, gait has improved,  Repeat EEG in June 2023 was normal  Currently taking clobazam  10 mg every night, Vimpat  200 mg twice a day, lamotrigine  ER 300 mg 2 tablets every night level was 10.3, lacosamide  level was 10.5, normal CMP CBC,  Update December 28, 2022 SS: Seizures doing well, episodes of short spells, turning head to the right, mom will redirect him, it goes away. Happens few times a month. On onfi  10 mg BID, Vimpat  200  mg BID, will switch to generic, Lamictal  XR 600 mg at bedtime. Labs June 2023 normal CBC CMP, Lamictal  10.3, Vimpat  10.5. Balance is better.   Update July 06, 2023 SS: Here with mom, doing well, seizures very brief, stare off few seconds, becoming less frequent/less intense, bounces back quickly. Getting around okay, no falls. On Onfi  10 mg at bedtime, Vimpat  200 mg BID, Lamictal  300 mg XR 2 at bedtime. On Buspar  15 mg daily 3 times daily. Seeing PCP, doing well, going to get some counseling.   Update March 14, 2024 SS: Labs last visit, CBC, CMP normal. Vimpat  8.3, Lamictal  12.6. Remains on stable dose of Onfi  10 mg at bedtime, Vimpat  200 mg BID, Lamictal  XR 300 mg, 2 tablets daily. Continues with small brief seizure episodes few times a month. He gets pill packs. He went to some anger  management, mom felt not needed. Looking into adult day program. He plays video games. Balance is better.   REVIEW OF SYSTEMS: Out of a complete 14 system review of symptoms, the patient complains only of the following symptoms, and all other reviewed systems are negative.  See HPI  ALLERGIES: Allergies  Allergen Reactions   Other     IVIG - hypertension     HOME MEDICATIONS: Outpatient Medications Prior to Visit  Medication Sig Dispense Refill   acetaminophen  (TYLENOL ) 325 MG tablet Take 325 mg by mouth every 6 (six) hours as needed for moderate pain.     amLODipine (NORVASC) 10 MG tablet Take 10 mg by mouth daily.     atorvastatin (LIPITOR) 10 MG tablet Take 10 mg by mouth daily.     busPIRone  (BUSPAR ) 15 MG tablet TAKE 1 TABLET THREE TIMES DAILY 180 tablet 5   Cholecalciferol (VITAMIN D3) 50 MCG (2000 UT) capsule Take 2,000 Units by mouth daily.     cloBAZam  (ONFI ) 10 MG tablet Take 1 tablet (10 mg total) by mouth at bedtime. 30 tablet 5   Cyanocobalamin (VITAMIN B-12 PO) Take 1 tablet by mouth daily.     docusate sodium (COLACE) 100 MG capsule Take 100 mg by mouth daily as needed for mild constipation.      dutasteride (AVODART) 0.5 MG capsule Take 0.5 mg by mouth every morning.     fluticasone  (FLONASE ) 50 MCG/ACT nasal spray Place 1 spray into both nostrils daily as needed for allergies or rhinitis.     lacosamide  (VIMPAT ) 200 MG TABS tablet Take 1 tablet (200 mg total) by mouth 2 (two) times daily. 186 tablet 3   LamoTRIgine  300 MG TB24 24 hour tablet Take 2 tablets (600 mg total) by mouth at bedtime. 180 tablet 1   losartan (COZAAR) 25 MG tablet Take 25 mg by mouth daily.     tamsulosin (FLOMAX) 0.4 MG CAPS capsule Take 0.4 mg by mouth daily.     UNABLE TO FIND Take 1 tablet by mouth daily. Med Name: [place] + Vitamin C     zinc gluconate 50 MG tablet Take 50 mg by mouth daily.     No facility-administered medications prior to visit.    PAST MEDICAL HISTORY: Past Medical History:  Diagnosis Date   Anxiety    Depression    Seizures (HCC)     PAST SURGICAL HISTORY: Past Surgical History:  Procedure Laterality Date   COLONOSCOPY WITH PROPOFOL  N/A 07/09/2021   Procedure: COLONOSCOPY WITH PROPOFOL ;  Surgeon: Nannette Babe, MD;  Location: [facility];  Service: Gastroenterology;  Laterality: N/A;   none     POLYPECTOMY  07/09/2021   Procedure: POLYPECTOMY;  Surgeon: Nannette Babe, MD;  Location: [facility];  Service: Gastroenterology;;    FAMILY HISTORY: Family History  Problem Relation Age of Onset   Asthma Father     SOCIAL HISTORY: Social History   Socioeconomic History   Marital status: Single    Spouse name: Not on file   Number of children: 0   Years of education: 12   Highest education level: Not on file  Occupational History    Comment: Disabled  Tobacco Use   Smoking status: Former    Current packs/day: 0.50    Average packs/day: 0.5 packs/day for 20.0 years (10.0 ttl pk-yrs)    Types: Cigarettes   Smokeless tobacco: Never  Vaping Use   Vaping status: Never Used  Substance and Sexual Activity   Alcohol use:  No    Alcohol/week: 0.0 standard drinks of  alcohol   Drug use: No    Comment: 11/08/16 - reports he has stopped smoking marijuana.   Sexual activity: Not on file  Other Topics Concern   Not on file  Social History Narrative   Patient lives at home with his mother Tyshawn Ciullo ). Patient is disabled. Patient has high school education.   Left handed.   Caffeine- soda - pepsi four cans daily.   Patients father died of over dose.-40   Social Drivers of Corporate investment banker Strain: Not on File (03/11/2022)   [facility] [employer] [facility]   [employer]    Financial Resource Strain: 0  Food Insecurity: Not on File (08/18/2023)   Received from [employer]    Food: 0  Transportation Needs: Not on File (03/11/2022)   Received from [employer], [employer] Needs    Transportation: 0  Physical Activity: Not on File (03/11/2022)   Received from [place], [place]   Physical Activity    Physical Activity: 0  Stress: Not on File (03/11/2022)   Received from [facility], [place]   Stress    Stress: 0  Social Connections: Not on File (08/06/2023)   Received from [employer]   Social Connections    Connectedness: 0  Intimate Partner Violence: Not on file   PHYSICAL EXAM  Vitals:   03/14/24 1321  BP: 128/84  Pulse: 70  Weight: 224 lb (101.6 kg)  Height: 5\' 9"  (1.753 m)    Body mass index is 33.08 kg/m.  Generalized: Well-appearing, well-dressed Neurological examination  Mentation: Alert oriented to time, place, mom assists with history. Follows all commands speech and language fluent Cranial nerve II-XII: Pupils were equal round reactive to light. Extraocular movements were full, visual field were full on confrontational test. Facial sensation and strength were normal.  Head turning and shoulder shrug were normal and symmetric. Motor: Significant right hand atrophy, decreased strength to right hand with grip strength, right arm strength 3/5, atrophy to right forearm Sensory: Intact to soft touch to face, arms,  legs Coordination: Cerebellar testing reveals good finger-nose-finger and heel-to-shin bilaterally.  Gait and station: Needs push-up to get up from sitting position, cautious wide-based, right arm tends to stay by side Reflexes: Areflexia  DIAGNOSTIC DATA (LABS, IMAGING, TESTING) - I reviewed patient records, labs, notes, testing and imaging myself where available.  Lab Results  Component Value Date   WBC 6.1 07/06/2023   HGB 15.1 07/06/2023   HCT 44.4 07/06/2023   MCV 83 07/06/2023   PLT 253 07/06/2023      Component Value Date/Time   NA 144 07/06/2023 1509   K 4.5 07/06/2023 1509   CL 108 (H) 07/06/2023 1509   CO2 22 07/06/2023 1509   GLUCOSE 91 07/06/2023 1509   GLUCOSE 81 05/21/2021 1123   BUN 10 07/06/2023 1509   CREATININE 1.17 07/06/2023 1509   CALCIUM 9.2 07/06/2023 1509   PROT 7.2 07/06/2023 1509   ALBUMIN 4.3 07/06/2023 1509   AST 16 07/06/2023 1509   ALT 29 07/06/2023 1509   ALKPHOS 88 07/06/2023 1509   BILITOT 0.4 07/06/2023 1509   GFRNONAA 69 09/04/2020 1205   GFRAA 80 09/04/2020 1205   No results found for: "CHOL", "HDL", "LDLCALC", "LDLDIRECT", "TRIG", "CHOLHDL" Lab Results  Component Value Date   HGBA1C 5.9 (H) 09/04/2020   Lab Results  Component Value Date   VITAMINB12 310 07/12/2019   Lab Results  Component Value Date   TSH 1.240 09/04/2020   ASSESSMENT AND PLAN 53 y.o. 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 Epilepsy, intellectual disability  -Doing very well,  less frequent spells, shorter in duration, tolerating seizure medications -Continue lamotrigine  ER 300 mg, 2 tablets at bedtime -Continue lacosamide  200 mg, 1 tablets twice a day -Continue to Onfi  10mg  qhs  -Has previously seen Dr. Nat Badger, determined candidate for VNS, he is not interested  -Blood work looked good in August 2024  3.  Mood disorder -Stable on BuSpar  15 mg 3 times a day, PCP has referred to counseling  Follow-up with Dr. Gracie Lav in 8 months to rotate visits with me.  Cortland Ding, DNP  [facility] [address] [place], [place] [address] [phone]

## 2024-03-14 NOTE — Telephone Encounter (Signed)
 Appeal for lamotrigine  ER 300 mg tablets has been approved by the insurance through 11/21/2024:

## 2024-03-15 NOTE — Telephone Encounter (Signed)
 Call to mother, she is aware that Lamotrigine  was approved. Appreciative of call

## 2024-04-18 ENCOUNTER — Other Ambulatory Visit: Payer: Self-pay

## 2024-04-18 NOTE — Telephone Encounter (Signed)
 Requested Prescriptions   Pending Prescriptions Disp Refills   cloBAZam  (ONFI ) 10 MG tablet 30 tablet 5    Sig: Take 1 tablet (10 mg total) by mouth at bedtime.   Last seen 03/14/24, next appt 11/06/24 Dispenses   Dispensed Days Supply Quantity Provider Pharmacy  CLOBAZAM   10 MG TABS 04/13/2024 30 30 tablet Wess Hammed, NP [facility]  clobazam  10 mg tablet 03/20/2024 30 30 each Wess Hammed, NP [facility]  clobazam  10 mg tablet 02/23/2024 30 30 each Wess Hammed, NP [facility]  clobazam  10 mg tablet 01/26/2024 30 30 each Wess Hammed, NP [facility]  clobazam  10 mg tablet 12/27/2023 30 30 each Wess Hammed, NP [facility]  clobazam  10 mg tablet 11/28/2023 30 30 each Wess Hammed, NP [facility]  CLOBAZAM  10MG  TABLETS 11/21/2023 6 6 each Wess Hammed, NP [facility]...    Refill refused

## 2024-04-23 ENCOUNTER — Emergency Department (HOSPITAL_COMMUNITY)
Admission: EM | Admit: 2024-04-23 | Discharge: 2024-04-23 | Disposition: A | Discharge status: Home / Self Care | Attending: Emergency Medicine | Admitting: Emergency Medicine

## 2024-04-23 ENCOUNTER — Other Ambulatory Visit: Payer: Self-pay

## 2024-04-23 ENCOUNTER — Encounter (HOSPITAL_COMMUNITY): Payer: Self-pay | Admitting: *Deleted

## 2024-04-23 DIAGNOSIS — J069 Acute upper respiratory infection, unspecified: Secondary | ICD-10-CM | POA: Insufficient documentation

## 2024-04-23 DIAGNOSIS — H6122 Impacted cerumen, left ear: Secondary | ICD-10-CM | POA: Diagnosis not present

## 2024-04-23 DIAGNOSIS — H60313 Diffuse otitis externa, bilateral: Secondary | ICD-10-CM | POA: Insufficient documentation

## 2024-04-23 DIAGNOSIS — R059 Cough, unspecified: Secondary | ICD-10-CM | POA: Diagnosis present

## 2024-04-23 MED ORDER — CIPROFLOXACIN-DEXAMETHASONE 0.3-0.1 % OT SUSP: 4.0000 [drp] | Freq: Two times a day (BID) | OTIC | 0 refills | Status: AC

## 2024-04-23 NOTE — ED Provider Notes (Signed)
 [facility] Provider Note   CSN: [id number] Arrival date & time: 04/23/24  5573     History  Chief Complaint  Patient presents with   Ear Fullness    Andrew [name] is a 53 y.o. male presenting for primary concern of muffled hearing that began 3 days prior.  Initially more on the left however states that muffled hearing is bilateral.  Further endorses ear discomfort more on the right however does notice ear discomfort on the left.  He has no complaint of increased feeling of being off balance, also has cough for the last 3 days and sore throat over the last 3 days.  Further endorses nasal congestion over the same timeframe.  Per EMS report, there was a concern over fever however presentation his temperature is within normal range at 98.7 Fahrenheit.  Patient also has an unassociated complaint of constipation.  States that his last bowel movement was approximately 3 days prior.  He admits that this is a frequent issue and that typically it is resolved with physical activity.  Currently does not take anything for constipation.   Ear Fullness Associated symptoms include headaches. Pertinent negatives include no shortness of breath.       Home Medications Prior to Admission medications   Medication Sig Start Date End Date Taking? Authorizing Provider  acetaminophen  (TYLENOL ) 325 MG tablet Take 325 mg by mouth every 6 (six) hours as needed for moderate pain.    [provider]  amLODipine (NORVASC) 10 MG tablet Take 10 mg by mouth daily.    [provider]  atorvastatin (LIPITOR) 10 MG tablet Take 10 mg by mouth daily. 11/12/21   [provider]  busPIRone  (BUSPAR ) 15 MG tablet TAKE 1 TABLET THREE TIMES DAILY 10/27/23   Phebe Brasil, MD  Cholecalciferol (VITAMIN D3) 50 MCG (2000 UT) capsule Take 2,000 Units by mouth daily.    [provider]  cloBAZam  (ONFI ) 10 MG tablet Take 1 tablet (10 mg total) by mouth at  bedtime. 11/28/23   Wess Hammed, NP  Cyanocobalamin (VITAMIN B-12 PO) Take 1 tablet by mouth daily.    [provider]  docusate sodium (COLACE) 100 MG capsule Take 100 mg by mouth daily as needed for mild constipation.    [provider]  dutasteride (AVODART) 0.5 MG capsule Take 0.5 mg by mouth every morning. 04/02/22   [provider]  fluticasone  (FLONASE ) 50 MCG/ACT nasal spray Place 1 spray into both nostrils daily as needed for allergies or rhinitis.    [provider]  lacosamide  (VIMPAT ) 200 MG TABS tablet Take 1 tablet (200 mg total) by mouth 2 (two) times daily. 10/27/23   Phebe Brasil, MD  LamoTRIgine  300 MG TB24 24 hour tablet Take 2 tablets (600 mg total) by mouth at bedtime. 02/27/24   Wess Hammed, NP  losartan (COZAAR) 25 MG tablet Take 25 mg by mouth daily.    [provider]  tamsulosin (FLOMAX) 0.4 MG CAPS capsule Take 0.4 mg by mouth daily. 04/02/22   [provider]  UNABLE TO FIND Take 1 tablet by mouth daily. Med Name: [place] + Vitamin C    [provider]  zinc gluconate 50 MG tablet Take 50 mg by mouth daily.    [provider]      Allergies    Other    Review of Systems   Review of Systems  Constitutional:  Negative for chills and fatigue.  Respiratory:  Positive for cough. Negative for shortness of breath.   Gastrointestinal:  Negative for nausea and vomiting.  Neurological:  Positive for dizziness and headaches. Negative for tremors, syncope and weakness.  All other systems reviewed and are negative.   Physical Exam Updated Vital Signs BP (!) 153/98 (BP Location: Right Arm)   Pulse 79   Temp 98.7 F (37.1 C)   Resp (!) 22   Ht 5\' 9"  (1.753 m)   Wt 103 kg   SpO2 97%   BMI 33.52 kg/m  Physical Exam Vitals and nursing note reviewed.  Constitutional:      General: He is not in acute distress.    Appearance: Normal appearance.  HENT:     Head: Normocephalic and atraumatic.      Right Ear: Tenderness present.     Left Ear: Tenderness present. There is impacted cerumen.     Ears:     Comments: Maceration noted to the right external canal.    Mouth/Throat:     Mouth: Mucous membranes are moist.     Pharynx: Oropharynx is clear.  Eyes:     Extraocular Movements: Extraocular movements intact.     Conjunctiva/sclera: Conjunctivae normal.     Pupils: Pupils are equal, round, and reactive to light.  Cardiovascular:     Rate and Rhythm: Normal rate and regular rhythm.     Pulses: Normal pulses.     Heart sounds: Normal heart sounds. No murmur heard.    No friction rub. No gallop.  Pulmonary:     Effort: Pulmonary effort is normal.     Breath sounds: Normal breath sounds and air entry.  Abdominal:     General: Abdomen is flat. Bowel sounds are normal.     Palpations: Abdomen is soft.  Musculoskeletal:        General: Normal range of motion.     Cervical back: Normal range of motion and neck supple.     Right lower leg: No edema.     Left lower leg: No edema.  Skin:    General: Skin is warm and dry.     Capillary Refill: Capillary refill takes less than 2 seconds.  Neurological:     General: No focal deficit present.     Mental Status: He is alert. Mental status is at baseline.  Psychiatric:        Mood and Affect: Mood normal.        Behavior: Behavior is cooperative.     ED Results / Procedures / Treatments   Labs (all labs ordered are listed, but only abnormal results are displayed) Labs Reviewed - No data to display  EKG None  Radiology No results found.  Procedures Procedures    Medications Ordered in ED Medications - No data to display  ED Course/ Medical Decision Making/ A&P                                 Medical Decision Making Risk Prescription drug management.   Medical Decision Making:   Andrew [name] is a 53 y.o. male who presented to the ED today with ear fullness and decreased hearing over the last 3 days detailed  above.     Complete initial physical exam performed, notably the patient  was alert and oriented without any apparent distress.  Inspection of the bilateral ears shows that the right external canal has some cerumen, difficult to visualize  TM, external canal feels macerated.  Left external canal is obstructed by impacted cerumen.  No signs of purulent lymphadenopathy noted however does have tenderness to palpation at the angle of the mandible.  Posterior pharynx is edematous however no tonsillar exudates or edema noted.     Reviewed and confirmed nursing documentation for past medical history, family history, social history.    Initial Assessment:   With the patient's presentation of bilateral ear fullness, most likely diagnosis is cerumen impaction as well as otitis externa. Other diagnoses were considered including (but not limited to) otitis media, eustachian tube dysfunction. These are considered less likely due to history of present illness and physical exam findings.     Initial Plan:  Irrigation of the bilateral ears to clear out impacted cerumen Prescription of topical antibiotics and steroids to manage ear discomfort, likely otitis externa Objective evaluation as below reviewed    Reassessment and Plan:   In regards to otitis externa, prescribed Ciprodex drops and have patient complete course of this and follow-up with primary care for assessment of resolution and continued management.  In regards to concerns of constipation, will prescribe daily MiraLAX and again have him readdress this with his primary care.  Irrigation attempted bilateral ears, per nursing report there is no resolution of the impaction however cerumen is still notable on exam.  As such, we will continue to manage otitis externa with Ciprodex, refer to ENT for management of cerumen impaction.          Final Clinical Impression(s) / ED Diagnoses Final diagnoses:  None    Rx / DC Orders ED Discharge Orders      None         Juanetta Nordmann, PA 04/23/24 2130    Auston Blush, MD 04/23/24 [phone]

## 2024-04-23 NOTE — ED Triage Notes (Signed)
 Patient presents to ed via [facility] c/o muffled hearing  in his left onset fri. States now it is both ears. C/o nasal congestion . Patient was given Tylenol  650 mg per ems because they thought he felt hot to touch

## 2024-04-23 NOTE — Discharge Instructions (Addendum)
 As we discussed, recommend that you take daily MiraLAX to aid with your constipation.  Please schedule appointment with the ENT specialist provided to have the earwax evaluated and managed.

## 2024-05-15 ENCOUNTER — Other Ambulatory Visit: Payer: Self-pay

## 2024-05-15 MED ORDER — LACOSAMIDE 200 MG PO TABS: 200.0000 mg | ORAL_TABLET | Freq: Two times a day (BID) | ORAL | 3 refills | Status: DC

## 2024-05-15 NOTE — Telephone Encounter (Signed)
 Requested Prescriptions   Pending Prescriptions Disp Refills   lacosamide  (VIMPAT ) 200 MG TABS tablet 186 tablet 3    Sig: Take 1 tablet (200 mg total) by mouth 2 (two) times daily.   Last seen 03/14/24 Next appt 11/06/24  Dispenses   Dispensed Days Supply Quantity Provider Pharmacy  lacosamide  200 mg tablet 04/13/2024 30 60 each Onita Duos, MD [facility]  lacosamide  200 mg tablet 03/20/2024 30 60 each Onita Duos, MD [facility]  lacosamide  200 mg tablet 02/23/2024 30 60 each Onita Duos, MD [facility]  lacosamide  200 mg tablet 01/26/2024 30 60 each Onita Duos, MD [facility]  lacosamide  200 mg tablet 12/19/2023 30 60 each Onita Duos, MD [email or web address]

## 2024-05-16 ENCOUNTER — Other Ambulatory Visit: Payer: Self-pay

## 2024-05-16 MED ORDER — CLOBAZAM 10 MG PO TABS: 10.0000 mg | ORAL_TABLET | Freq: Every evening | ORAL | 5 refills | Status: DC

## 2024-05-16 NOTE — Telephone Encounter (Addendum)
 LVM at 11:53 am: [facility] We sent a refill request for medication for mutual patient to Sarah, NP and have not gotten a response; calling to follow up. For clobazam  10 mg once at bedtime. We do bubble pack for patient and missing this one medication to complete his pack. If you could send us  the prescription. Can contact at [phone], can fax to [phone]

## 2024-05-16 NOTE — Telephone Encounter (Signed)
 Requested Prescriptions   Pending Prescriptions Disp Refills   cloBAZam  (ONFI ) 10 MG tablet 30 tablet 5    Sig: Take 1 tablet (10 mg total) by mouth at bedtime.   Last seen 03/14/24 Next appt 11/06/24 Dispenses   Dispensed Days Supply Quantity Provider Pharmacy  clobazam  10 mg tablet 04/13/2024 30 30 each Gayland Lauraine PARAS, NP [facility]  clobazam  10 mg tablet 03/20/2024 30 30 each Gayland Lauraine PARAS, NP [facility]  clobazam  10 mg tablet 02/23/2024 30 30 each Gayland Lauraine PARAS, NP [facility]  clobazam  10 mg tablet 01/26/2024 30 30 each Gayland Lauraine PARAS, NP [facility]  clobazam  10 mg tablet 12/27/2023 30 30 each Gayland Lauraine PARAS, NP [facility]  clobazam  10 mg tablet 11/28/2023 30 30 each Gayland Lauraine PARAS, NP [facility]  CLOBAZAM  10MG  TABLETS 11/21/2023 6 6 each Gayland Lauraine PARAS, NP [email or web address]

## 2024-10-12 ENCOUNTER — Other Ambulatory Visit: Payer: Self-pay

## 2024-10-12 MED ORDER — BUSPIRONE HCL 15 MG PO TABS: ORAL_TABLET | ORAL | 5 refills | Status: AC

## 2024-11-06 ENCOUNTER — Ambulatory Visit: Admitting: Neurology

## 2024-11-06 ENCOUNTER — Encounter: Payer: Self-pay | Admitting: Neurology

## 2024-11-06 VITALS — BP 130/79 | HR 62 | Ht 68.0 in | Wt 222.0 lb

## 2024-11-06 DIAGNOSIS — R29898 Other symptoms and signs involving the musculoskeletal system: Secondary | ICD-10-CM

## 2024-11-06 DIAGNOSIS — G40209 Localization-related (focal) (partial) symptomatic epilepsy and epileptic syndromes with complex partial seizures, not intractable, without status epilepticus: Secondary | ICD-10-CM | POA: Diagnosis not present

## 2024-11-06 DIAGNOSIS — F3160 Bipolar disorder, current episode mixed, unspecified: Secondary | ICD-10-CM

## 2024-11-06 DIAGNOSIS — R269 Unspecified abnormalities of gait and mobility: Secondary | ICD-10-CM

## 2024-11-06 MED ORDER — LACOSAMIDE 200 MG PO TABS: 200.0000 mg | ORAL_TABLET | Freq: Two times a day (BID) | ORAL | 1 refills | Status: AC

## 2024-11-06 MED ORDER — CLOBAZAM 10 MG PO TABS: 10.0000 mg | ORAL_TABLET | Freq: Every evening | ORAL | 5 refills | Status: AC

## 2024-11-06 MED ORDER — LAMOTRIGINE ER 300 MG PO TB24: 2.0000 | ORAL_TABLET | Freq: Every day | ORAL | 3 refills | Status: AC

## 2024-11-06 NOTE — Progress Notes (Signed)
 HISTORY  Andrew [name], 53 year old male returns for followup. He is with his mother. He has a history of intractable seizure disorder since 53 years old. He has been followed in the office since 1990. 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   He has had EMU monitoring at [facility] in the past that included abnormal baseline EEG due to intermittent predominantly right temporal spike, sharp right,and focal slowing. He continues to refuse surgery to remove irritable focus.   Over the years, he has tried different combination, he has been on current medications   Topamax  100 mg twice a day, +50 mg twice a day, Dilantin  100 mg 2 tablets twice a day, Keppra  500 mg 3 tablets b.i.d, brand name    He is also taking Buspirone  10 mg t.i.d., citalopram 20 mg every day for his depression and angry control prescribed by psychiatry.   He continues to have 2-3 seizures every day, complex partial with secondary generalization, he had burst open few helmet due to his seizure, he continued to smoke marijuana and cigarette. He helps his mother taking care of his elderly bed ridden grandmother at home, has raging spells sometimes.    Vimpat  150 twice a day was added since 2015,    He still has seizure almost daily,   He is now taking Topamax  100 plus 50 mg twice a day , Dilantin  200 mg twice a day, Keppra  500 mg 3 tablets twice a day, Vimpat  150 mg twice a day   UPDATE February 07 2017: He came here urgently in early February 2018 because bilateral hands shaking, he was given the prescription of Vraylar , his shaking has much improved after stop the medication.   He is going through dental procedure, his mother had POA, they agreed to proceed with VNS   UPDATE June 09 2017: His seizure overall has much improved, is no longer having a daily basis, is tolerating current medications, but yesterday on June 08 2017, he had few recurrent seizures, he is still going through dental procedures,   He is  taking Keppra  500 mg 3 tablets, Vimpat  150 mg 2 tablets, topiramate  100 mg 2 tablets twice a day   UPDATE Dec 12 2017: His seizure overall has improved some, he also goes to [facility], taking buspiprone, which has helped his mood some,   Continue have frequent migraine headaches, 3-4 times each week, sometimes multiple episode in the day,   He smokes cigarettes some, mother has convinced him to stop use marijuana.   He continue have significant depression, after discussed with mother and patient, we decided to switch Keppra  to lamotrigine ,   We also talked about VNS placement, he wants to hold off    UPDATE March 20 2018: He is doing much better now with current combinations, lamotrigine  100 mg 2 tablets twice a day, Vimpat  150 mg 2 tablets twice a day, Topamax  100 mg 2 tablets twice a day   Only few recurrent seizure, much better than his previous baseline his mood has improved as well   UPDATE July 20 2018: He is doing much better now with current combination of Vimpat  150 mg 2 tablets twice a day, lamotrigine  100 mg 3 tablets twice a day, Topamax  100 mg twice a day, he only has seizure once or twice each week, they are short lasting, sometimes without recurrent seizure in 1 week, this is in contrast to previous daily multiple seizure episode,   But he complains of dizziness, lightheadedness after each dose  of medications,   His depression has much improved too, is more compliant with his mother   EEG in April 2019 showed generalized epileptiform discharge in the background of mild dysrhythmic slowing.   Update October 04, 2018 He is accompanied by his mother at today's visit, he presented to emergency room on September 24, 2018 after found falling down at home, he has no recollection of the event, mother thought he might have a seizure, his seizure overall is under excellent control, tolerating lamotrigine  XR 300 mg 2 tablets at nighttime, Vimpat  2 tablets twice a day,  his mood is also under good control.   I personally reviewed MRI of brain: Advanced atrophy of the cerebellum, likely secondary to chronic antiepileptic medication MRI of cervical spine: Multilevel degenerative changes, moderate to severe foraminal narrowing at C3-4, C7-T1, secondary to multilevel uncovertebral disease, there is no evidence of canal stenosis.   Patient complains slow worsening gait abnormality over the past few months, urinary frequency, urgency, chronic low back pain, also mild neck pain radiating pain to right hand, arm   Virtual Visit via Phone March 14 2019: I interviewed patient and his mother, his seizure is under very good control, only had 3 seizure and few staring spells over past 4 months.   He continues to have gait abnormalities, using walker, right arm weakness, no incontinence.   EMG/NCS on Nov 10 2018: showed evidence of active right cervical radiculopathy, involving right C7,8, T1, moderate C6 myotomes.    He was seen by neurosurgeon, not a surgical candidate.    UPDATE July 12 2019: He is doing very well from a seizure standpoint with current combination lamotrigine  ER 300 mg 2 tablets at bedtime, Vimpat  200 mg 2 tablets twice a day, he had minor staring spells about once a week, rarely has generalized seizures   Continue have significant right hand weakness, but not progressing, continue to have gait abnormality,   Mother reported few episode of sudden onset generalized weakness, could not walk, left arm against gravity, most recent episode was July 10, 2019, he has got up taking his morning medications, he noticed sudden onset weakness, he was able to call mother to check on it, episode lasted about few hours, gradually improved   Reviewed emergency room presentation on November 20 2018, similar episode on September 24, 2018, during the spells, he was able to move his toes and fingers, but very slowly, laboratory evaluations December showed negative  troponin, normal CMP, with glucose of 99, normal CBC,   UPDATE Jan 15 2020: He is overall doing very well, there was no recurrent generalized tonic-clonic seizure, but he has staring spells, transient unresponsiveness almost daily basis, he no longer has to wear his helmet, continue have right hand weakness, gait difficulty has improved   UPDATE February 18 2020: He was seen by neurosurgeon Dr. Lanis, deemed to be a good candidate for vagal nerve stimulation, however, patient have a tendency to back-and-forth with this decision, all VNS related question was answered, despite polypharmacy large dose of Vimpat  400 mg twice a day, lamotrigine  600 mg daily, he continues to have frequent spells of small seizures, staring into space, unresponsive for 1 minutes,  this is a significant compared to previous frequent drop attacks, he has to wear helmet in the past,   He continue have significant right more than left hand weakness, gait abnormality, he denies significant sensory changes   He came in today for repeat electrodiagnostic study today, which showed 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  I will refer him to fluoroscopy guided lumbar puncture   I again personally reviewed MRIs in 2019, MRI of cervical spine/brain: No acute abnormality, advanced atrophy of cerebellum likely secondary to chronic antielliptic medication therapy, multilevel cervical degenerative changes, evidence of significant canal stenosis, moderate to severe at bilateral C4-5, C5-6, C6-7, C7-T1 MRI of lumbar spine showed mild degenerative changes, no significant canal or foraminal narrowing.   UPDATE Sep 04 2020: His  mother reported 3 episode of patient woke up in the morning feeling body weakness, slurred speech, but was able to communicate clearly his idea, lasting for hours, gradually resolved, there was no loss of consciousness, this is different from any of his spells in the past,   He has no generalized tonic-clonic seizure, taking Vimpat  200 mg 2 tablets twice a day, lamotrigine  xr24 hours 300 mg 2 tablets every night,   UPDATE June 02 2021: He is accompanied by his mother at today's visit, overall doing very well, only he has couple staring off spells each week, tolerating lamotrigine  xr 300 mg 2 tablets every night, Vimpat  200 mg 2 tablets twice a day,   Continue has right hand muscle atrophy, weakness, this started gradually since 2019, no significant progression, no sensory component, 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 Remains on Vimpat  and Lamictal .   UPDATE July 25th 2023 Overall very stable with his current medications, rarely has small seizure, short lasting, continue have right hand atrophy, gait has improved,  Repeat EEG in June 2023 was normal  Currently taking clobazam  10 mg every night, Vimpat  200 mg twice a day, lamotrigine  ER 300 mg 2 tablets every night level was 10.3, lacosamide  level was 10.5, normal CMP CBC,  Update December 28, 2022 SS: Seizures doing well, episodes of short spells, turning head to the right, mom will redirect him, it goes away. Happens few times a month. On onfi  10 mg BID, Vimpat  200  mg BID, will switch to generic, Lamictal  XR 600 mg at bedtime. Labs June 2023 normal CBC CMP, Lamictal  10.3, Vimpat  10.5. Balance is better.   Update July 06, 2023 SS: Here with mom, doing well, seizures very brief, stare off few seconds, becoming less frequent/less intense, bounces back quickly. Getting around okay, no falls. On Onfi  10 mg at bedtime, Vimpat  200 mg BID, Lamictal  300 mg XR 2 at bedtime. On Buspar  15 mg daily 3 times daily. Seeing PCP, doing well, going to get some counseling.   Update March 14, 2024 SS: Labs last visit, CBC, CMP normal. Vimpat  8.3, Lamictal  12.6. Remains on stable dose of Onfi  10 mg at bedtime, Vimpat  200 mg BID, Lamictal  XR 300 mg, 2 tablets daily. Continues with small brief seizure episodes few times a month. He gets pill packs. He went to some anger  management, mom felt not needed. Looking into adult day program. He plays video games. Balance is better.   Update 11/06/24 SS: with his mom, going to start adult day program,  5 days a week. Seizures under good control, continues with few small seizure episodes a month, stiffen up, often when saying blessing before meals., eats too much. Feels right hand getting stronger.   REVIEW OF SYSTEMS: Out of a complete 14 system review of symptoms, the patient complains only of the following symptoms, and all other reviewed systems are negative.  See HPI  ALLERGIES: Allergies  Allergen Reactions   Other     IVIG - hypertension     HOME MEDICATIONS: Outpatient Medications Prior to Visit  Medication Sig Dispense Refill   acetaminophen  (TYLENOL ) 325 MG tablet Take 325 mg by mouth every 6 (six) hours as needed for moderate pain.     amLODipine (NORVASC) 10 MG tablet Take 10 mg by mouth daily.     atorvastatin (LIPITOR) 10 MG tablet Take 10 mg by mouth daily.     busPIRone  (BUSPAR ) 15 MG tablet TAKE 1 TABLET THREE TIMES DAILY 180 tablet 5   Cholecalciferol (VITAMIN D3) 50 MCG (2000 UT) capsule Take 2,000 Units by mouth  daily.     cloBAZam  (ONFI ) 10 MG tablet Take 1 tablet (10 mg total) by mouth at bedtime. 30 tablet 5   Cyanocobalamin (VITAMIN B-12 PO) Take 1 tablet by mouth daily.     docusate sodium (COLACE) 100 MG capsule Take 100 mg by mouth daily as needed for mild constipation.     dutasteride (AVODART) 0.5 MG capsule Take 0.5 mg by mouth every morning.     fluticasone  (FLONASE ) 50 MCG/ACT nasal spray Place 1 spray into both nostrils daily as needed for allergies or rhinitis.     lacosamide  (VIMPAT ) 200 MG TABS tablet Take 1 tablet (200 mg total) by mouth 2 (two) times daily. 186 tablet 3   LamoTRIgine  300 MG TB24 24 hour tablet Take 2 tablets (600 mg total) by mouth at bedtime. 180 tablet 1   losartan (COZAAR) 25 MG tablet Take 25 mg by mouth daily.     tamsulosin (FLOMAX) 0.4 MG CAPS capsule Take 0.4 mg by mouth daily.     UNABLE TO FIND Take 1 tablet by mouth daily. Med Name: [place] + Vitamin C     zinc gluconate 50 MG tablet Take 50 mg by mouth daily.     ciprofloxacin -dexamethasone  (CIPRODEX ) OTIC suspension Place 4 drops into both ears 2 (two) times daily. (Patient not taking: Reported on 11/06/2024) 7.5 mL 0   No facility-administered medications prior to visit.    PAST MEDICAL HISTORY: Past Medical History:  Diagnosis Date   Anxiety    Depression    Seizures (HCC)     PAST SURGICAL HISTORY: Past Surgical History:  Procedure Laterality Date   COLONOSCOPY WITH PROPOFOL  N/A 07/09/2021   Procedure: COLONOSCOPY WITH PROPOFOL ;  Surgeon: Albertus Gordy HERO, MD;  Location: [facility];  Service: Gastroenterology;  Laterality: N/A;   none     POLYPECTOMY  07/09/2021   Procedure: POLYPECTOMY;  Surgeon: Albertus Gordy HERO, MD;  Location: [facility];  Service: Gastroenterology;;    FAMILY HISTORY: Family History  Problem Relation Age of Onset   Asthma Father     SOCIAL HISTORY: Social History   Socioeconomic History   Marital status: Single    Spouse name: Not on file   Number of children:  0   Years of  education: 12   Highest education level: Not on file  Occupational History    Comment: Disabled  Tobacco Use   Smoking status: Former    Current packs/day: 0.50    Average packs/day: 0.5 packs/day for 20.0 years (10.0 ttl pk-yrs)    Types: Cigarettes   Smokeless tobacco: Never  Vaping Use   Vaping status: Never Used  Substance and Sexual Activity   Alcohol use: No    Alcohol/week: 0.0 standard drinks of alcohol   Drug use: No    Comment: 11/08/16 - reports he has stopped smoking marijuana.   Sexual activity: Not on file  Other Topics Concern   Not on file  Social History Narrative   Patient lives at home with his mother Fannie Alomar ). Patient is disabled. Patient has high school education.   Left handed.   Caffeine- soda - pepsi four cans daily.   Patients father died of over dose.-40   Social Drivers of Health   Tobacco Use: Medium Risk (11/06/2024)   Patient History    Smoking Tobacco Use: Former    Smokeless Tobacco Use: Never    Passive Exposure: Not on file  Financial Resource Strain: Not on File (03/11/2022)   Received from [employer]    Financial Resource Strain: 0  Food Insecurity: Not on File (08/18/2023)   Received from [facility] Insecurity    Food: 0  Transportation Needs: Not on File (03/11/2022)   Received from [employer] Needs    Transportation: 0  Physical Activity: Not on File (03/11/2022)   Received from [facility]   Physical Activity    Physical Activity: 0  Stress: Not on File (03/11/2022)   Received from [facility]   Stress    Stress: 0  Social Connections: Not on File (08/06/2023)   Received from [employer]   Social Connections    Connectedness: 0  Intimate Partner Violence: Not on file  Depression (PHQ2-9): Not on file  Alcohol Screen: Not on file  Housing: Not on file  Utilities: Not on file  Health Literacy: Not on file   PHYSICAL EXAM  Vitals:   11/06/24 1404  BP: 130/79  Pulse: 62  Weight:  222 lb (100.7 kg)  Height: 5' 8 (1.727 m)   Body mass index is 33.75 kg/m.  Generalized: Well-appearing, well-dressed Neurological examination  Mentation: Alert oriented to time, place, mom assists with history. Follows all commands speech and language fluent Cranial nerve II-XII: Pupils were equal round reactive to light. Extraocular movements were full, visual field were full on confrontational test. Facial sensation and strength were normal.  Head turning and shoulder shrug were normal and symmetric. Motor: Significant right hand atrophy, decreased strength to right hand with grip strength, right arm strength 3/5, atrophy to right forearm Sensory: Intact to soft touch to face, arms, legs Coordination: Cerebellar testing reveals good finger-nose-finger and heel-to-shin bilaterally.  Gait and station: Needs push-up to get up from sitting position, cautious wide-based, right arm tends to stay by side Reflexes: Areflexia  DIAGNOSTIC DATA (LABS, IMAGING, TESTING) - I reviewed patient records, labs, notes, testing and imaging myself where available.  Lab Results  Component Value Date   WBC 6.1 07/06/2023   HGB 15.1 07/06/2023   HCT 44.4 07/06/2023   MCV 83 07/06/2023   PLT 253 07/06/2023      Component Value Date/Time   NA 144 07/06/2023 1509   K 4.5 07/06/2023 1509   CL 108 (H)  07/06/2023 1509   CO2 22 07/06/2023 1509   GLUCOSE 91 07/06/2023 1509   GLUCOSE 81 05/21/2021 1123   BUN 10 07/06/2023 1509   CREATININE 1.17 07/06/2023 1509   CALCIUM 9.2 07/06/2023 1509   PROT 7.2 07/06/2023 1509   ALBUMIN 4.3 07/06/2023 1509   AST 16 07/06/2023 1509   ALT 29 07/06/2023 1509   ALKPHOS 88 07/06/2023 1509   BILITOT 0.4 07/06/2023 1509   GFRNONAA 69 09/04/2020 1205   GFRAA 80 09/04/2020 1205   No results found for: CHOL, HDL, LDLCALC, LDLDIRECT, TRIG, CHOLHDL Lab Results  Component Value Date   HGBA1C 5.9 (H) 09/04/2020   Lab Results  Component Value Date    VITAMINB12 310 07/12/2019   Lab Results  Component Value Date   TSH 1.240 09/04/2020   ASSESSMENT AND PLAN 53 y.o. 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 Epilepsy, intellectual disability  -Doing very well, less frequent spells, shorter in duration, tolerating seizure medications, still has a few small spells monthly -Continue lamotrigine  ER 300 mg, 2 tablets at bedtime -Continue lacosamide  200 mg, 1 tablets twice a day -Continue to Onfi  10mg  at bedtime -Has previously seen Dr. Lanis, determined candidate for VNS, he is not interested  -Check labs today   3.  Mood disorder -Stable on BuSpar  15 mg 3 times a day, PCP has referred to counseling  Follow-up with Dr. Onita in 1 year, call for increase in seizure activity  Meds ordered this encounter  Medications   LamoTRIgine  300 MG TB24 24 hour tablet    Sig: Take 2 tablets (600 mg total) by mouth at bedtime.    Dispense:  180 tablet    Refill:  3   cloBAZam  (ONFI ) 10 MG tablet    Sig: Take 1 tablet (10 mg total) by mouth at bedtime.    Dispense:  30 tablet    Refill:  5   lacosamide  (VIMPAT ) 200 MG TABS tablet    Sig: Take 1 tablet (200 mg total) by mouth 2 (two) times daily.    Dispense:  180 tablet    Refill:  1   Lauraine Gayland MANDES, DNP  [facility] [address] [place], [place] [address] [phone]

## 2024-11-06 NOTE — Patient Instructions (Signed)
 Great to see you today! Continue current medications Check labs Call for increase in seizure activity Follow-up in 1 year.  Thanks!!

## 2024-11-08 ENCOUNTER — Ambulatory Visit: Payer: Self-pay | Admitting: Neurology

## 2024-11-08 LAB — COMPREHENSIVE METABOLIC PANEL WITH GFR
ALT: 42 IU/L (ref 0–44)
AST: 18 IU/L (ref 0–40)
Albumin: 4.4 g/dL (ref 3.8–4.9)
Alkaline Phosphatase: 81 IU/L (ref 47–123)
BUN/Creatinine Ratio: 10 (ref 9–20)
BUN: 12 mg/dL (ref 6–24)
Bilirubin Total: 0.5 mg/dL (ref 0.0–1.2)
CO2: 23 mmol/L (ref 20–29)
Calcium: 9.6 mg/dL (ref 8.7–10.2)
Chloride: 104 mmol/L (ref 96–106)
Creatinine, Ser: 1.17 mg/dL (ref 0.76–1.27)
Globulin, Total: 3.1 g/dL (ref 1.5–4.5)
Glucose: 81 mg/dL (ref 70–99)
Potassium: 4.3 mmol/L (ref 3.5–5.2)
Sodium: 143 mmol/L (ref 134–144)
Total Protein: 7.5 g/dL (ref 6.0–8.5)
eGFR: 75 mL/min/1.73 (ref >59)

## 2024-11-08 LAB — CBC WITH DIFFERENTIAL/PLATELET
Basophils Absolute: 0.1 x10E3/uL (ref 0.0–0.2)
Basos: 1 %
EOS (ABSOLUTE): 0.3 x10E3/uL (ref 0.0–0.4)
Eos: 5 %
Hematocrit: 45.3 % (ref 37.5–51.0)
Hemoglobin: 15.2 g/dL (ref 13.0–17.7)
Immature Grans (Abs): 0 x10E3/uL (ref 0.0–0.1)
Immature Granulocytes: 0 %
Lymphocytes Absolute: 2.9 x10E3/uL (ref 0.7–3.1)
Lymphs: 44 %
MCH: 28 pg (ref 26.6–33.0)
MCHC: 33.6 g/dL (ref 31.5–35.7)
MCV: 83 fL (ref 79–97)
Monocytes Absolute: 0.5 x10E3/uL (ref 0.1–0.9)
Monocytes: 8 %
Neutrophils Absolute: 2.8 x10E3/uL (ref 1.4–7.0)
Neutrophils: 42 %
Platelets: 256 x10E3/uL (ref 150–450)
RBC: 5.43 x10E6/uL (ref 4.14–5.80)
RDW: 13.5 % (ref 11.6–15.4)
WBC: 6.6 x10E3/uL (ref 3.4–10.8)

## 2024-11-08 LAB — LAMOTRIGINE LEVEL: Lamotrigine Lvl: 13.2 ug/mL (ref 2.0–20.0)

## 2024-11-08 LAB — LACOSAMIDE: Lacosamide: 8.6 ug/mL (ref 5.0–10.0)

## 2025-11-05 ENCOUNTER — Ambulatory Visit: Admitting: Neurology
